# Patient Record
Sex: Female | Born: 1935 | Race: White | Hispanic: No | Marital: Married | Smoking: Never smoker
Health system: Southern US, Community
[De-identification: ages and names within clinical notes are randomized; demographics above are authoritative.]

## PROBLEM LIST (undated history)

## (undated) DIAGNOSIS — M1990 Unspecified osteoarthritis, unspecified site: Secondary | ICD-10-CM

## (undated) DIAGNOSIS — E119 Type 2 diabetes mellitus without complications: Secondary | ICD-10-CM

## (undated) DIAGNOSIS — E039 Hypothyroidism, unspecified: Secondary | ICD-10-CM

## (undated) DIAGNOSIS — I10 Essential (primary) hypertension: Secondary | ICD-10-CM

## (undated) DIAGNOSIS — J45909 Unspecified asthma, uncomplicated: Secondary | ICD-10-CM

## (undated) HISTORY — PX: OTHER SURGICAL HISTORY: SHX169

## (undated) HISTORY — DX: Unspecified osteoarthritis, unspecified site: M19.90

---

## 2002-02-18 ENCOUNTER — Encounter: Payer: Self-pay | Admitting: Neurosurgery

## 2002-02-24 ENCOUNTER — Encounter: Payer: Self-pay | Admitting: Neurosurgery

## 2002-02-24 ENCOUNTER — Inpatient Hospital Stay (HOSPITAL_COMMUNITY): Admission: RE | Admit: 2002-02-24 | Discharge: 2002-02-26 | Payer: Self-pay | Admitting: Neurosurgery

## 2002-04-08 ENCOUNTER — Encounter: Admission: RE | Admit: 2002-04-08 | Discharge: 2002-04-08 | Payer: Self-pay | Admitting: Neurosurgery

## 2002-04-08 ENCOUNTER — Encounter: Payer: Self-pay | Admitting: Neurosurgery

## 2002-05-17 ENCOUNTER — Encounter: Payer: Self-pay | Admitting: Unknown Physician Specialty

## 2002-05-17 ENCOUNTER — Encounter: Admission: RE | Admit: 2002-05-17 | Discharge: 2002-05-17 | Payer: Self-pay | Admitting: Unknown Physician Specialty

## 2002-05-18 ENCOUNTER — Encounter: Admission: RE | Admit: 2002-05-18 | Discharge: 2002-05-18 | Payer: Self-pay | Admitting: Neurosurgery

## 2002-05-18 ENCOUNTER — Encounter: Payer: Self-pay | Admitting: Neurosurgery

## 2004-11-15 ENCOUNTER — Encounter: Payer: Self-pay | Admitting: Podiatry

## 2004-12-06 ENCOUNTER — Encounter: Payer: Self-pay | Admitting: Podiatry

## 2005-05-21 ENCOUNTER — Ambulatory Visit: Payer: Self-pay | Admitting: Unknown Physician Specialty

## 2005-07-27 ENCOUNTER — Ambulatory Visit: Payer: Self-pay | Admitting: General Practice

## 2005-07-27 ENCOUNTER — Other Ambulatory Visit: Payer: Self-pay

## 2005-08-02 ENCOUNTER — Ambulatory Visit: Payer: Self-pay | Admitting: General Practice

## 2005-11-29 ENCOUNTER — Ambulatory Visit: Payer: Self-pay | Admitting: Unknown Physician Specialty

## 2006-05-30 ENCOUNTER — Ambulatory Visit: Payer: Self-pay | Admitting: Unknown Physician Specialty

## 2006-06-10 ENCOUNTER — Ambulatory Visit: Payer: Self-pay | Admitting: Unknown Physician Specialty

## 2006-07-02 ENCOUNTER — Encounter: Admission: RE | Admit: 2006-07-02 | Discharge: 2006-07-02 | Payer: Self-pay | Admitting: Neurosurgery

## 2006-08-19 ENCOUNTER — Ambulatory Visit: Payer: Self-pay | Admitting: Internal Medicine

## 2006-12-18 ENCOUNTER — Ambulatory Visit: Payer: Self-pay | Admitting: Pain Medicine

## 2006-12-19 ENCOUNTER — Ambulatory Visit: Payer: Self-pay | Admitting: Pain Medicine

## 2006-12-30 ENCOUNTER — Ambulatory Visit: Payer: Self-pay | Admitting: Physician Assistant

## 2007-01-07 ENCOUNTER — Ambulatory Visit: Payer: Self-pay | Admitting: Physician Assistant

## 2007-03-10 ENCOUNTER — Encounter: Admission: RE | Admit: 2007-03-10 | Discharge: 2007-03-10 | Payer: Self-pay | Admitting: Internal Medicine

## 2007-03-19 ENCOUNTER — Ambulatory Visit: Payer: Self-pay | Admitting: Unknown Physician Specialty

## 2007-03-19 ENCOUNTER — Other Ambulatory Visit: Payer: Self-pay

## 2007-03-25 ENCOUNTER — Inpatient Hospital Stay: Payer: Self-pay | Admitting: Unknown Physician Specialty

## 2007-03-27 ENCOUNTER — Other Ambulatory Visit: Payer: Self-pay

## 2007-06-04 ENCOUNTER — Ambulatory Visit: Payer: Self-pay | Admitting: Unknown Physician Specialty

## 2008-06-08 ENCOUNTER — Ambulatory Visit: Payer: Self-pay | Admitting: Unknown Physician Specialty

## 2008-06-22 ENCOUNTER — Emergency Department: Payer: Self-pay | Admitting: Emergency Medicine

## 2008-07-05 ENCOUNTER — Ambulatory Visit: Payer: Self-pay | Admitting: Physician Assistant

## 2008-07-16 ENCOUNTER — Other Ambulatory Visit: Payer: Self-pay

## 2008-07-16 ENCOUNTER — Ambulatory Visit: Payer: Self-pay | Admitting: Unknown Physician Specialty

## 2008-07-22 ENCOUNTER — Inpatient Hospital Stay: Payer: Self-pay | Admitting: Unknown Physician Specialty

## 2008-11-23 ENCOUNTER — Ambulatory Visit: Payer: Self-pay | Admitting: Unknown Physician Specialty

## 2008-12-02 ENCOUNTER — Ambulatory Visit: Payer: Self-pay | Admitting: Unknown Physician Specialty

## 2008-12-06 ENCOUNTER — Inpatient Hospital Stay: Payer: Self-pay | Admitting: Unknown Physician Specialty

## 2009-06-15 ENCOUNTER — Ambulatory Visit: Payer: Self-pay | Admitting: Internal Medicine

## 2009-07-20 ENCOUNTER — Ambulatory Visit: Payer: Self-pay | Admitting: Ophthalmology

## 2010-06-16 ENCOUNTER — Ambulatory Visit: Payer: Self-pay | Admitting: Internal Medicine

## 2011-06-29 ENCOUNTER — Ambulatory Visit: Payer: Self-pay | Admitting: Internal Medicine

## 2011-08-29 ENCOUNTER — Emergency Department: Payer: Self-pay | Admitting: *Deleted

## 2012-03-08 ENCOUNTER — Emergency Department: Payer: Self-pay | Admitting: Emergency Medicine

## 2012-07-03 ENCOUNTER — Ambulatory Visit: Payer: Self-pay | Admitting: Internal Medicine

## 2012-08-02 ENCOUNTER — Emergency Department: Payer: Self-pay | Admitting: Emergency Medicine

## 2012-08-03 ENCOUNTER — Inpatient Hospital Stay: Payer: Self-pay | Admitting: Internal Medicine

## 2012-08-03 LAB — TROPONIN I: Troponin-I: <0.02 ng/mL

## 2012-08-03 LAB — CBC
HCT: 40 % (ref 35.0–47.0)
HCT: 38.8 % (ref 35.0–47.0)
HGB: 13.2 g/dL (ref 12.0–16.0)
HGB: 12.9 g/dL (ref 12.0–16.0)
MCHC: 33 g/dL (ref 32.0–36.0)
MCHC: 33.2 g/dL (ref 32.0–36.0)
RBC: 4.22 10*6/uL (ref 3.80–5.20)
RBC: 4.08 10*6/uL (ref 3.80–5.20)
WBC: 11.1 10*3/uL — ABNORMAL HIGH (ref 3.6–11.0)
WBC: 9.6 10*3/uL (ref 3.6–11.0)

## 2012-08-03 LAB — LIPASE, BLOOD: Lipase: 92 U/L (ref 73–393)

## 2012-08-03 LAB — PROTIME-INR
INR: 1
Prothrombin Time: 13.7 secs (ref 11.5–14.7)

## 2012-08-03 LAB — COMPREHENSIVE METABOLIC PANEL
Anion Gap: 9 (ref 7–16)
BUN: 31 mg/dL — ABNORMAL HIGH (ref 7–18)
Bilirubin,Total: 0.4 mg/dL (ref 0.2–1.0)
Chloride: 101 mmol/L (ref 98–107)
Creatinine: 1.64 mg/dL — ABNORMAL HIGH (ref 0.60–1.30)
Osmolality: 284 (ref 275–301)
Potassium: 4.7 mmol/L (ref 3.5–5.1)
Total Protein: 7.6 g/dL (ref 6.4–8.2)

## 2012-08-03 LAB — CK TOTAL AND CKMB (NOT AT ARMC): CK-MB: 1.2 ng/mL (ref 0.5–3.6)

## 2012-08-04 LAB — CBC WITH DIFFERENTIAL/PLATELET
Basophil %: 0.6 %
HCT: 34.6 % — ABNORMAL LOW (ref 35.0–47.0)
HGB: 11.7 g/dL — ABNORMAL LOW (ref 12.0–16.0)
Lymphocyte %: 25.3 %
MCHC: 34 g/dL (ref 32.0–36.0)
MCV: 95 fL (ref 80–100)
Monocyte %: 7.7 %
Neutrophil #: 5 10*3/uL (ref 1.4–6.5)
WBC: 7.9 10*3/uL (ref 3.6–11.0)

## 2012-08-04 LAB — URINALYSIS, COMPLETE
Bilirubin,UR: NEGATIVE
Glucose,UR: NEGATIVE mg/dL (ref 0–75)
Hyaline Cast: 7
Nitrite: NEGATIVE
RBC,UR: 2 /HPF (ref 0–5)
Squamous Epithelial: 5
WBC UR: 4 /HPF (ref 0–5)

## 2012-08-04 LAB — BASIC METABOLIC PANEL
BUN: 25 mg/dL — ABNORMAL HIGH (ref 7–18)
Creatinine: 1.23 mg/dL (ref 0.60–1.30)
EGFR (African American): 46 — ABNORMAL LOW
EGFR (Non-African Amer.): 40 — ABNORMAL LOW
Glucose: 128 mg/dL — ABNORMAL HIGH (ref 65–99)
Sodium: 144 mmol/L (ref 136–145)

## 2012-08-04 LAB — HEMOGLOBIN A1C: Hemoglobin A1C: 6.8 % — ABNORMAL HIGH (ref 4.2–6.3)

## 2012-08-05 LAB — BASIC METABOLIC PANEL
BUN: 17 mg/dL (ref 7–18)
Chloride: 111 mmol/L — ABNORMAL HIGH (ref 98–107)
Creatinine: 0.98 mg/dL (ref 0.60–1.30)
Potassium: 3.5 mmol/L (ref 3.5–5.1)

## 2012-08-05 LAB — CBC WITH DIFFERENTIAL/PLATELET
Basophil %: 0.7 %
Eosinophil %: 3.7 %
HGB: 11.2 g/dL — ABNORMAL LOW (ref 12.0–16.0)
MCH: 31.2 pg (ref 26.0–34.0)
MCV: 95 fL (ref 80–100)
Monocyte #: 0.4 x10 3/mm (ref 0.2–0.9)
Neutrophil %: 72.5 %
RBC: 3.59 10*6/uL — ABNORMAL LOW (ref 3.80–5.20)

## 2012-08-06 LAB — CBC WITH DIFFERENTIAL/PLATELET
Basophil #: 0.1 10*3/uL (ref 0.0–0.1)
Eosinophil %: 4.7 %
Lymphocyte #: 1.7 10*3/uL (ref 1.0–3.6)
MCH: 32.1 pg (ref 26.0–34.0)
MCV: 95 fL (ref 80–100)
Monocyte #: 0.5 x10 3/mm (ref 0.2–0.9)
Neutrophil %: 61.5 %
Platelet: 162 10*3/uL (ref 150–440)
RDW: 13.1 % (ref 11.5–14.5)

## 2012-08-06 LAB — BASIC METABOLIC PANEL
Anion Gap: 10 (ref 7–16)
Calcium, Total: 8 mg/dL — ABNORMAL LOW (ref 8.5–10.1)
Co2: 21 mmol/L (ref 21–32)
Creatinine: 0.88 mg/dL (ref 0.60–1.30)
EGFR (African American): >60
Osmolality: 283 (ref 275–301)

## 2012-08-06 LAB — CLOSTRIDIUM DIFFICILE BY PCR

## 2012-08-07 LAB — BASIC METABOLIC PANEL
BUN: 7 mg/dL (ref 7–18)
Co2: 22 mmol/L (ref 21–32)
Creatinine: 0.98 mg/dL (ref 0.60–1.30)
EGFR (African American): >60
EGFR (Non-African Amer.): 52 — ABNORMAL LOW
Potassium: 3.5 mmol/L (ref 3.5–5.1)
Sodium: 147 mmol/L — ABNORMAL HIGH (ref 136–145)

## 2012-08-07 LAB — WBCS, STOOL

## 2012-08-07 LAB — CBC WITH DIFFERENTIAL/PLATELET
Basophil #: 0.1 10*3/uL (ref 0.0–0.1)
Eosinophil %: 4.6 %
HCT: 35 % (ref 35.0–47.0)
HGB: 11.8 g/dL — ABNORMAL LOW (ref 12.0–16.0)
Lymphocyte %: 31.4 %
Monocyte %: 8.5 %
Neutrophil #: 3.9 10*3/uL (ref 1.4–6.5)
RBC: 3.75 10*6/uL — ABNORMAL LOW (ref 3.80–5.20)
RDW: 13.2 % (ref 11.5–14.5)
WBC: 7.1 10*3/uL (ref 3.6–11.0)

## 2013-02-27 ENCOUNTER — Inpatient Hospital Stay: Payer: Self-pay | Admitting: Internal Medicine

## 2013-02-27 LAB — OCCULT BLOOD X 1 CARD TO LAB, STOOL: Occult Blood, Feces: POSITIVE

## 2013-02-27 LAB — CBC
HCT: 35.2 % (ref 35.0–47.0)
MCH: 30.8 pg (ref 26.0–34.0)
MCHC: 32.8 g/dL (ref 32.0–36.0)
MCHC: 32 g/dL (ref 32.0–36.0)
MCV: 96 fL (ref 80–100)
RBC: 3.37 10*6/uL — ABNORMAL LOW (ref 3.80–5.20)
RDW: 13.5 % (ref 11.5–14.5)
WBC: 11.5 10*3/uL — ABNORMAL HIGH (ref 3.6–11.0)
WBC: 9 10*3/uL (ref 3.6–11.0)

## 2013-02-27 LAB — COMPREHENSIVE METABOLIC PANEL
Albumin: 3.3 g/dL — ABNORMAL LOW (ref 3.4–5.0)
Alkaline Phosphatase: 55 U/L (ref 50–136)
BUN: 44 mg/dL — ABNORMAL HIGH (ref 7–18)
Calcium, Total: 9.1 mg/dL (ref 8.5–10.1)
Creatinine: 2.33 mg/dL — ABNORMAL HIGH (ref 0.60–1.30)
EGFR (Non-African Amer.): 18 — ABNORMAL LOW
Potassium: 4.5 mmol/L (ref 3.5–5.1)
SGOT(AST): 19 U/L (ref 15–37)
SGPT (ALT): 19 U/L (ref 12–78)

## 2013-02-27 LAB — URINALYSIS, COMPLETE
Blood: NEGATIVE
Glucose,UR: NEGATIVE mg/dL (ref 0–75)
Leukocyte Esterase: NEGATIVE
Nitrite: NEGATIVE
Ph: 5 (ref 4.5–8.0)
Protein: NEGATIVE
RBC,UR: 1 /HPF (ref 0–5)
WBC UR: 1 /HPF (ref 0–5)

## 2013-02-27 LAB — TROPONIN I: Troponin-I: <0.02 ng/mL

## 2013-02-27 LAB — CLOSTRIDIUM DIFFICILE BY PCR

## 2013-02-28 LAB — CBC WITH DIFFERENTIAL/PLATELET
Basophil #: 0 10*3/uL (ref 0.0–0.1)
HCT: 32.2 % — ABNORMAL LOW (ref 35.0–47.0)
HGB: 10.4 g/dL — ABNORMAL LOW (ref 12.0–16.0)
Lymphocyte #: 1.8 10*3/uL (ref 1.0–3.6)
Lymphocyte %: 21.5 %
MCH: 31 pg (ref 26.0–34.0)
MCHC: 32.2 g/dL (ref 32.0–36.0)
Monocyte %: 6 %
Neutrophil %: 67.6 %
RBC: 3.34 10*6/uL — ABNORMAL LOW (ref 3.80–5.20)
RDW: 13 % (ref 11.5–14.5)

## 2013-02-28 LAB — BASIC METABOLIC PANEL
Anion Gap: 9 (ref 7–16)
BUN: 23 mg/dL — ABNORMAL HIGH (ref 7–18)
Chloride: 112 mmol/L — ABNORMAL HIGH (ref 98–107)
Co2: 21 mmol/L (ref 21–32)
Potassium: 4.2 mmol/L (ref 3.5–5.1)
Sodium: 142 mmol/L (ref 136–145)

## 2013-02-28 LAB — TSH: Thyroid Stimulating Horm: 0.697 u[IU]/mL

## 2013-02-28 LAB — HEMOGLOBIN: HGB: 10.9 g/dL — ABNORMAL LOW (ref 12.0–16.0)

## 2013-02-28 LAB — TROPONIN I: Troponin-I: <0.02 ng/mL

## 2013-03-01 LAB — CBC WITH DIFFERENTIAL/PLATELET
Eosinophil #: 0.5 10*3/uL (ref 0.0–0.7)
HCT: 30.9 % — ABNORMAL LOW (ref 35.0–47.0)
HGB: 10.2 g/dL — ABNORMAL LOW (ref 12.0–16.0)
Lymphocyte #: 1.8 10*3/uL (ref 1.0–3.6)
Lymphocyte %: 28.9 %
MCHC: 32.9 g/dL (ref 32.0–36.0)
MCV: 95 fL (ref 80–100)
Monocyte #: 0.5 x10 3/mm (ref 0.2–0.9)
RBC: 3.24 10*6/uL — ABNORMAL LOW (ref 3.80–5.20)
WBC: 6.2 10*3/uL (ref 3.6–11.0)

## 2013-03-01 LAB — BASIC METABOLIC PANEL
Anion Gap: 6 — ABNORMAL LOW (ref 7–16)
Co2: 23 mmol/L (ref 21–32)
EGFR (Non-African Amer.): >60
Glucose: 116 mg/dL — ABNORMAL HIGH (ref 65–99)

## 2013-03-02 LAB — CBC WITH DIFFERENTIAL/PLATELET
Basophil #: 0 10*3/uL (ref 0.0–0.1)
Basophil %: 0.7 %
Eosinophil %: 8.2 %
HGB: 10.9 g/dL — ABNORMAL LOW (ref 12.0–16.0)
Lymphocyte #: 1.3 10*3/uL (ref 1.0–3.6)
MCV: 95 fL (ref 80–100)
Monocyte #: 0.6 x10 3/mm (ref 0.2–0.9)
Neutrophil #: 3.5 10*3/uL (ref 1.4–6.5)
Neutrophil %: 59.3 %
Platelet: 156 10*3/uL (ref 150–440)
WBC: 5.9 10*3/uL (ref 3.6–11.0)

## 2013-03-02 LAB — BASIC METABOLIC PANEL
BUN: 11 mg/dL (ref 7–18)
Calcium, Total: 8.5 mg/dL (ref 8.5–10.1)
Co2: 25 mmol/L (ref 21–32)
EGFR (Non-African Amer.): >60
Osmolality: 287 (ref 275–301)
Potassium: 4.1 mmol/L (ref 3.5–5.1)
Sodium: 144 mmol/L (ref 136–145)

## 2013-03-05 LAB — CULTURE, BLOOD (SINGLE)

## 2013-07-08 ENCOUNTER — Ambulatory Visit: Payer: Self-pay | Admitting: Internal Medicine

## 2013-07-17 ENCOUNTER — Ambulatory Visit: Payer: Self-pay | Admitting: Internal Medicine

## 2013-11-19 IMAGING — CR FACIAL BONES - 1-2 VIEW
1 series · 3 of 3 positions shown · non-contrast
Comparison: none

REASON FOR EXAM: pain & swelling
COMMENTS:   May transport without cardiac monitor

PROCEDURE:     DXR - DXR FACIAL BONES LIMITED  - August 02, 2012 [DATE]
RESULT:     Findings: 3 views of the left facial bones demonstrates no
fracture. The orbital floors are intact. There the paranasal sinuses are
clear.

[Series 1: waters · 0.17mm/px · 3 of 3 slices shown]
[im 1/3]
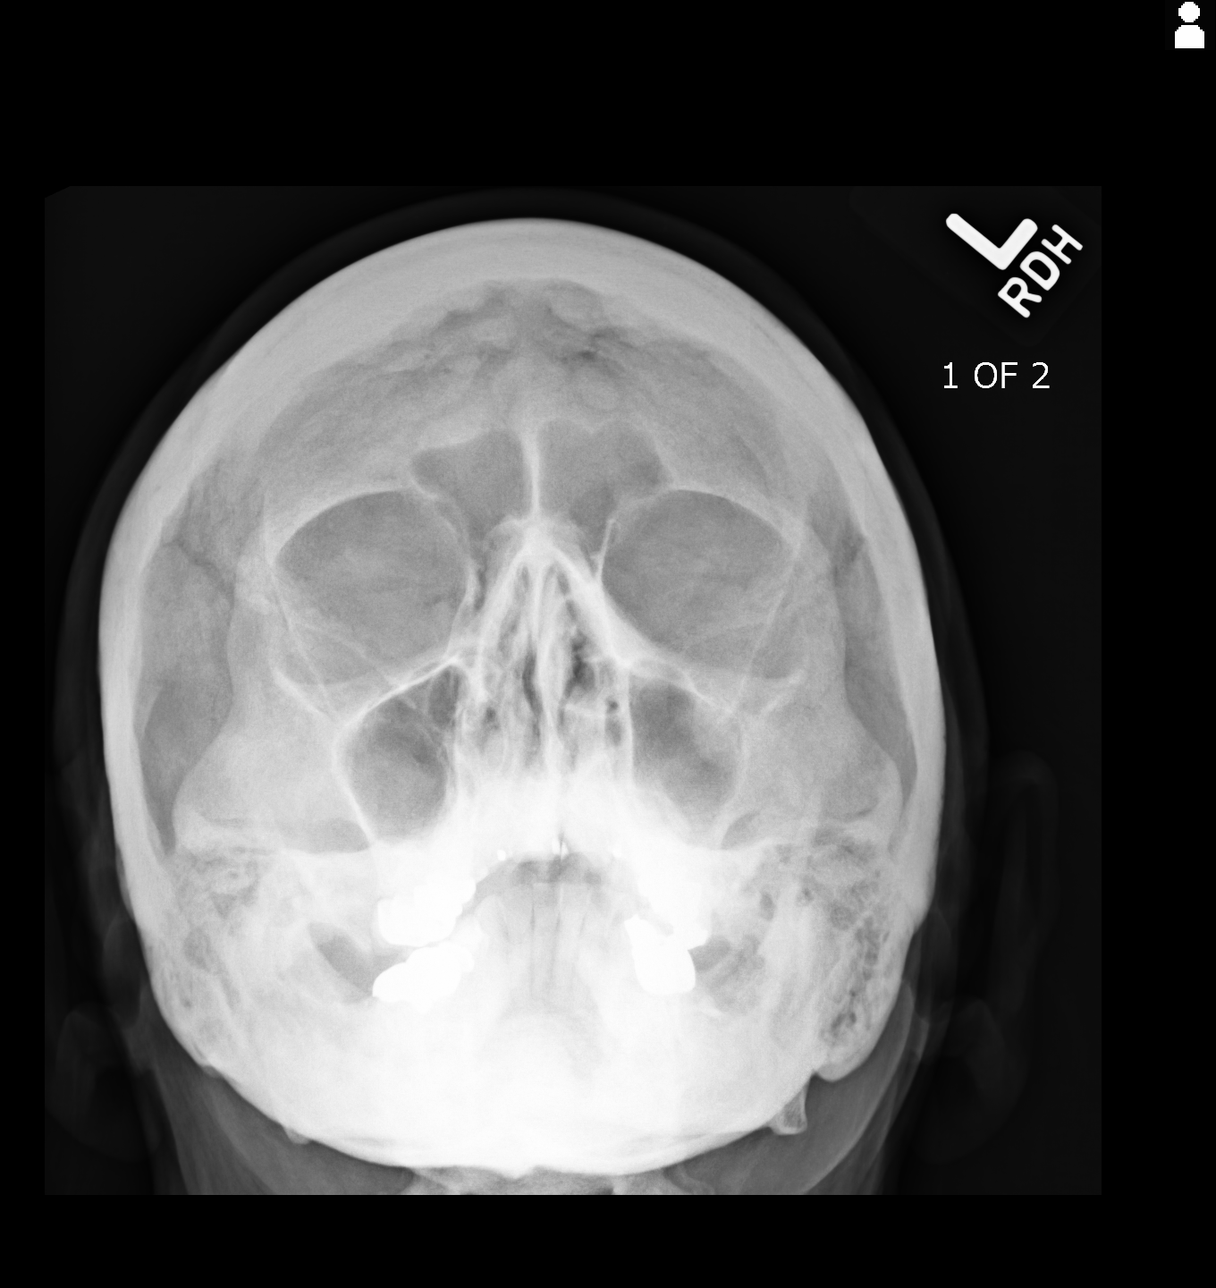
[im 2/3]
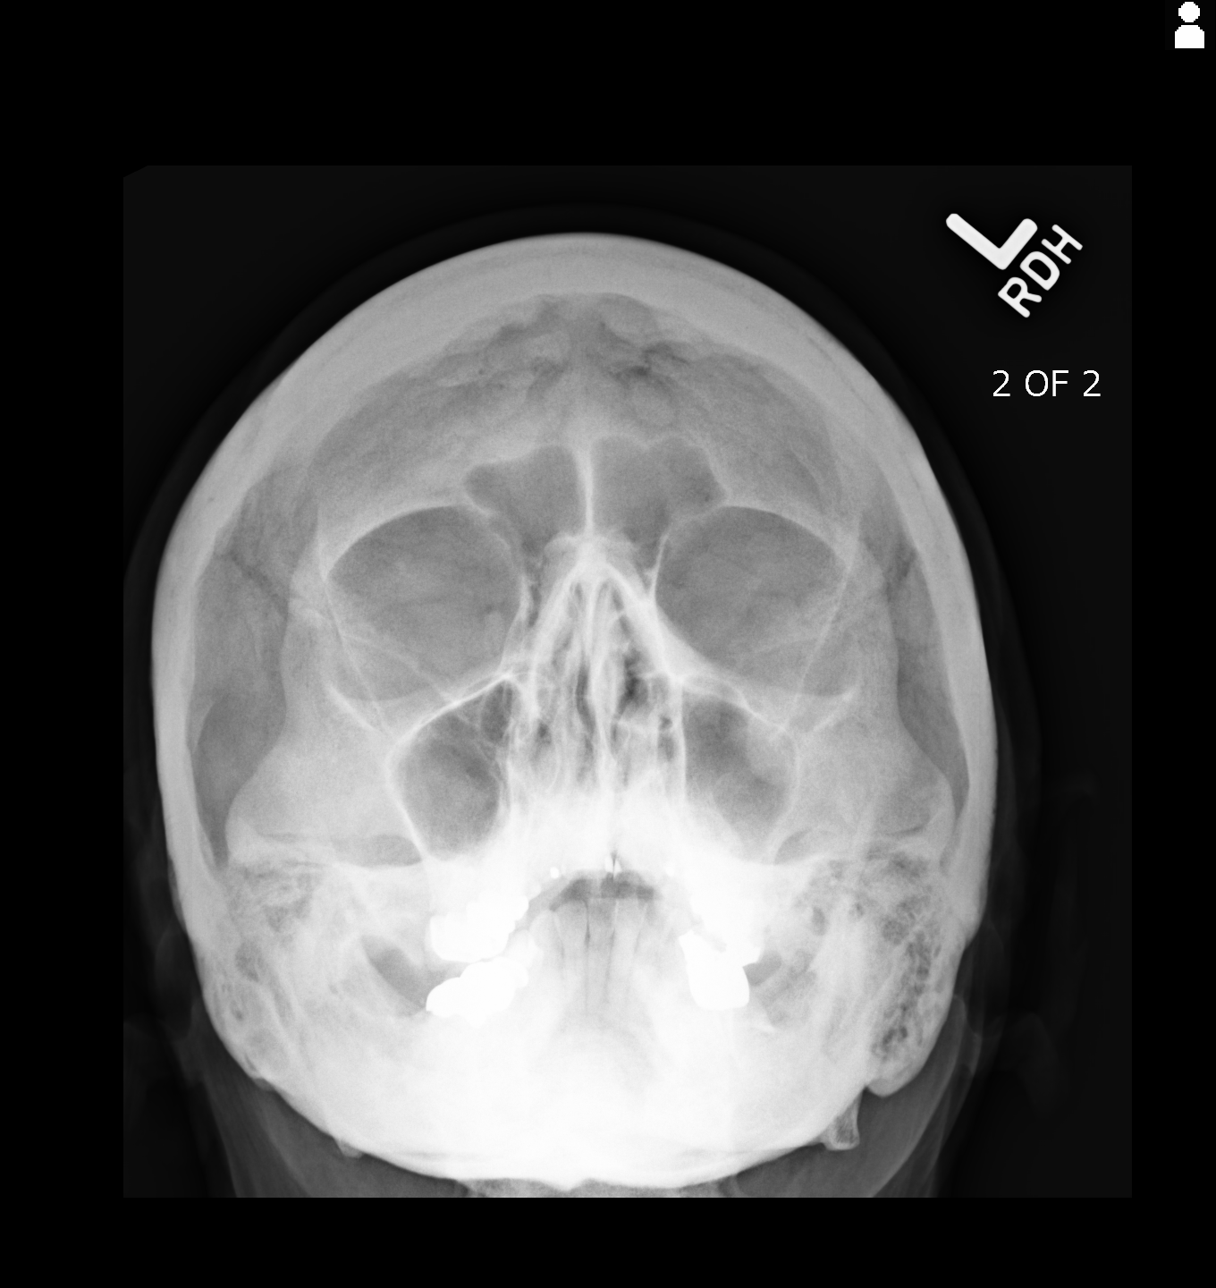
[im 3/3]
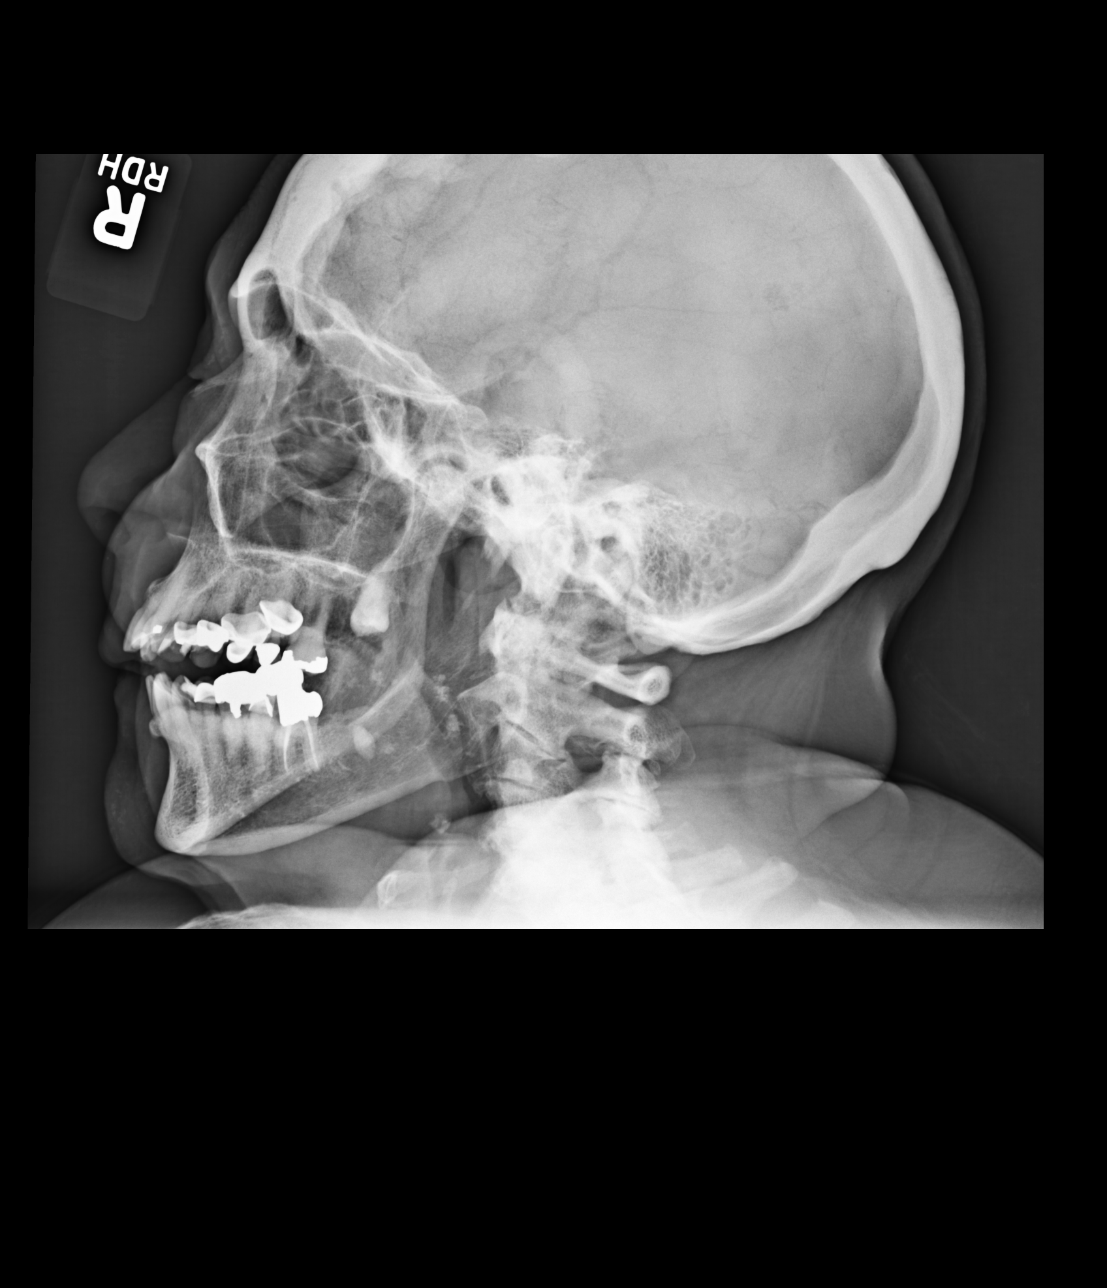

[3 of 3 positions shown; findings below may reference images not displayed]

IMPRESSION: No acute osseous injury of the facial bones. If there is further clinical
concern recommend CT of the maxillofacial bones.

## 2014-03-21 DIAGNOSIS — E785 Hyperlipidemia, unspecified: Secondary | ICD-10-CM | POA: Insufficient documentation

## 2014-03-21 DIAGNOSIS — E1122 Type 2 diabetes mellitus with diabetic chronic kidney disease: Secondary | ICD-10-CM | POA: Insufficient documentation

## 2014-03-21 DIAGNOSIS — N183 Chronic kidney disease, stage 3 unspecified: Secondary | ICD-10-CM | POA: Insufficient documentation

## 2014-03-21 DIAGNOSIS — E039 Hypothyroidism, unspecified: Secondary | ICD-10-CM | POA: Insufficient documentation

## 2014-08-09 ENCOUNTER — Inpatient Hospital Stay: Payer: Self-pay | Admitting: Internal Medicine

## 2014-08-09 LAB — URINALYSIS, COMPLETE
BILIRUBIN, UR: NEGATIVE
Bacteria: NONE SEEN
Blood: NEGATIVE
Glucose,UR: NEGATIVE mg/dL (ref 0–75)
Hyaline Cast: 22
Ketone: NEGATIVE
LEUKOCYTE ESTERASE: NEGATIVE
Nitrite: NEGATIVE
Ph: 5 (ref 4.5–8.0)
Protein: NEGATIVE
RBC,UR: 1 /HPF (ref 0–5)
Specific Gravity: 1.015 (ref 1.003–1.030)
Squamous Epithelial: NONE SEEN
WBC UR: NONE SEEN /HPF (ref 0–5)

## 2014-08-09 LAB — BASIC METABOLIC PANEL
Anion Gap: 10 (ref 7–16)
BUN: 39 mg/dL — ABNORMAL HIGH (ref 7–18)
CHLORIDE: 95 mmol/L — AB (ref 98–107)
Calcium, Total: 9 mg/dL (ref 8.5–10.1)
Co2: 26 mmol/L (ref 21–32)
Creatinine: 2.35 mg/dL — ABNORMAL HIGH (ref 0.60–1.30)
EGFR (Non-African Amer.): 21 — ABNORMAL LOW
GFR CALC AF AMER: 25 — AB
Glucose: 228 mg/dL — ABNORMAL HIGH (ref 65–99)
OSMOLALITY: 279 (ref 275–301)
POTASSIUM: 5 mmol/L (ref 3.5–5.1)
SODIUM: 131 mmol/L — AB (ref 136–145)

## 2014-08-09 LAB — TROPONIN I
Troponin-I: <0.02 ng/mL
Troponin-I: <0.02 ng/mL

## 2014-08-09 LAB — CK TOTAL AND CKMB (NOT AT ARMC)
CK, Total: 44 U/L
CK, Total: 43 U/L
CK-MB: 1 ng/mL (ref 0.5–3.6)
CK-MB: 1.2 ng/mL (ref 0.5–3.6)

## 2014-08-09 LAB — CBC
HCT: 39.4 % (ref 35.0–47.0)
HGB: 13.1 g/dL (ref 12.0–16.0)
MCH: 32 pg (ref 26.0–34.0)
MCHC: 33.2 g/dL (ref 32.0–36.0)
MCV: 97 fL (ref 80–100)
Platelet: 189 10*3/uL (ref 150–440)
RBC: 4.08 10*6/uL (ref 3.80–5.20)
RDW: 13 % (ref 11.5–14.5)
WBC: 10.4 10*3/uL (ref 3.6–11.0)

## 2014-08-10 LAB — CBC WITH DIFFERENTIAL/PLATELET
BASOS ABS: 0 10*3/uL (ref 0.0–0.1)
BASOS PCT: 0.2 %
Eosinophil #: 0 10*3/uL (ref 0.0–0.7)
Eosinophil %: 0.2 %
HCT: 31.9 % — AB (ref 35.0–47.0)
HGB: 10.3 g/dL — ABNORMAL LOW (ref 12.0–16.0)
LYMPHS ABS: 1.2 10*3/uL (ref 1.0–3.6)
LYMPHS PCT: 15.2 %
MCH: 31.4 pg (ref 26.0–34.0)
MCHC: 32.2 g/dL (ref 32.0–36.0)
MCV: 98 fL (ref 80–100)
Monocyte #: 0.6 x10 3/mm (ref 0.2–0.9)
Monocyte %: 6.9 %
NEUTROS PCT: 77.5 %
Neutrophil #: 6.4 10*3/uL (ref 1.4–6.5)
Platelet: 130 10*3/uL — ABNORMAL LOW (ref 150–440)
RBC: 3.26 10*6/uL — ABNORMAL LOW (ref 3.80–5.20)
RDW: 13.4 % (ref 11.5–14.5)
WBC: 8.2 10*3/uL (ref 3.6–11.0)

## 2014-08-10 LAB — BASIC METABOLIC PANEL
ANION GAP: 5 — AB (ref 7–16)
BUN: 19 mg/dL — ABNORMAL HIGH (ref 7–18)
CALCIUM: 6.8 mg/dL — AB (ref 8.5–10.1)
CO2: 24 mmol/L (ref 21–32)
Chloride: 112 mmol/L — ABNORMAL HIGH (ref 98–107)
Creatinine: 1.28 mg/dL (ref 0.60–1.30)
EGFR (African American): 51 — ABNORMAL LOW
EGFR (Non-African Amer.): 42 — ABNORMAL LOW
Glucose: 185 mg/dL — ABNORMAL HIGH (ref 65–99)
Osmolality: 288 (ref 275–301)
Potassium: 3.8 mmol/L (ref 3.5–5.1)
Sodium: 141 mmol/L (ref 136–145)

## 2014-08-10 LAB — CLOSTRIDIUM DIFFICILE(ARMC)

## 2014-08-10 LAB — ALBUMIN: Albumin: 2.1 g/dL — ABNORMAL LOW (ref 3.4–5.0)

## 2014-08-10 LAB — TSH: Thyroid Stimulating Horm: 0.22 u[IU]/mL — ABNORMAL LOW

## 2014-08-11 LAB — BASIC METABOLIC PANEL
Anion Gap: 4 — ABNORMAL LOW (ref 7–16)
BUN: 10 mg/dL (ref 7–18)
CO2: 24 mmol/L (ref 21–32)
CREATININE: 0.95 mg/dL (ref 0.60–1.30)
Calcium, Total: 6.7 mg/dL — CL (ref 8.5–10.1)
Chloride: 115 mmol/L — ABNORMAL HIGH (ref 98–107)
EGFR (Non-African Amer.): 59 — ABNORMAL LOW
Glucose: 147 mg/dL — ABNORMAL HIGH (ref 65–99)
Osmolality: 287 (ref 275–301)
POTASSIUM: 3.3 mmol/L — AB (ref 3.5–5.1)
Sodium: 143 mmol/L (ref 136–145)

## 2014-08-11 LAB — CBC WITH DIFFERENTIAL/PLATELET
Basophil #: 0 10*3/uL (ref 0.0–0.1)
Basophil %: 0.3 %
EOS ABS: 0.2 10*3/uL (ref 0.0–0.7)
Eosinophil %: 3.5 %
HCT: 31.1 % — AB (ref 35.0–47.0)
HGB: 10.1 g/dL — AB (ref 12.0–16.0)
LYMPHS ABS: 1 10*3/uL (ref 1.0–3.6)
Lymphocyte %: 18 %
MCH: 31.7 pg (ref 26.0–34.0)
MCHC: 32.4 g/dL (ref 32.0–36.0)
MCV: 98 fL (ref 80–100)
MONOS PCT: 6.4 %
Monocyte #: 0.4 x10 3/mm (ref 0.2–0.9)
NEUTROS PCT: 71.8 %
Neutrophil #: 4.2 10*3/uL (ref 1.4–6.5)
PLATELETS: 114 10*3/uL — AB (ref 150–440)
RBC: 3.17 10*6/uL — ABNORMAL LOW (ref 3.80–5.20)
RDW: 13.6 % (ref 11.5–14.5)
WBC: 5.8 10*3/uL (ref 3.6–11.0)

## 2014-08-12 LAB — CBC WITH DIFFERENTIAL/PLATELET
BASOS ABS: 0 10*3/uL (ref 0.0–0.1)
BASOS PCT: 0.3 %
Eosinophil #: 0.2 10*3/uL (ref 0.0–0.7)
Eosinophil %: 3.6 %
HCT: 33.3 % — AB (ref 35.0–47.0)
HGB: 11.1 g/dL — ABNORMAL LOW (ref 12.0–16.0)
LYMPHS PCT: 12.7 %
Lymphocyte #: 0.8 10*3/uL — ABNORMAL LOW (ref 1.0–3.6)
MCH: 32 pg (ref 26.0–34.0)
MCHC: 33.3 g/dL (ref 32.0–36.0)
MCV: 96 fL (ref 80–100)
MONOS PCT: 7.8 %
Monocyte #: 0.5 x10 3/mm (ref 0.2–0.9)
Neutrophil #: 4.7 10*3/uL (ref 1.4–6.5)
Neutrophil %: 75.6 %
Platelet: 152 10*3/uL (ref 150–440)
RBC: 3.46 10*6/uL — ABNORMAL LOW (ref 3.80–5.20)
RDW: 13.3 % (ref 11.5–14.5)
WBC: 6.2 10*3/uL (ref 3.6–11.0)

## 2014-08-12 LAB — BASIC METABOLIC PANEL
Anion Gap: 9 (ref 7–16)
BUN: 5 mg/dL — ABNORMAL LOW (ref 7–18)
CALCIUM: 7 mg/dL — AB (ref 8.5–10.1)
CHLORIDE: 112 mmol/L — AB (ref 98–107)
CO2: 24 mmol/L (ref 21–32)
Creatinine: 0.81 mg/dL (ref 0.60–1.30)
EGFR (African American): >60
EGFR (Non-African Amer.): >60
Glucose: 149 mg/dL — ABNORMAL HIGH (ref 65–99)
OSMOLALITY: 289 (ref 275–301)
Potassium: 3.5 mmol/L (ref 3.5–5.1)
SODIUM: 145 mmol/L (ref 136–145)

## 2014-08-13 LAB — CBC WITH DIFFERENTIAL/PLATELET
BASOS ABS: 0 10*3/uL (ref 0.0–0.1)
Basophil %: 0.3 %
EOS ABS: 0.3 10*3/uL (ref 0.0–0.7)
EOS PCT: 3.4 %
HCT: 34.5 % — AB (ref 35.0–47.0)
HGB: 11.4 g/dL — AB (ref 12.0–16.0)
LYMPHS ABS: 1 10*3/uL (ref 1.0–3.6)
LYMPHS PCT: 13 %
MCH: 31.5 pg (ref 26.0–34.0)
MCHC: 32.9 g/dL (ref 32.0–36.0)
MCV: 96 fL (ref 80–100)
MONO ABS: 0.6 x10 3/mm (ref 0.2–0.9)
Monocyte %: 8.7 %
NEUTROS ABS: 5.5 10*3/uL (ref 1.4–6.5)
NEUTROS PCT: 74.6 %
Platelet: 202 10*3/uL (ref 150–440)
RBC: 3.61 10*6/uL — ABNORMAL LOW (ref 3.80–5.20)
RDW: 13.1 % (ref 11.5–14.5)
WBC: 7.3 10*3/uL (ref 3.6–11.0)

## 2014-08-13 LAB — BASIC METABOLIC PANEL
Anion Gap: 9 (ref 7–16)
BUN: 4 mg/dL — AB (ref 7–18)
CHLORIDE: 107 mmol/L (ref 98–107)
Calcium, Total: 7.2 mg/dL — ABNORMAL LOW (ref 8.5–10.1)
Co2: 27 mmol/L (ref 21–32)
Creatinine: 0.75 mg/dL (ref 0.60–1.30)
GLUCOSE: 154 mg/dL — AB (ref 65–99)
Osmolality: 285 (ref 275–301)
Potassium: 2.9 mmol/L — ABNORMAL LOW (ref 3.5–5.1)
Sodium: 143 mmol/L (ref 136–145)

## 2014-08-13 LAB — POTASSIUM: POTASSIUM: 3.6 mmol/L (ref 3.5–5.1)

## 2014-08-14 LAB — BASIC METABOLIC PANEL
Anion Gap: 8 (ref 7–16)
BUN: 4 mg/dL — ABNORMAL LOW (ref 7–18)
CHLORIDE: 106 mmol/L (ref 98–107)
CREATININE: 0.71 mg/dL (ref 0.60–1.30)
Calcium, Total: 7.1 mg/dL — ABNORMAL LOW (ref 8.5–10.1)
Co2: 29 mmol/L (ref 21–32)
EGFR (African American): >60
Glucose: 139 mg/dL — ABNORMAL HIGH (ref 65–99)
OSMOLALITY: 284 (ref 275–301)
Potassium: 3.3 mmol/L — ABNORMAL LOW (ref 3.5–5.1)
Sodium: 143 mmol/L (ref 136–145)

## 2014-08-14 LAB — CULTURE, BLOOD (SINGLE)

## 2014-08-15 LAB — CBC WITH DIFFERENTIAL/PLATELET
BASOS ABS: 0 10*3/uL (ref 0.0–0.1)
BASOS PCT: 0.4 %
EOS PCT: 5.5 %
Eosinophil #: 0.4 10*3/uL (ref 0.0–0.7)
HCT: 33.6 % — AB (ref 35.0–47.0)
HGB: 10.7 g/dL — ABNORMAL LOW (ref 12.0–16.0)
LYMPHS PCT: 17.5 %
Lymphocyte #: 1.3 10*3/uL (ref 1.0–3.6)
MCH: 30.5 pg (ref 26.0–34.0)
MCHC: 31.9 g/dL — AB (ref 32.0–36.0)
MCV: 96 fL (ref 80–100)
Monocyte #: 1 x10 3/mm — ABNORMAL HIGH (ref 0.2–0.9)
Monocyte %: 13.3 %
Neutrophil #: 4.8 10*3/uL (ref 1.4–6.5)
Neutrophil %: 63.3 %
Platelet: 228 10*3/uL (ref 150–440)
RBC: 3.5 10*6/uL — ABNORMAL LOW (ref 3.80–5.20)
RDW: 13.3 % (ref 11.5–14.5)
WBC: 7.6 10*3/uL (ref 3.6–11.0)

## 2014-08-15 LAB — BASIC METABOLIC PANEL
Anion Gap: 7 (ref 7–16)
BUN: 5 mg/dL — ABNORMAL LOW (ref 7–18)
CHLORIDE: 104 mmol/L (ref 98–107)
Calcium, Total: 7.3 mg/dL — ABNORMAL LOW (ref 8.5–10.1)
Co2: 29 mmol/L (ref 21–32)
Creatinine: 0.73 mg/dL (ref 0.60–1.30)
EGFR (African American): >60
Glucose: 147 mg/dL — ABNORMAL HIGH (ref 65–99)
OSMOLALITY: 279 (ref 275–301)
Potassium: 3.5 mmol/L (ref 3.5–5.1)
SODIUM: 140 mmol/L (ref 136–145)

## 2014-08-17 LAB — CBC WITH DIFFERENTIAL/PLATELET
Basophil #: 0 10*3/uL (ref 0.0–0.1)
Basophil %: 0.4 %
Eosinophil #: 0.3 10*3/uL (ref 0.0–0.7)
Eosinophil %: 3.5 %
HCT: 35.7 % (ref 35.0–47.0)
HGB: 11.4 g/dL — ABNORMAL LOW (ref 12.0–16.0)
LYMPHS ABS: 1.4 10*3/uL (ref 1.0–3.6)
Lymphocyte %: 14.3 %
MCH: 30.8 pg (ref 26.0–34.0)
MCHC: 32 g/dL (ref 32.0–36.0)
MCV: 96 fL (ref 80–100)
MONOS PCT: 8.5 %
Monocyte #: 0.8 x10 3/mm (ref 0.2–0.9)
NEUTROS ABS: 7 10*3/uL — AB (ref 1.4–6.5)
Neutrophil %: 73.3 %
PLATELETS: 298 10*3/uL (ref 150–440)
RBC: 3.71 10*6/uL — AB (ref 3.80–5.20)
RDW: 13.7 % (ref 11.5–14.5)
WBC: 9.5 10*3/uL (ref 3.6–11.0)

## 2014-08-17 LAB — BASIC METABOLIC PANEL
Anion Gap: 9 (ref 7–16)
BUN: 4 mg/dL — AB (ref 7–18)
CREATININE: 0.76 mg/dL (ref 0.60–1.30)
Calcium, Total: 7.6 mg/dL — ABNORMAL LOW (ref 8.5–10.1)
Chloride: 96 mmol/L — ABNORMAL LOW (ref 98–107)
Co2: 31 mmol/L (ref 21–32)
EGFR (African American): >60
Glucose: 156 mg/dL — ABNORMAL HIGH (ref 65–99)
Osmolality: 272 (ref 275–301)
Potassium: 3 mmol/L — ABNORMAL LOW (ref 3.5–5.1)
SODIUM: 136 mmol/L (ref 136–145)

## 2014-08-18 LAB — BASIC METABOLIC PANEL
Anion Gap: 11 (ref 7–16)
BUN: 7 mg/dL (ref 7–18)
CALCIUM: 7.4 mg/dL — AB (ref 8.5–10.1)
CHLORIDE: 96 mmol/L — AB (ref 98–107)
Co2: 29 mmol/L (ref 21–32)
Creatinine: 0.7 mg/dL (ref 0.60–1.30)
EGFR (African American): >60
GLUCOSE: 208 mg/dL — AB (ref 65–99)
OSMOLALITY: 276 (ref 275–301)
Potassium: 3.4 mmol/L — ABNORMAL LOW (ref 3.5–5.1)
SODIUM: 136 mmol/L (ref 136–145)

## 2014-09-10 ENCOUNTER — Observation Stay: Payer: Self-pay | Admitting: Internal Medicine

## 2014-09-10 LAB — COMPREHENSIVE METABOLIC PANEL
ALBUMIN: 2.3 g/dL — AB (ref 3.4–5.0)
ALK PHOS: 73 U/L
ANION GAP: 8 (ref 7–16)
AST: 18 U/L (ref 15–37)
BUN: 20 mg/dL — ABNORMAL HIGH (ref 7–18)
Bilirubin,Total: 0.3 mg/dL (ref 0.2–1.0)
CREATININE: 1.05 mg/dL (ref 0.60–1.30)
Calcium, Total: 7.9 mg/dL — ABNORMAL LOW (ref 8.5–10.1)
Chloride: 106 mmol/L (ref 98–107)
Co2: 29 mmol/L (ref 21–32)
EGFR (African American): >60
EGFR (Non-African Amer.): 53 — ABNORMAL LOW
GLUCOSE: 169 mg/dL — AB (ref 65–99)
Osmolality: 292 (ref 275–301)
POTASSIUM: 3.5 mmol/L (ref 3.5–5.1)
SGPT (ALT): 14 U/L
Sodium: 143 mmol/L (ref 136–145)
Total Protein: 5.6 g/dL — ABNORMAL LOW (ref 6.4–8.2)

## 2014-09-10 LAB — CBC WITH DIFFERENTIAL/PLATELET
BASOS ABS: 0.1 10*3/uL (ref 0.0–0.1)
BASOS PCT: 1.4 %
Eosinophil #: 1.1 10*3/uL — ABNORMAL HIGH (ref 0.0–0.7)
Eosinophil %: 11.5 %
HCT: 33.5 % — ABNORMAL LOW (ref 35.0–47.0)
HGB: 10.7 g/dL — ABNORMAL LOW (ref 12.0–16.0)
LYMPHS ABS: 2.6 10*3/uL (ref 1.0–3.6)
LYMPHS PCT: 26.1 %
MCH: 31.2 pg (ref 26.0–34.0)
MCHC: 32 g/dL (ref 32.0–36.0)
MCV: 97 fL (ref 80–100)
Monocyte #: 0.7 x10 3/mm (ref 0.2–0.9)
Monocyte %: 7.4 %
Neutrophil #: 5.3 10*3/uL (ref 1.4–6.5)
Neutrophil %: 53.6 %
Platelet: 232 10*3/uL (ref 150–440)
RBC: 3.44 10*6/uL — ABNORMAL LOW (ref 3.80–5.20)
RDW: 15.1 % — ABNORMAL HIGH (ref 11.5–14.5)
WBC: 9.9 10*3/uL (ref 3.6–11.0)

## 2014-09-10 LAB — URINALYSIS, COMPLETE
Bilirubin,UR: NEGATIVE
Glucose,UR: NEGATIVE mg/dL (ref 0–75)
KETONE: NEGATIVE
Nitrite: NEGATIVE
PH: 5 (ref 4.5–8.0)
Protein: 30
RBC,UR: 63 /HPF (ref 0–5)
Specific Gravity: 1.013 (ref 1.003–1.030)

## 2014-09-11 LAB — CBC WITH DIFFERENTIAL/PLATELET
BASOS ABS: 0.1 10*3/uL (ref 0.0–0.1)
BASOS PCT: 0.6 %
EOS PCT: 12.9 %
Eosinophil #: 1.1 10*3/uL — ABNORMAL HIGH (ref 0.0–0.7)
HCT: 31.6 % — AB (ref 35.0–47.0)
HGB: 10.3 g/dL — AB (ref 12.0–16.0)
LYMPHS ABS: 2.2 10*3/uL (ref 1.0–3.6)
Lymphocyte %: 24.4 %
MCH: 31.7 pg (ref 26.0–34.0)
MCHC: 32.6 g/dL (ref 32.0–36.0)
MCV: 97 fL (ref 80–100)
MONO ABS: 0.7 x10 3/mm (ref 0.2–0.9)
MONOS PCT: 8 %
Neutrophil #: 4.8 10*3/uL (ref 1.4–6.5)
Neutrophil %: 54.1 %
Platelet: 218 10*3/uL (ref 150–440)
RBC: 3.26 10*6/uL — ABNORMAL LOW (ref 3.80–5.20)
RDW: 14.8 % — ABNORMAL HIGH (ref 11.5–14.5)
WBC: 8.9 10*3/uL (ref 3.6–11.0)

## 2014-09-11 LAB — BASIC METABOLIC PANEL
ANION GAP: 6 — AB (ref 7–16)
BUN: 16 mg/dL (ref 7–18)
CHLORIDE: 107 mmol/L (ref 98–107)
Calcium, Total: 7.6 mg/dL — ABNORMAL LOW (ref 8.5–10.1)
Co2: 31 mmol/L (ref 21–32)
Creatinine: 1.01 mg/dL (ref 0.60–1.30)
EGFR (African American): >60
GFR CALC NON AF AMER: 55 — AB
Glucose: 167 mg/dL — ABNORMAL HIGH (ref 65–99)
Osmolality: 292 (ref 275–301)
Potassium: 3.5 mmol/L (ref 3.5–5.1)
SODIUM: 144 mmol/L (ref 136–145)

## 2014-09-14 LAB — BASIC METABOLIC PANEL
ANION GAP: 7 (ref 7–16)
BUN: 10 mg/dL (ref 7–18)
CALCIUM: 7.7 mg/dL — AB (ref 8.5–10.1)
CO2: 33 mmol/L — AB (ref 21–32)
Chloride: 101 mmol/L (ref 98–107)
Creatinine: 0.88 mg/dL (ref 0.60–1.30)
EGFR (Non-African Amer.): >60
GLUCOSE: 173 mg/dL — AB (ref 65–99)
Osmolality: 284 (ref 275–301)
Potassium: 3.8 mmol/L (ref 3.5–5.1)
SODIUM: 141 mmol/L (ref 136–145)

## 2014-09-14 LAB — CBC WITH DIFFERENTIAL/PLATELET
BASOS PCT: 0.9 %
Basophil #: 0.1 10*3/uL (ref 0.0–0.1)
EOS ABS: 1.3 10*3/uL — AB (ref 0.0–0.7)
Eosinophil %: 13.5 %
HCT: 32.1 % — AB (ref 35.0–47.0)
HGB: 10.3 g/dL — ABNORMAL LOW (ref 12.0–16.0)
LYMPHS ABS: 2.7 10*3/uL (ref 1.0–3.6)
Lymphocyte %: 28.7 %
MCH: 31 pg (ref 26.0–34.0)
MCHC: 32.2 g/dL (ref 32.0–36.0)
MCV: 96 fL (ref 80–100)
MONO ABS: 0.6 x10 3/mm (ref 0.2–0.9)
Monocyte %: 6.9 %
NEUTROS PCT: 50 %
Neutrophil #: 4.6 10*3/uL (ref 1.4–6.5)
Platelet: 256 10*3/uL (ref 150–440)
RBC: 3.33 10*6/uL — AB (ref 3.80–5.20)
RDW: 14.7 % — ABNORMAL HIGH (ref 11.5–14.5)
WBC: 9.3 10*3/uL (ref 3.6–11.0)

## 2014-09-15 LAB — BASIC METABOLIC PANEL
Anion Gap: 8 (ref 7–16)
BUN: 9 mg/dL (ref 7–18)
Calcium, Total: 7.8 mg/dL — ABNORMAL LOW (ref 8.5–10.1)
Chloride: 100 mmol/L (ref 98–107)
Co2: 34 mmol/L — ABNORMAL HIGH (ref 21–32)
Creatinine: 0.93 mg/dL (ref 0.60–1.30)
EGFR (Non-African Amer.): >60
GLUCOSE: 157 mg/dL — AB (ref 65–99)
Osmolality: 285 (ref 275–301)
Potassium: 3.2 mmol/L — ABNORMAL LOW (ref 3.5–5.1)
SODIUM: 142 mmol/L (ref 136–145)

## 2014-09-15 LAB — CULTURE, BLOOD (SINGLE)

## 2014-09-16 ENCOUNTER — Encounter: Payer: Self-pay | Admitting: Internal Medicine

## 2014-09-21 LAB — BASIC METABOLIC PANEL
ANION GAP: 9 (ref 7–16)
BUN: 18 mg/dL (ref 7–18)
CALCIUM: 8.6 mg/dL (ref 8.5–10.1)
Chloride: 101 mmol/L (ref 98–107)
Co2: 30 mmol/L (ref 21–32)
Creatinine: 1.27 mg/dL (ref 0.60–1.30)
EGFR (African American): 51 — ABNORMAL LOW
GFR CALC NON AF AMER: 42 — AB
GLUCOSE: 191 mg/dL — AB (ref 65–99)
OSMOLALITY: 286 (ref 275–301)
Potassium: 3.5 mmol/L (ref 3.5–5.1)
SODIUM: 140 mmol/L (ref 136–145)

## 2014-10-05 ENCOUNTER — Encounter: Payer: Self-pay | Admitting: Internal Medicine

## 2014-11-05 ENCOUNTER — Encounter: Payer: Self-pay | Admitting: Internal Medicine

## 2015-02-22 NOTE — Consult Note (Signed)
CC:  Abn CT scan.  Pt had CT showing circumferential thickening of the hepatic flexure, some focal mild wall thichening of splenic flex and descending colon.  VSS stable Hgb 11.2.  Plan for colonoscopy Thursday or Friday afternoon.  I would like to do it Thursday if you think she would be okay for the prep. Will start clear liquid diet.  Electronic Signatures: Manya Silvas (MD)  (Signed on 01-Oct-13 18:40)  Authored  Last Updated: 01-Oct-13 18:40 by Manya Silvas (MD)

## 2015-02-22 NOTE — Consult Note (Signed)
CC: abn CT scan.  Colonoscopy to cecum shows splotches of mild resolving colitis, likely ischemic colits,  and a small polyp with large hemorrhoids.  Will advance diet, local treatment for the hemorrhoids.    Electronic Signatures: Manya Silvas (MD)  (Signed on 03-Oct-13 14:01)  Authored  Last Updated: 03-Oct-13 14:01 by Manya Silvas (MD)

## 2015-02-22 NOTE — H&P (Signed)
PATIENT NAME:  Crystal Delacruz, Crystal Delacruz MR#:  [id number] DATE OF BIRTH:  1924-08-13  DATE OF ADMISSION:  08/03/2012  PRIMARY CARE PHYSICIAN: Dr. Ouida Sills   REFERRING PHYSICIAN: Dr. Ulice Brilliant   CHIEF COMPLAINT: Bloody stool today.   HISTORY OF PRESENT ILLNESS: The patient is an 81 year old Caucasian female with a history of lumbar decompression and fusion, acute renal failure, hypertension, and diabetes who presented to the ED with a bloody stool today. The patient fell by accident yesterday and had injury to her face. She went to the bathroom to have a bowel movement and was found to have a bright bloody stool a lot about 1 p.m. today so the patient came to the ED for further evaluation. The patient's blood pressure was low at 80's, was treated with normal saline bolus and increased to about 100. The patient denies any headache or dizziness but has chronic back pain. The patient said she has been taking high blood pressure medications every day and some pain medications, tramadol and Tylenol. The patient denies any fever or chills. No chest pain, palpitations, orthopnea, or nocturnal dyspnea. The patient denies any dysuria, hematuria. No easy bruising or bleeding.   PAST MEDICAL HISTORY:  1. Hypertension.  2. Diabetes. 3. Renal failure. 4. Lumbar decompression and fusion.   SOCIAL HISTORY: No smoking, drinking, or illicit drugs.   PAST SURGICAL HISTORY: Lumbar decompression and fusion with instrumentation.   FAMILY HISTORY: No hypertension, diabetes, stroke, or heart attack.   ALLERGIES: Allergic to Celebrex, Evista, Lescol, morphine, Norvasc, Opticaine, Singulair, Skelaxin, sulfa drugs,  and Toprol XL.   HOME MEDICATIONS:  1. Ultram 50 mg p.o. twice daily. 2. Tylenol 325 mg p.o. tablets every 4 to 6 hours p.r.n.  3. Atenolol 50 mg p.o. every four hours p.r.n.  4. Torsemide 5 mg p.o. daily.  5. Synthroid 0.05 mg p.o. daily.  6. Nortriptyline 25 mg p.o. b.i.d.  7. Multivitamin 1 tab p.o. daily.   8. Maalox Max p.o. 30 mL every six hours p.r.n.  9. Lisinopril 40 mg p.o. daily.  10. Gabapentin 300 mg 2 tablets in the a.m., 1 tablet at lunchtime, and one tablet at dinnertime.  11. Aspirin 81 mg p.o. daily.  12. Albuterol inhalation 2 puffs every four hours p.r.n.   REVIEW OF SYSTEMS: CONSTITUTIONAL: The patient denies any fever or chills. No headache or dizziness. No weight loss but has generalized weakness. EYES: No double vision or blurred vision. No epistaxis, postnasal drip, slurred speech, or dysphagia. CARDIOVASCULAR: No chest pain, palpitation, orthopnea, or nocturnal dyspnea. No leg edema. PULMONARY: No cough, sputum, shortness of breath, or hemoptysis. GI: Positive for abdominal pain but no nausea, vomiting, or diarrhea. No melena but has bloody stool. GU: No dysuria, hematuria, or incontinence. SKIN: No rash or jaundice but has bruises due to fall. NEUROLOGY: No syncope, loss of consciousness, or seizure. ENDOCRINE: No polyuria, polydipsia, or heat or cold tolerance. HEMATOLOGY: No easy bruising or bleeding but had rectal bleeding today.   PHYSICAL EXAMINATION:   VITAL SIGNS: Temperature 98.5, blood pressure 104/58, pulse 71, respirations 18, oxygen saturation 90% on room air.   GENERAL: The patient is alert, awake, oriented in no acute distress.   HEENT: Pupils round, equal, and reactive to light and accommodation. Moist oral mucosa. Clear oropharynx. There are some bruises on the face but no skin tear or bleeding.   NECK: Supple. No JVD or carotid bruit. No lymphadenopathy. No thyromegaly.   CARDIOVASCULAR: S1, S2 regular rate, rhythm. No murmurs or gallops.  PULMONARY: Bilateral air entry. No wheezing or rales. No use of accessory muscles to breathe.   ABDOMEN: Soft. No distention or tenderness. No organomegaly. Bowel sounds present. The patient has only mild tenderness in the middle part of the abdomen.   SKIN: No rash or jaundice.   EXTREMITIES: No edema, clubbing, or  cyanosis. No calf tenderness.   NEUROLOGY: Alert and oriented x3. No focal deficit. Power 5/5. Sensation intact.   LABORATORY, DIAGNOSTIC, AND RADIOLOGICAL DATA: INR 1.0. WBC 11.1, hemoglobin 13.2, platelets 251, lipase 92, glucose 131, BUN 31, creatinine 1.64. Electrolytes normal. Troponin less than 0.02.   Facial bones limited no acute osseous injury of the facial bones.   EKG shows sinus rhythm with first degree AV block at 71 beats per minute.   IMPRESSION:  1. GI bleeding.  2. Hypotension.  3. Acute renal failure.  4. Diabetes.  5. Asthma.  6. History of hypertension.   PLAN OF TREATMENT:  1. The patient will be kept n.p.o. We will give normal saline IV. Follow-up CBC. GI consult. Also, will give Protonix IVPB q.12 hours.  2. For hypotension, we will hold lisinopril, atenolol, and Lasix. Will give IV normal saline and follow-up BMP.  3. For diabetes, we will start sliding scale.  4. SCD for DVT prophylaxis.   Discussed the patient's situation and plan of treatment with the patient and the patient's husband.    TIME SPENT: About 55 minutes.  ____________________________ Demetrios Loll, MD qc:drc D: 08/03/2012 20:31:46 ET T: 08/04/2012 06:27:15 ET JOB#: [id number]  cc: Demetrios Loll, MD, <Dictator> Ocie Cornfield. Ouida Sills, MD Demetrios Loll MD ELECTRONICALLY SIGNED 08/04/2012 15:11

## 2015-02-22 NOTE — Consult Note (Signed)
CC: rectal bleed, abn CT with right colon mass.  Drinking prep well, starting to work.  Plan colonoscopy tomorrow, C,. dif neg, WBC 6.7, hgb 10.8  VSS afebrile.chest clear. No new complaints, nurse reports a little prolapse.    Electronic Signatures: Manya Silvas (MD)  (Signed on 02-Oct-13 18:42)  Authored  Last Updated: 02-Oct-13 18:42 by Manya Silvas (MD)

## 2015-02-22 NOTE — Consult Note (Signed)
PATIENT NAME:  Crystal Delacruz, Crystal Delacruz MR#:  [id number] DATE OF BIRTH:  [birth year]  DATE OF CONSULTATION:  08/04/2012  REFERRING PHYSICIAN:  Demetrios Loll, MD CONSULTING PHYSICIAN:  Gaylyn Cheers, MD / Janalyn Harder. Jerelene Redden, ANP  REASON FOR CONSULTATION: GI bleed.   HISTORY OF PRESENT ILLNESS: This 81 year old patient of Dr. Ouida Sills has a past history of hypertension, diabetes, renal failure, irritable bowel syndrome, GERD, and diverticular disease and was admitted to the hospital yesterday for acute fall and bright red blood per rectum. The patient states that she thinks she forgot to sit at the side of the bed for a few seconds before she stood up. The patient fell, struck her face and had some bruising, no fractures. Blood pressure was low when she came into the Emergency Room, systolic 64P, treated with a saline bolus and has improved.  She said she passed bloody stools yesterday afternoon at about 1:30, fairly large amount. Then throughout the rest of the day she had a few other minor passages of bloody discharge. She has had some mild hurting in her lower abdomen, cramps. It has been present since she has been here mostly, but she did start to notice it even a few weeks back. Her husband had brought home some peanuts and she was eating peanuts and discovered it had maltodextrin in it and she does not tolerate that additive. She thought the maltodextrin was bothering her stomach so she stopped it a few weeks ago and has not had anymore peanuts since. She denies any fevers or chills, nausea or vomiting and says she feels tired today. Lower abdominal discomfort is more noticeable now. Initial hemoglobin was 13.2 and it has decreased to 11.7 today. No leukocytosis. Her last colonoscopy was in 2004 showing diverticulosis. She does have a history of adenomatous colon polyposis. We have been asked to see her regarding evaluation of bloody stools.   PAST MEDICAL HISTORY:  1. Hypertension.  2. Diabetes mellitus.   3. Hypothyroidism.  4. Glaucoma.  5. Irritable bowel syndrome.  6. Gastroesophageal reflux disease. 7. Allergic rhinitis.  8. Chronic back pain/sciatica.  9. Diverticulosis.  10. Adenomatous colon polyposis.  11. Mild to moderate asthma/chronic obstructive pulmonary disease.  12. History of hepatitis in 1968.   PAST SURGICAL HISTORY:  1. Spinal stenosis fusion surgery in 2003.  2. Breast biopsy x2.  3. Appendectomy.  4. Hysterectomy.  5. Cholecystectomy.  6. Cystocele.  7. Laser surgery.  8. Squamous cell carcinoma resection.  9. Shoulder surgery twice.  ADMISSION MEDICATIONS: (Per chart report) 1. Ultram 50 mg twice daily.  2. Tylenol 325 mg every 4 to 6 hours p.r.n. pain.  3. Tramadol 50 mg every four hours p.r.n.  4. Torsemide 5 mg daily.  5. Synthroid 0.05 mg daily.  6. Nortriptyline 25 mg twice daily.  7. Multivitamin daily.  8. Maalox 30 mL every six hours as needed.  9. Lisinopril 40 mg daily.  10. Gabapentin 300 mg two tablets in the a.m., one tablet at lunchtime, and one tablet at dinnertime.  11. Aspirin 81 mg daily.  12. Albuterol inhalation two puffs every four hours p.r.n.   ALLERGIES: Celebrex, Evista, Lescol, morphine, Norvasc, Opticaine, Singulair, Celexa, sulfa drugs, and Toprol-XL.   REVIEW OF SYSTEMS: Ten systems reviewed. Positives noted per history of present illness. She denies any palpitations, dizziness, or lightheadedness. She says she has been steady on her feet, no falls currently, but she has been to the Emergency Room with head CTs noted for falls in  the past.     PHYSICAL EXAMINATION:   VITAL SIGNS: Temperature 97.5, pulse 66, respirations 18, and blood pressure 96/69.   GENERAL: Elderly Caucasian female, sleeping. Arouses easily.   HEENT: Head is normocephalic. Right facial bruising noted. Conjunctivae pink. Sclerae anicteric. Oral mucosa is dry and intact.   NECK: Supple without lymphadenopathy.   HEART: Heart tones S1 and S2  without murmur or gallop.   LUNGS: Clear to auscultation. Respirations are eupneic, no crackles or wheezing noted. The patient is able to sit up. Posterior exam is normal.   ABDOMEN: Soft, positive bowel sounds, positive tenderness in the mid to lower abdomen bilateral.   RECTAL: Slight bloody mucus, no internal masses palpable.   EXTREMITIES: Lower extremities without edema, cyanosis, or clubbing.   SKIN: Warm and dry without rash.   PSYCH: Alert and oriented once she woke up. Good historian. Good memory. Affect and mood within normal.   LABORATORY AND RADIOLOGIC DATA: Admission blood work with glucose 131, BUN 31, and creatinine 1.64. Normal liver panel. WBC 11 and hemoglobin 13.2. PT 13.7 and INR 1.0.   Repeat laboratory studies today with BUN 25 and creatinine 1.23. That gives an estimated GFR of about 40%. Hemoglobin 11.7. WBC remains normal at 7.9.   Facial x-ray shows no fracture.   IMPRESSION: The patient is with lower gastrointestinal bleed, fall sustaining facial trauma. In addition to the bloody stools, she has had some lower abdominal tenderness/discomfort and she is tender on exam. This is to rule out the possibility of diverticulitis/diverticulosis, colitis infection versus inflammatory process. Last colonoscopy was in 2004, possibility of malignancy is raised.   PLAN: This case was discussed with Dr. Vira Agar in collaboration of care. He recommends empiric antibiotics with Cipro and Flagyl. We will obtain oral contrast CT of the abdomen and pelvis. Further gastroenterology recommendations pending.  ____________________________ Janalyn Harder. Jerelene Redden, ANP kam:slb D: 08/04/2012 15:55:15 ET T: 08/04/2012 16:28:54 ET JOB#: [id number]  cc: Joelene Millin A. Jerelene Redden, ANP, <Dictator> Janalyn Harder. Sherlyn Hay, MSN, ANP-BC Adult Nurse Practitioner ELECTRONICALLY SIGNED 08/14/2012 17:45

## 2015-02-22 NOTE — Discharge Summary (Signed)
PATIENT NAME:  Crystal Delacruz, CRONCE MR#:  [id number] DATE OF BIRTH:  1924-04-26  DATE OF ADMISSION:  08/03/2012 DATE OF DISCHARGE:  08/08/2012  DISCHARGE DIAGNOSES:  1. Lower gastrointestinal bleed with anemia. 2. Ischemic colitis.  3. Hemorrhoids.  4. Hypotension.  5. Accelerated hypertension, off medications.   DISCHARGE MEDICATIONS: Only her usual home medications which are:  1. Tylenol p.r.n.  2. Multivitamin daily.  3. Synthroid 0.05 mg daily.  4. Albuterol inhaled p.r.n. 5. Gabapentin 300 mg, 2 in the morning, 1 at lunch and 1 at dinner.  6. Lisinopril 40 mg daily.  7. Atenolol 50 mg daily.  8. Nortriptyline 25 mg b.i.d.  9. She will not take the aspirin. 10. Tramadol 50 mg q.4 hours.  11. Torsemide 5 mg daily.   HISTORY AND PHYSICAL: Please see detailed history and physical done on admission.   HOSPITAL COURSE: Patient admitted after she had rectal bleeding. It slowed. Her blood pressure stabilized off medications with IV fluids. She had been around systolic of 90. Her hemoglobin stabilized and ranged around 11 to 12 after being admitted over 13. She did well otherwise and GI was consulted. Colonoscopy was done yesterday that showed ischemic colitis after there is some thickening of the bowel wall in that area. She also had some internal hemorrhoids. She needs to use cream on that. She thinks her hemorrhoids were exacerbated by colonoscopy prep.     TIME SPENT: Took approximately 36 minutes to do discharge tasks.   ____________________________ Ocie Cornfield. Ouida Sills, MD mwa:cms D: 08/08/2012 08:00:43 ET T: 08/08/2012 09:18:24 ET JOB#: [id number]  cc: Ocie Cornfield. Ouida Sills, MD, <Dictator> Kirk Ruths MD ELECTRONICALLY SIGNED 08/11/2012 8:23

## 2015-02-22 NOTE — Consult Note (Signed)
Brief Consult Note: Comments: Pt declines Cipro-thinks she had a reaction to it in the past-but doesn't recall the details. We will hold on the abx until the CT results are reviewed by Dr. Vira Agar.  Electronic Signatures: Gershon Mussel (NP)  (Signed 30-Sep-13 17:29)  Authored: Brief Consult Note   Last Updated: 30-Sep-13 17:29 by Gershon Mussel (NP)

## 2015-02-22 NOTE — Consult Note (Signed)
Brief Consult Note: Diagnosis: Lower GI bleed, fall with facial trauma, lower abdominal pain/tenderness to consider diverticulitis/colitis-infectectious/inflammatory.   Patient was seen by consultant.   Consult note dictated.   Comments: Moderately tender bilateral lower abdomen. DRE by me with scant red bloody mucous-no palp mass. PLAN: Add Flagyl and Cipro. Obtain oral contrast abd/pelvis CT today. Case d/w Dr. Vira Agar in collaboration of care. Further orders pending.  Electronic Signatures: Gershon Mussel (NP)  (Signed 30-Sep-13 14:26)  Authored: Brief Consult Note   Last Updated: 30-Sep-13 14:26 by Gershon Mussel (NP)

## 2015-02-22 NOTE — Consult Note (Signed)
ZY:YQMGNO bleeding.  Pt had only a small bowel movement of blood today.  Her last colonoscopy was several years ago with me.  She is drinking prep for a CT at this time.  No signif pain or tenderness at this time.  Possible diverticular bleed, doubt ischemia given her absense of pain at this time.  Discussed possible need for colonoscopy during admission since she had a fall at home.  Await CT and will follow with you.  Electronic Signatures: Manya Silvas (MD)  (Signed on 30-Sep-13 17:54)  Authored  Last Updated: 30-Sep-13 17:54 by Manya Silvas (MD)

## 2015-02-25 NOTE — H&P (Signed)
PATIENT NAME:  Crystal Delacruz, Crystal Delacruz MR#:  [id number] DATE OF BIRTH:  Dec 17, 1923  DATE OF ADMISSION:  02/27/2013  PRIMARY CARE PHYSICIAN: Ocie Cornfield. Ouida Sills, MD  CHIEF COMPLAINT: Weakness.   HISTORY OF PRESENT ILLNESS: This is an 81 year old female who could not get up today. She has been doing lots of sleeping, very tired and fatigued. She had diarrhea at least 12 times last night and dry heaving 3 times, had abdominal pain 10 out of 10 yesterday. She was constipated before this happened and then she had all the diarrhea and then took an antidiarrheal pill. Usually she walks with a walker and is able to get to the bathroom, but can hardly get out of bed in the ER. She has been in the ER for over 8 hours. Prior to admission, she had a CT scan of the abdomen and pelvis that showed bowel thickening consistent with colitis and right lower lobe airspace disease, which could represent a developing pneumonia. She also had periods of hypotension and she was found to be in acute renal failure. She had an initial EKG that showed atrial fibrillation. Hospitalist services were contacted for further evaluation.   PAST MEDICAL HISTORY: Includes glaucoma, hypertension, neuropathy, hypothyroidism, lower extremity edema.   PAST SURGICAL HISTORY: Four back surgeries, cataract surgery, eye surgery, hysterectomy, cholecystectomy, right shoulder surgery, right knee surgery.   ALLERGIES: CELEBREX, CIPRO, AVISTA, LESCOL, MORPHINE, NORVASC, OPTICAINE, SINGULAIR, SKELAXIN AND SULFA.   MEDICATIONS: Include albuterol CFC 2 puffs 4 times a day as needed for wheezing, aspirin 81 mg daily, atenolol 50 mg daily, gabapentin 300 mg 2 capsules 3 times a day, lisinopril 40 mg daily, nortriptyline 25 mg twice a day, Synthroid 50 mcg daily, torsemide 5 mg daily, tramadol p.r.n. pain.   SOCIAL HISTORY: No smoking. No alcohol. No drug use. Is a prior pharmacist.   FAMILY HISTORY: Father died of heart failure. Mother died of cancer of the  uterus.   REVIEW OF SYSTEMS:    CONSTITUTIONAL: Positive for cold feeling. No fever or chills. Positive for weakness. No weight loss. No weight gain.  EYES: She does wear glasses and has glaucoma.  EARS, NOSE, MOUTH AND THROAT: Decreased hearing. Positive for sore throat. Positive for dry mouth.  CARDIOVASCULAR: No chest pain. No palpitations.  RESPIRATORY: Positive for shortness of breath. Positive for cough. No sputum. No hemoptysis.  GASTROINTESTINAL: Positive for nausea with dry heaving, abdominal pain yesterday. Positive for diarrhea. She does see blood with hemorrhoids.  GENITOURINARY: No burning on urination. No hematuria.  MUSCULOSKELETAL: Positive for joint pain.  INTEGUMENTARY: No rashes or eruptions.  NEUROLOGIC: No fainting or blackouts.  PSYCHIATRIC: Positive for anxiety.  ENDOCRINE: No thyroid problems.  HEMATOLOGIC AND LYMPHATIC: No anemia.   PHYSICAL EXAMINATION:  VITAL SIGNS: Temperature 97.8, pulse 70, respirations 20, blood pressure 111/49, pulse oximetry 97%. On presentation to the ER, blood pressure was 78/39.  GENERAL: No respiratory distress.  EYES: Conjunctivae and lids normal. Pupils equal, round and reactive to light. Extraocular muscles intact. No nystagmus.  EARS, NOSE, MOUTH AND THROAT: Tympanic membranes: No erythema. Nasal mucosa: No erythema. Throat: No erythema, no exudate seen. Lips and gums: No lesions.  NECK: No JVD. No bruits. No lymphadenopathy. No thyromegaly. No thyroid nodules palpated.  LUNGS: Clear to auscultation. No use of accessory muscles to breathe. No rhonchi, rales or wheeze heard.  CARDIOVASCULAR: S1, S2, regular. No gallops, rubs or murmurs heard. Carotid upstroke 2+ bilaterally. No bruits.  EXTREMITIES: Dorsalis pedis pulses 2+ bilaterally. No edema  of the lower extremities.  ABDOMEN: Soft, nontender. No organomegaly/splenomegaly. Normoactive bowel sounds. No masses felt.  LYMPHATIC: No lymph nodes in the neck.  MUSCULOSKELETAL: No  clubbing. No cyanosis. Positive edema of the lower extremities.  SKIN: No ulcers or lesions seen.  NEUROLOGIC: Cranial nerves II through XII grossly intact. Deep tendon reflexes 1+ bilateral lower extremities.  PSYCHIATRIC: The patient is oriented to person, place and time.   LABORATORY AND RADIOLOGICAL DATA: EKG showed A. fib at 67 beats per minute. Troponin negative. White blood cell count 11.5, hemoglobin and hematocrit 11.6 and 35.2, platelet count 174. Glucose 112, BUN 44, creatinine 2.33, sodium 136, potassium 4.5, chloride 101, CO2 of 30, calcium 9.1. Liver function tests normal range. Albumin low at 3.2. GFR 18. Urinalysis negative. Chest x-ray: No acute disease in the chest. Repeat  hemoglobin 10.4. CT scan of the abdomen and pelvis showed bowel wall thickening consistent with colitis; right lower lobe airspace disease, may reflect atelectasis versus developing pneumonia.   ASSESSMENT AND PLAN:  1.  Acute renal failure with hypotension: Will give intravenous fluid hydration. Likely this is dehydration from the nausea, not eating and diarrhea. Will hold blood pressure medications including lisinopril, atenolol and torsemide. Continue to monitor creatinine on a daily basis. Blood pressure is good enough where we can monitor her on the floor at this point. The patient has already received 5 liters of intravenous fluids by the Emergency Room physician.  2.  Colitis and possible pneumonia:  Intravenous  Levaquin and Flagyl ordered by Emergency Room physician. Will continue those in the hospital. Send off stool studies. Since the patient did taken an antidiarrheal pill, we may not get stool studies at this time.  3.  Brief episode of atrial fibrillation, likely with dehydration: This resolved with intravenous fluids. Will check a TSH and echocardiogram. The patient's heart rate is good at this point.  4.  Hypothyroidism: Continue Synthroid. Will check a TSH.  5.  Neuropathy: On gabapentin for this.   6.  Weakness: Will get a physical therapy evaluation.   TIME SPENT ON ADMISSION: 55 minutes.   CODE STATUS: The patient is a full code.   ____________________________ Tana Conch. Leslye Peer, MD rjw:jm D: 02/27/2013 19:32:08 ET T: 02/27/2013 20:06:21 ET JOB#: [id number]  cc: Tana Conch. Leslye Peer, MD, <Dictator> Ocie Cornfield. Ouida Sills, MD Marisue Brooklyn MD ELECTRONICALLY SIGNED 03/07/2013 12:59

## 2015-02-25 NOTE — Discharge Summary (Signed)
PATIENT NAME:  Crystal Delacruz, Crystal Delacruz MR#:  [id number] DATE OF BIRTH:  10-21-1924  DATE OF ADMISSION:  02/27/2013 DATE OF DISCHARGE:  03/02/2013  DISCHARGE DIAGNOSES: 1.  Colitis.  2.  Acute renal failure secondary to dehydration, now resolved.  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  HISTORY AND PHYSICAL: Please see detailed history and physical done on admission.  HOSPITAL COURSE: Admitted with diarrhea, abdominal pain. Found to have evidence for colitis by CT scan. She was fluid resuscitated.  In fact had minimal edema after a day or 2 of fluid and that was stopped. Fever did improve. Her C. diff toxin was negative in blood. Did have positive  Hemoccults, 0 white cells. She had a stool culture that was negative, blood cultures were negative. Her hypotension present on admission was also improved with fluid resuscitation. She was ambulating well, eating, diarrhea was controlled with p.r.n. Imodium. She will be discharged home with Flagyl and Levaquin for 3 more days and to be followed up in the office in 5 to 10 days. ____________________________ Ocie Cornfield. Ouida Sills, MD mwa:sb D: 03/02/2013 08:02:15 ET T: 03/02/2013 08:07:23 ET JOB#: [id number]  cc: Ocie Cornfield. Ouida Sills, MD, <Dictator> Kirk Ruths MD ELECTRONICALLY SIGNED 03/03/2013 20:17

## 2015-02-26 NOTE — Consult Note (Signed)
Pt with ischemic  colitis, doing better, less pain and tenderness, off Levophed.  No bleeding.  Chest clear ant fields, VSS. Pt for arteriographic study today with Dr. Lucky Cowboy.  Agree with move to floor.  Start clear liquid diet per Dr. Lucky Cowboy when he feels ready.  Electronic Signatures: Manya Silvas (MD)  (Signed on 07-Oct-15 15:32)  Authored  Last Updated: 07-Oct-15 15:32 by Manya Silvas (MD)

## 2015-02-26 NOTE — Consult Note (Signed)
See full dictated consult note to follow for full details.short, has severe left sided colitis of unclear etiology.  Is in the location of ischemic colitis.has abdominal pain as primary complaint, although very sore all over after her fall.requiring pressors.  Has gotten several liters of fluid.mildly distended with tenderness present.scan reviewed.  This is uninfused so unable to give much information about her vascular flow but there is significant aortic and branch vessel calcification. function is poor, so no IV contrast was given  colitis, possibly ischemicischemic colitis is not due to large vessel vascular disease but a portion are.  Would consider an angiogram for further evaluation if renal function improves over next couple of days.colitis is severe, and may ultimately need resection of part of the colon.  Would consider a General Surgery consult as well. follow  Electronic Signatures: Algernon Huxley (MD)  (Signed on 05-Oct-15 16:48)  Authored  Last Updated: 05-Oct-15 16:48 by Algernon Huxley (MD)

## 2015-02-26 NOTE — Consult Note (Signed)
Pt sent back to CCU for at fib rate about 150.  Now about 124, some abd pain but less, less tenderness to palpation, no bleeding but has abd pain with taking nourishment.  family asked about when she can eat solid food and I said that when her abd did not hurt taking more resttricted food that we could advance diet.  Vascular note noted.  Electronic Signatures: Manya Silvas (MD)  (Signed on 08-Oct-15 18:40)  Authored  Last Updated: 08-Oct-15 18:40 by Manya Silvas (MD)

## 2015-02-26 NOTE — Consult Note (Signed)
Pt with ischemic colitis with abnormal CT in watershed area of descending/transverse colon.  Severe hypotension resistant to fluids, better after levophed which she is off of now.  Has tender abdomen in areas seen on CT.  Will get vascular to do arteriography to see if stent needed.  may be large vessel disease or small vessel disease with altered regulation due to acute diarrhea causing dehydration followed by shunting blood flow away from colon causing the acute ischemia.  I will follow with you.  No colonoscopy for now.  Encouraging she has not had bleeding with this.  Electronic Signatures: Manya Silvas (MD)  (Signed on 06-Oct-15 17:03)  Authored  Last Updated: 06-Oct-15 17:03 by Manya Silvas (MD)

## 2015-02-26 NOTE — H&P (Signed)
PATIENT NAME:  Crystal Delacruz, Crystal Delacruz MR#:  [id number] DATE OF BIRTH:  01/07/1924  DATE OF ADMISSION:  08/09/2014  PRIMARY CARE PHYSICIAN:  Ocie Cornfield. Ouida Sills, MD   REFERRING PHYSICIAN:     CHIEF COMPLAINT:  Confusion, severe diarrhea.   HISTORY OF PRESENT ILLNESS:  Crystal Delacruz is an 81 year old pleasant female with history of diabetes mellitus, diet controlled, and hypertension, who has poor functional status at baseline and has been constipated since Thursday. Yesterday, the patient took Dulcolax followed by having multiple episodes of diarrhea. The patient's family continued to give her fluids; however, by evening, the patient started to be disoriented. Concerning this, the patient is brought to the Emergency Department.   WORKUP IN THE EMERGENCY DEPARTMENT:  The patient is found to be hypotensive with systolic blood pressure in the 70s. The patient received 3 liters of fluid bolus, and despite that, the patient continued to be hypotensive. Plan is to get a central line by general surgery and start the patient on Levophed. The patient fell down about 1 week back; however, workup is negative for any fractures. The patient is also found to be in acute renal failure. No obvious source of any infection is noted. The patient's mucous membranes are extremely dry. The patient was noted to have a fever of 101 in the Emergency Department. The patient denies having any pain; however, on palpation, the patient is quite tender along the colon. Denies having any blood in the stool. Hemoglobin has been stable.   PAST MEDICAL HISTORY: 1.  Glaucoma.  2.  Hypertension.  3.  Neuropathy.  4.  Hypothyroidism.  5.  Lower extremity edema.  6.  Hepatitis.  7.  Asthma.  PAST SURGICAL HISTORY:   1.  Right knee arthroplasty.  2.  Rotator cuff repair to right arm.  3.  Glaucoma .   4.  Back surgery x 4.  5.  Breast cyst removed.  6.  Hysterectomy.  7.  Bladder surgery.  8.  Cholecystectomy.   ALLERGIES:  SKELAXIN,  CIPRO, VIOXX, SUGAR SUBSTITUTE, EVISTA, MORPHINE, OPTICAINE, TAPE, SULFA, NORVASC, CELEBREX, ZOCOR, SINGULAIR, ZETIA, LESCOL, TOPROL-XL.   HOME MEDICATIONS: 1.  Temodar 50 mg every 4 to 6 hours as needed.  2.  Torsemide 5 mg once a day.  3.  Synthroid 50 mcg once daily.  4.  Nortriptyline 25 mg 2 times a day.  5.  Lisinopril 40 mg once a day. 6.  Gabapentin 300 mg 2 tablets 3 times a day.  7.  Atenolol 50 mg once a day.  8.  Aspirin 81 mg once a day. 9.  Albuterol 2 puffs 4 times a day.  10.  Advair Diskus 2 times a day.   SOCIAL HISTORY:  No history of smoking, drinking alcohol, or using illicit drugs. Was a previous pharmacist. Married, lives with her husband. Husband is the primary caregiver.   FAMILY HISTORY:  Father died of heart failure. Mother died of uterine cancer.   REVIEW OF SYSTEMS:  Could not be obtained from the patient, as the patient is somewhat confused today.   PHYSICAL EXAMINATION:  GENERAL:  This is a well-built, well-nourished, age-appropriate female lying down in the bed, not in distress.  VITAL SIGNS:  Temperature 98.1, pulse 75, blood pressure 87/46, respiratory rate 18, oxygen saturation is 98% on room air.  HEENT:  Head is normocephalic, atraumatic. No scleral icterus. Conjunctivae are normal. Pupils are equal, round, and react to light. Mucous membranes are dry. I could not examine the  oropharynx.  NECK:  Supple. No lymphadenopathy. No JVD. No carotid bruit. No thyromegaly.  CHEST:  Has no focal tenderness. Decreased breath sounds in the lower lobes.  HEART:  S1, S2 regular. No murmurs are heard.  ABDOMEN:  Bowel sounds present. Soft. Has tenderness diffusely.  EXTREMITIES:  No pedal edema. Pulses are +2.  NEUROLOGIC:  The patient is alert but confused and not oriented to time. No apparent cranial nerve abnormalities. Cannot examine the motor and sensory.   LABORATORY AND RADIOLOGIC DATA:  CBC: WBC of 10.4, hemoglobin 13, platelet count of 189.   CMP:   Glucose 228, BUN 39, creatinine 2.35, the rest of all the values are within normal limits.   Urinalysis is negative for nitrites and leukocyte esterase.   CT of the head, maxillofacial and CT scan of cervical spine:  No obvious fractures are noted.   ASSESSMENT AND PLAN:  Crystal Delacruz is an 81 year old female who comes with septic shock and acute renal failure.   1.  Septic shock of unknown source, most likely colitis. We will obtain CT of the abdomen and pelvis without contrast. Keep the patient on vancomycin and Zosyn until we have the blood culture data available. Also, other possibility of patient being dehydrated and taking multiple blood pressure medications. Admit the patient to the intensive care unit. Will start the patient on Levophed.  2.  Acute renal failure secondary to and prerenal. Continue with IV fluids and follow up. Hold all blood pressure medications.  3.  History of hypertension. Hold all blood pressure medications.  4.  Hypothyroidism. Check TSH level.  5.  Keep the patient on deep vein thrombosis prophylaxis with heparin.   TIME SPENT:  60 minutes of critical care time.    ____________________________ Monica Becton, MD pv:nb D: 08/09/2014 04:34:16 ET T: 08/09/2014 05:57:11 ET JOB#: [id number]  cc: Monica Becton, MD, <Dictator> Ocie Cornfield. Ouida Sills, MD  Grier Mitts Kaislyn Gulas MD ELECTRONICALLY SIGNED 08/18/2014 22:23

## 2015-02-26 NOTE — Consult Note (Signed)
PATIENT NAME:  Crystal Delacruz, Crystal Delacruz MR#:  [id number] DATE OF BIRTH:  03/25/1924  DATE OF CONSULTATION:  08/09/2014   CONSULTING PHYSICIAN:  Algernon Huxley, MD   REASON FOR CONSULTATION: Ischemic colitis.   HISTORY OF PRESENT ILLNESS: This is an 81 year old female who is admitted with severe left-sided colitis of unclear etiology. She has had abdominal pain as her primary complaint over several days to maybe even more than a week.  She is very sore all over now after she has fallen. She has had low blood pressure and is actually on pressors in the unit requiring fluid boluses currently.  She was also in acute renal failure on admission so her original CT scan was without contrast.  Given the location of the colitis which appeared quite severe on her CT scan being primarily on the left side and up to the splenic flexure, ischemic colitis was considered in the differential.  She has no previous history of abdominal pain, nausea, vomiting, or diarrhea prior to this episode over the past week or 2. 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  She is somnolent, but does answer questions appropriately and seems very alert and oriented.  Her abdominal pain persists; it is about an 8 out of 10 currently, has been as bad as a 10 out of 10, but is primarily on the left side but is somewhat generalized.  PAST MEDICAL HISTORY:   1.   Glaucoma.  2.  Hypertension.  3.  Neuropathy.  4.  Hypothyroidism.  5.  Hepatitis.  6.  Asthma.   PAST SURGICAL HISTORY:   1.  Scope of the right knee.  2.  Rotator cuff, right arm.  3.  Glaucoma surgery.  4.  Back surgery x 4.  5.  Breast cyst surgery.  6.  Cholecystectomy.  7.  Bladder surgery.  8.  Hysterectomy.   ALLERGIES: EXTENSIVE AND INCLUDE SKELAXIN, CIPRO, VIOXX, SUGAR SUBSTITUTE, AVISTA, MORPHINE, OPTICAINE, TAPE, SULFA, NORVASC, CELEBREX, ZOCOR, SINGULAIR, ZETIA, LESCOL AND  TOPROL.  HOME MEDICATIONS: Include: 1.  Temodar 50 mg as needed.  2.  Torsemide 5 mg daily.  3.  Synthroid 50 mcg daily.  4.  Nortriptyline 25 mg b.i.d.  5.  Lisinopril 40 mg daily.  6.  Gabapentin 600 mg t.i.d.  7.  Atenolol 50 mg daily.  8.  Aspirin 81 mg daily.  9.  Albuterol 2 puffs 4 times daily.  10. Advair Diskus twice daily.   SOCIAL HISTORY:  No alcohol, tobacco or drug use;. 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   FAMILY HISTORY:  Father died of heart failure. Mother died of uterine cancer.   REVIEW OF SYSTEMS:   GENERAL:  Positive for fever and lethargy. No unintentional weight loss or gain.  EYES: No blurry or double vision.  EARS: No tinnitus or ear pain. FACE:  Positive for bruising and pain from her fall. CARDIAC: No chest pain or palpitations.  RESPIRATORY: No shortness of breath or cough.  GASTROINTESTINAL: As per history of present illness.  GENITOURINARY: Positive for acute renal failure.  ENDOCRINE: No heat or cold intolerance.  PSYCHIATRIC: No anxiety or depression.  NEUROLOGIC:  There were no TIA, stroke or seizure, some confusion with this episode due to her hypotension. HEMATOLOGY:  No anemia or easy bruising.  SKIN: Positive for bruising from a significant fall.  PHYSICAL EXAMINATION:  GENERAL:  She is awake, alert, in discomfort and in the Critical Care Unit.  VITAL SIGNS: Temperature of 99.2, pulse of 73, blood pressure is 105/59. She is on pressors, saturations are 97% on 3 liters nasal cannula. Respirations are 19 per minute.  HEENT: Bruising is present over the face and eye areas from her fall. She is normocephalic.  EYES: Sclerae nonicteric. Conjunctivae are clear. Ears, normal external appearance, hearing is intact.  HEART: Regular rate and rhythm without murmur, rubs, or gallops.  LUNGS: Clear bilaterally bur somewhat diminished.  ABDOMEN: Soft.  She is tender to palpation in the epigastrium and the left  upper abdomen and left lower quadrant.  She has some guarding. No obvious rebound.  EXTREMITIES: Have some lower extremity edema. Good capillary refill.  NEUROLOGIC:  Normal strength and tone in all 4 extremities. SKIN:  Bruising is present as above  PSYCHIATRIC: Normal affect and mood.   LABORATORY EVALUATIONS: Reveal a sodium of 131, potassium 5.0, CO2 of 26, BUN 39, creatinine is 2.35, glucose 228, troponin negative.  White blood cell count is 10.4, hemoglobin is 13.1, platelet count 189,000.  CT scan of the abdomen and pelvis which I have independently reviewed shows severe left-sided colitis. This is an un-infused scan so ir is difficult to assess her perfusion entirely, but there is only some mild atherosclerotic disease in the celiac artery.  The SMA appears patent.  The IMA appears small, but this is difficult to discern on an un-infused scan   ASSESSMENT AND PLAN:  This is an 81 year old white female with severe colitis that is possibly ischemic.  Given the location, ischemic colitis certainly in the differential.  Most ischemic colitis is from small vessel disease but there is a significant minority that can be from large vessel disease.  I think she should have a GI and general surgery consult.  If her colitis does not improve with conservative therapy, surgical resection will be necessary, and if they or GI would like to have an angiogram for further evaluation of her blood flow if her renal functions improves over the next day or 2, this is certainly a reasonable option and I will be happy to perform,   I will follow along.   This a level 4 consultation.     ____________________________ Algernon Huxley, MD jsd:DT D: 08/19/2014 10:13:55 ET T: 08/19/2014 12:31:07 ET JOB#: [id number]  cc: Algernon Huxley, MD, <Dictator> Algernon Huxley MD ELECTRONICALLY SIGNED 09/02/2014 15:24

## 2015-02-26 NOTE — Op Note (Signed)
PATIENT NAME:  Crystal Delacruz, Crystal Delacruz MR#:  [id number] DATE OF BIRTH:  25-Jun-1924  DATE OF PROCEDURE:  08/09/2014  PREOPERATIVE DIAGNOSIS: Shock.   POSTOPERATIVE DIAGNOSIS: Shock.  PROCEDURE PERFORMED:   1.  Attempted left subclavian vein central line placement.  2.  Right femoral vein central line placement.   SURGEON: Richard E. Phoebe Perch, MD   ANESTHESIA: Local anesthetic.   INDICATIONS: This is a patient with presentation to the Emergency Room in shock with acute renal failure. Dr. Dahlia Client asked me to place a central line.   Preoperatively, we discussed the rationale and risks in detail with her husband, given the history that she had had a central line placed before for the same reasons, but could not remember where or which side it was placed on.   FINDINGS:  1.  Attempted left subclavian vein central line placement. The vein could be accessed easily with good return blood flow, but the Seldinger wire could not be threaded on multiple attempts.  2.  The right femoral vein accessed without difficulty.  3.  A postoperative chest film was ordered and pending.   DESCRIPTION OF PROCEDURE: The patient was identified in the Emergency Room. Cap, gown and gloves were utilized. She was prepped and draped in a sterile fashion. Lidocaine was infiltrated in the skin and subcutaneous tissues around the left subclavian area/anterior chest wall, and a large-bore needle was placed into the left subclavian vein. Good blood flow returned, but the Seldinger wire could not be threaded. The needle was repositioned multiple times; again, good blood flow was returned, but no threading of the vein via the Seldinger wire could be performed. (This patient was in steep Trendelenburg position and could still not be threaded.)  After multiple attempts, this site was abandoned.   The patient was reprepped and redraped, again with cap, gown, and gloves being utilized. Local anesthetic was utilized and then, on the first  attempt, a large-bore needle was placed in the right femoral vein. The Seldinger wire was placed followed by the introducer dilator, and the previously flushed triple-lumen catheter was placed and sutured to the skin of the anterior thigh. A sterile dressing was placed. All ports were aspirated and flushed for function.   The patient tolerated the procedure well. The only complication was the inability to place the left subclavian vein catheter, and a postoperative chest film is currently pending.    ____________________________ Jerrol Banana. Burt Knack, MD rec:MT D: 08/09/2014 09:98:33 ET T: 08/09/2014 05:54:28 ET JOB#: [id number]  cc: Jerrol Banana. Burt Knack, MD, <Dictator> Florene Glen MD ELECTRONICALLY SIGNED 08/11/2014 19:25

## 2015-02-26 NOTE — Discharge Summary (Signed)
PATIENT NAME:  Crystal Delacruz, Crystal Delacruz MR#:  [id number] DATE OF BIRTH:  September 25, 1924  DATE OF ADMISSION:  08/09/2014 DATE OF DISCHARGE:  08/18/2014  DISCHARGE DIAGNOSES:  1.  Ischemic colitis.  2.  Hypovolemic shock.  3.  Atrial fibrillation.  4.  Encephalopathy.  5.  Possible baseline dementia.  6.  Acute renal failure, now back to baseline.  7.  Hypothyroid.  8.  Asthma with acute exacerbation and hypoxic respiratory failure, now off of oxygen.   DISCHARGE MEDICATIONS: Per [facility] med reconciliation and discharge form.  HISTORY AND PHYSICAL: Please see the detailed history and physical done on admission.   HOSPITAL COURSE: The patient was admitted 08/09/2014, hypotensive, in a shock state.  She was resuscitated with fluids and pressors and improved. She had marked diarrhea that was copious and colitis was seen on her CT scan. GI and vascular surgery as well as general surgery were consulted. Angiogram was eventually done which showed no large vessel lesions. This was felt to be small vessel disease. Her creatinine was 2.35 on admission with a GFR of 21, which improved to greater than 60 GFR by the time of discharge. She also had some trauma with a fall related to her acute illness.  CT scans of her head and face did not show further trauma or bony lesions. CT of cervical spine did not show lesions either. Cultures were negative. She had a negative x-ray of her hip as she had some hip pain post fall.   The diarrhea did eventually clear.  She was taking p.o., albeit small amounts. Her thyroid hormone was replaced IV and then was replaced p.o. Notably, her albumin level was decreased to 2.1 given her degree of malnutrition, protein/calorie, with inability to eat, as well, given her acute illness. It was felt she needed a skilled facility to help her improve. She did have some hypokalemia, given her diarrhea. She was given multiple doses of IV potassium. Her potassium is now 3.4, which is near normal. She will get  2 more days of p.o. potassium and then we will recheck this. Discussion with the husband today shows some degree of confusion over a year or so.  Will have a B12 and TSH level checked in 2 days at the facility with her metabolic panel.   TIME SPENT: It took approximately 35 minutes to do all discharge tasks today.   ____________________________ Ocie Cornfield. Ouida Sills, MD mwa:lt D: 08/18/2014 07:54:25 ET T: 08/18/2014 08:28:42 ET JOB#: [id number]  cc: Ocie Cornfield. Ouida Sills, MD, <Dictator> Kirk Ruths MD ELECTRONICALLY SIGNED 08/19/2014 9:11

## 2015-02-26 NOTE — Discharge Summary (Signed)
PATIENT NAME:  Crystal Delacruz, Crystal Delacruz MR#:  [id number] DATE OF BIRTH:  22-Jan-1924  DATE OF ADMISSION:  09/10/2014 DATE OF DISCHARGE:  09/15/2014  DISCHARGE DIAGNOSES: 1. Diffuse rash secondary to stasis dermatitis.  2. Massive edema from being off her diuretics.  3. Recent ischemic colitis having been in a skilled nursing facility recently.  4. Atrial fibrillation with the above incident, now seemingly normal sinus rhythm on aspirin alone secondary to bleeding with ischemic colitis.  5. Encephalopathy, possible early dementia.   DISCHARGE MEDICATIONS: Per [facility] medication reconciliation system. Please see for details.   Basically will be off of her diltiazem, as she doesn't seem to need that for rate control, and there was concern over a possible allergy to that. She will be on more diuretic in the form of torsemide 20 mg a day and potassium chloride 20 mEq daily. Her potassium was slightly low with that and will need to be rechecked next Tuesday as noted in the orders.   HISTORY AND PHYSICAL: Please see detailed history and physical done on admission.   HOSPITAL COURSE: The patient was admitted after having massive edema and erythema in her legs, more so lower leg area, legs, abdomen, back, as well. That improved with diuresis, although she has some erythema remaining in her lower legs. Laboratories were relatively stable except for some hyperglycemia with her type 2 diabetes and anemia, which is stable since her last admission.   She wished to go to a different skilled facility, so that is being arranged. She is being transferred to [facility] today per previous plans. She will be seen there in the next few days, as well.    TIME SPENT: It took approximately 35 minutes to do all discharge tasks today   ____________________________ Ocie Cornfield. Ouida Sills, MD mwa:JT D: 09/15/2014 07:57:16 ET T: 09/15/2014 08:35:47 ET JOB#: [id number]  cc: Ocie Cornfield. Ouida Sills, MD, <Dictator>  Kirk Ruths  MD ELECTRONICALLY SIGNED 09/17/2014 8:11

## 2015-02-26 NOTE — Consult Note (Signed)
Chief Complaint:  Subjective/Chief Complaint patient in room with daughter-in-law.  no apparent distress, at times she will say she has abdominal discomfort, then not. some mild confusion. denies nausea or vomiting, patient ate some cream potatos earlier.  loose brown stool. no hematochezia.   VITAL SIGNS/ANCILLARY NOTES: **Vital Signs.:   10-Oct-15 08:05  Vital Signs Type Q 4hr  Temperature Temperature (F) 97.8  Celsius 36.5  Temperature Source oral  Pulse Pulse 85  Respirations Respirations 20  Systolic BP Systolic BP 130  Diastolic BP (mmHg) Diastolic BP (mmHg) 76  Mean BP 104  Pulse Ox % Pulse Ox % 96  Pulse Ox Activity Level  At rest  Oxygen Delivery 2L  *Intake and Output.:   10-Oct-15 00:09  Stool  0    02:11  Stool  0    08:05  Stool  medium loose dark brown    12:02  Stool  medium loose dark brown   Brief Assessment:  Cardiac Irregular   Respiratory clear BS   Gastrointestinal details normal Soft  Nondistended  No masses palpable  Bowel sounds normal  No rebound tenderness  mild tenderness to light palpation over periumbilical hernia and periumbilical.   Lab Results: Routine Chem:  10-Oct-15 03:07   Glucose, Serum  139  BUN  4  Creatinine (comp) 0.71  Sodium, Serum 143  Potassium, Serum  3.3  Chloride, Serum 106  CO2, Serum 29  Calcium (Total), Serum  7.1  Anion Gap 8  Osmolality (calc) 284  eGFR (African American) >60  eGFR (Non-African American) >60 (eGFR values <22m/min/1.73 m2 may be an indication of chronic kidney disease (CKD). Calculated eGFR, using the MRDR Study equation, is useful in  patients with stable renal function. The eGFR calculation will not be reliable in acutely ill patients when serum creatinine is changing rapidly. It is not useful in patients on dialysis. The eGFR calculation may not be applicable to patients at the low and high extremes of body sizes, pregnant women, and vetetarians.)  Result Comment LABS - This specimen  was collected through an   - indwelling catheter or arterial line.  - A minimum of 551m of blood was wasted prior    - to collecting the sample.  Interpret  - results with caution.  Result(s) reported on 14 Aug 2014 at 03:52AM.   Assessment/Plan:  Assessment/Plan:  Assessment 1) colitis on ct, probable ischemic colitis per evaluation.  abdominal pain slowly improving. less pain today per family. since arteriogram was negative for large vessel disease, assumption is small vessel disease.  Rheologic agent such as pentoxyphylline may be of benefit, I will discuss with vascular surgery when they are available.   Plan as above.  slow advancement of diet.   Electronic Signatures: SkLoistine SimasMD)  (Signed 10-Oct-15 13:42)  Authored: Chief Complaint, VITAL SIGNS/ANCILLARY NOTES, Brief Assessment, Lab Results, Assessment/Plan   Last Updated: 10-Oct-15 13:42 by SkLoistine SimasMD)

## 2015-02-26 NOTE — Consult Note (Signed)
PATIENT NAME:  Crystal Delacruz, Crystal Delacruz MR#:  [id number] DATE OF BIRTH:  07-12-1924  DATE OF CONSULTATION:  08/10/2014  CONSULTING PHYSICIAN:  Manya Silvas, MD  HISTORY OF PRESENT ILLNESS: The patient is an 81 year old white female who was having constipation and took Dulcolax and then had multiple episodes of diarrhea. She developed disorientation, abdominal pain and was brought to the ER and found to be hypotensive with systolic blood pressure in the 70s. She received 3 units of IV fluids, continued to be hypotensive, so she was admitted to the hospital. A central line was inserted and she was started on Levophed drip. I was asked to see her.   The patient had a CAT scan which showed extensive findings in the descending colon, transverse colon, typical of areas of involvement with ischemic colitis. The CAT scan of the abdomen and pelvis was done without contrast. The liver was somewhat prominent at 20 cm. There was extensive thickening of the wall of the distal transverse colon and the entire descending colon and the proximal sigmoid colon. There was no intramural air. Surrounding mesentery did not look or appear thickened. There was some fluid surrounding the distal descending colon and an area of thickening. There is atherosclerotic change in the aorta without aneurysm. No significant calcifications of the origin of the celiac and superior mesenteric arteries. The inferior mesenteric artery appeared occluded   Since admission and being placed on Levophed, her pressure has responded to the fluids and the Levophed and she has been tapered off of it. She has been off of it for about 3 hours now.   PAST MEDICAL HISTORY:  1. Glaucoma. 2. Hypertension. 3. Neuropathy. 4. Hypothyroidism.  5. Asthma.  6. Hepatitis.   PAST SURGICAL HISTORY: Right knee arthroplasty, rotator cuff repair to the right arm, glaucoma, back surgery x 4, breast cyst removed, hysterectomy, bladder surgery, and cholecystectomy.    ALLERGIES: NUMEROUS INCLUDE SKELAXIN, CIPRO, VIOXX, SULFA, EVISTA, MORPHINE, OPTICAINE,  TAPE, NORVASC, CELEBREX, ZOCOR, SINGULAIR, ZETIA, LESCOL, AND TOPROL-XL.   HOME MEDICATIONS: Temodar 50 mg every 4 to 6 hours p.r.n., torsemide 5 mg once a day, Synthroid 50 mcg a day, nortriptyline 25 mg 2 times a day, lisinopril 40 mg once a day, gabapentin 300 mg 2 tablets 3 times a day, atenolol 50 mg once a day, aspirin 81 mg a day, albuterol 2 puffs 4 times a day and Advair 2 times a day.   SOCIAL HISTORY: Married, lives with her husband. She previously worked as a Software engineer.   FAMILY HISTORY: Positive for heart disease and uterine cancer.   REVIEW OF SYSTEMS: To be dictated later.   PHYSICAL EXAMINATION:  GENERAL: Elderly white female. She has bruising around her left forehead above her eye.  VITAL SIGNS: Blood pressure 105/52, pulse 83, respirations 16, oxygen saturation 95% on room air.  HEENT: Sclerae anicteric. Conjunctivae negative.   ANTERIOR CHEST: Clear.  HEART: Shows normal S1 and S2. No murmurs I can hear.  ABDOMEN: Bowel sounds are diminished. There is tenderness primarily on the left half of the abdomen to percussion and gentle palpation.  EXTREMITIES: No edema.  NEUROLOGIC: She is alert and oriented to place and city and to family members, whereas earlier she was disoriented.   LABORATORY DATA: Glucose 185, BUN 19, creatinine 1.28, sodium 141, potassium 3.8, chloride 112, CO2 of 24, calcium 6.8. CPK and troponins negative. TSH 0.22. White blood count 8.2, hemoglobin 10.3, hematocrit 31.9, platelet count 130,000. C. difficile is negative. Urine shows no significant  findings. Lactic acid on admission was 2.4. Blood culture showed no growth. White blood count 10.4, hemoglobin 13.1, hematocrit 39.4, these are admission values. On admission her creatinine was 2.35, BUN 39.   Portable chest x-ray shows stable mild cardiomegaly without failure. Left hip complete shows degenerative changes,  but no fracture.   ASSESSMENT: The patient appears to have classic ischemic colitis preceded by diarrhea, developing severe abdominal pain, followed by hypotension resistant to IV fluids and with CAT scan findings consistent with ischemic colitis. She has not had any bleeding, which is often seen with ischemic colitis. She has responded to Levophed and has been able to wean off of it which is encouraging. She could have large vessel disease or small vessel disease with altered regulation of flow through the watershed areas of the left colon.    RECOMMENDATIONS:  1. Continue antibiotics.  2. Vascular consult for possible arteriography. 3. General surgery consult for possible surgery if condition deteriorates. This condition has a significant mortality rate, probably at least 30%.   I will follow with you.    ____________________________ Manya Silvas, MD rte:JT D: 08/10/2014 17:11:13 ET T: 08/10/2014 17:42:47 ET JOB#: [id number]  cc: Manya Silvas, MD, <Dictator> Ocie Cornfield. Ouida Sills, MD Glena Norfolk. Rexene Edison, MD Algernon Huxley, MD  Manya Silvas MD ELECTRONICALLY SIGNED 08/13/2014 12:28

## 2015-02-26 NOTE — H&P (Signed)
PATIENT NAME:  Crystal Delacruz, Crystal Delacruz MR#:  [id number] DATE OF BIRTH:  05-May-1924  DATE OF ADMISSION:  09/10/2014  CHIEF COMPLAINT: Rash on the legs.   HISTORY OF PRESENT ILLNESS: This is an 81 year old female who had a recent stay in the hospital and was discharged out to [facility]. Over the past 4-5 days, she has had a rash on her legs and feet, swelling in her legs, burning and stinging feeling. The last few days, she has been off of prednisone; they thought the prednisone was the issue with her rash. She was recently started back on Demadex, but has taken in this in the past for swelling. They were giving her cream on her legs, which made things worse. The ER physician thought this is was a cellulitis and started a dose of Ancef. The rash is extending up the legs and up the back, into the abdomen, and more diffuse. I do not think it is a cellulitis.   NEW MEDICATION: CARDIZEM CD from the previous hospitalization.   ALLERGIES: SHE DOES HAVE AN ALLERGY TO NORVASC. THE PATIENT ALSO  HAS AN ALLERGY TO TOPROL-XL AND IS ON METOPROLOL. The patient is also on aspirin and has ALLERGIES TO  VIOXX AND CELEBREX, but that looks more like side effects.  PAST MEDICAL HISTORY: Glaucoma, hypertension, neuropathy, hypothyroidism, lower extremity edema, hepatitis, asthma, diabetes, had rapid atrial fibrillation the last time, and ischemic colitis.   PAST SURGICAL HISTORY: Right knee arthroplasty, right rotator cuff repair, glaucoma, back surgery x 4, breast cysts removed, hysterectomy, bladder surgery, cholecystectomy, recent angiogram by Dr. Lucky Cowboy for the ischemic colitis.   ALLERGIES: SKELAXIN, CIPRO, VIOXX, SUGAR SUBSTITUTE, EVISTA, MORPHINE, OPTICAINE TAPE, SULFA, NORVASC, CELEBREX, ZOCOR, SINGULAIR, ZETIA, LESCOL AND TOPROL-XL.   CURRENT MEDICATIONS INCLUDE: Aspirin 81 mg daily, CARDIZEM CD 300 mg daily, Claritin 10 mg daily, Demadex 5 mg daily, Diprolene dipropionate augmented 0.05% topical to back twice a day,  loperamide 2 mg every 6 hours as needed for diarrhea, METOPROLOL 50 mg twice a day, nortriptyline 25 mg twice a day, NovoLog sliding scale, Synthroid 50 mcg daily, Ventolin HFA 1 puff every 4 hours as needed.   SOCIAL HISTORY: Currently at [facility]. No smoking. No alcohol. No drug use. 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   FAMILY HISTORY: Father died of heart failure. Mother died of uterine cancer.   REVIEW OF SYSTEMS:  CONSTITUTIONAL: Positive for fatigue. No fever, chills, or sweats. No weight loss. No weight gain.  EYES: Does wear glasses.  EARS, NOSE, MOUTH AND THROAT: Positive for postnasal drip.  CARDIOVASCULAR SYSTEM: No chest pain. No palpitations.  RESPIRATORY: No shortness of breath, no cough, no sputum, no hemoptysis.  GASTROINTESTINAL: No nausea, no vomiting, no abdominal pain. Positive for alternating constipation and diarrhea. No bright red blood per rectum. No melena.  GENITOURINARY: No burning on urination. No hematuria.  MUSCULOSKELETAL: Leg pain with the swelling and rash.  INTEGUMENT: Diffuse rash throughout the body.  NEUROLOGIC: No fainting or blackouts.  PSYCHIATRIC: On medication for depression.  ENDOCRINE: Positive for hypothyroidism.  HEMATOLOGIC AND LYMPHATIC: No anemia. No easy bruising or bleeding.   PHYSICAL EXAMINATION:  VITAL SIGNS: Temperature 97.7, pulse 69, respirations 18, blood pressure 119/61, pulse oximetry 98% on room air.  GENERAL: No respiratory distress.  EYES: Conjunctivae and lids normal. Pupils equal, round, and reactive to light. Extraocular muscles intact. No nystagmus.  EARS, NOSE, MOUTH AND THROAT: Tympanic Membranes: No erythema. Nasal Mucosa: No erythema. Throat: No erythema. No exudate seen.  Lips and Gums: No lesions.  NECK: No JVD. No bruits. No lymphadenopathy. No thyromegaly. No thyroid nodules palpated.  LUNGS: Clear to auscultation. No use of accessory muscles to breathe. No rhonchi, rales, or wheeze heard.   CARDIOVASCULAR: S1, S2 normal. No gallops, rubs, or murmurs heard. Carotid upstroke 2+ bilaterally. No bruits. Dorsalis pedis pulses 2+ bilaterally. No edema of the lower extremities.   ABDOMEN: Soft, nontender. No organomegaly/splenomegaly. Normoactive bowel sounds. No masses felt.  LYMPHATIC: No lymph nodes in the neck.  MUSCULOSKELETAL: No clubbing. 3+ edema. No cyanosis.  SKIN: No ulcers seen. The patient does have diffuse erythema of the lower extremities that sort of gets a little less as it goes up into the thighs, but I am seeing it also on the back. More like a reticular pattern. Some blanching. She does have some blistering on the toes.  NEUROLOGIC: Cranial nerves II through XII grossly intact. Deep tendon reflexes 2+, bilateral lower extremities.  PSYCHIATRIC: The patient is oriented to person, place, and time.   LABORATORY AND RADIOLOGICAL DATA: White blood cell count 9.9, H and H 10.7 and 33.5, platelet count of 232,000. Glucose 169, BUN 20, creatinine 1.05, sodium 143, potassium 3.5, chloride 106, CO2 of 29, calcium 7.9, albumin low at 2.3.   ASSESSMENT AND PLAN:  1.  I think this is a diffuse allergic reaction skin rash, likely related to a medication. The family and patient think it is related to the steroids that she has, and do not want any steroids with regard to the treatment. I will stop that cream that they were given. I will not give any IV steroids at this point. I think it is probably related to the [facility] CD, which is a new medication. This could also be related to the METOPROLOL, because she does have an ALLERGY TO TOPROL. I will let Dr. Ouida Sills try to figure that out, if the rash does not get better with stopping of the CARDIZEM. The patient also states that she is now taking a generic form of Synthroid, which could also be an issue, but we will continue the Synthroid at this point. The patient is on aspirin. HER SIDE EFFECTS TO THE VIOXX AND CELEBREX LOOK LIKE ADVERSE  REACTIONS RATHER THAN ALLERGIC REACTIONS, so I will continue the aspirin at this point. Continue to monitor. Elevate the legs.  2.  History of atrial fibrillation; normal sinus rhythm right now. With stopping the CARDIZEM, need to watch if the patient goes back into atrial fibrillation. Continue the METOPROLOL at this point.  3.  Essential hypertension. Continue METOPROLOL. 4.  Diabetes with neuropathy. Continue sliding scale at this point.  5.  Lower extremity edema. The patient wants to continue Demadex, which is not a new medication for her. Will continue.  6.  Asthma. Respiratory status is stable at this point. Continue inhalers.   TIME SPENT ON ADMISSION: 55 minutes.   CODE STATUS: The patient is a Full Code.    ____________________________ Tana Conch. Leslye Peer, MD rjw:MT D: 09/10/2014 15:44:34 ET T: 09/10/2014 15:56:35 ET JOB#: [id number]  cc: Tana Conch. Leslye Peer, MD, <Dictator> Marisue Brooklyn MD ELECTRONICALLY SIGNED 09/16/2014 1:39

## 2015-02-26 NOTE — Consult Note (Signed)
Chief Complaint:  Subjective/Chief Complaint seen for colitis, likely ischemic.  Paitent doing some better, tolerating diet without abdominal pain.  small brown bm this am, no rectal bleeding.  No abdominal pain currently.  no n/v.   VITAL SIGNS/ANCILLARY NOTES: **Vital Signs.:   11-Oct-15 12:10  Vital Signs Type Q 4hr  Temperature Temperature (F) 98.4  Celsius 36.8  Pulse Pulse 87  Respirations Respirations 17  Systolic BP Systolic BP 154  Diastolic BP (mmHg) Diastolic BP (mmHg) 76  Mean BP 102  Pulse Ox % Pulse Ox % 95  Pulse Ox Activity Level  At rest  Oxygen Delivery 4L  *Intake and Output.:   11-Oct-15 04:04  Stool  small brown loose stool    04:28  Stool  0    05:45  Stool  0    06:07  Stool  0   Brief Assessment:  Cardiac Irregular   Respiratory clear BS   Gastrointestinal details normal Soft  Nontender  Nondistended  Bowel sounds normal   Lab Results: Routine Chem:  11-Oct-15 03:39   Glucose, Serum  147  BUN  5  Creatinine (comp) 0.73  Sodium, Serum 140  Potassium, Serum 3.5  Chloride, Serum 104  CO2, Serum 29  Calcium (Total), Serum  7.3  Anion Gap 7  Osmolality (calc) 279  eGFR (African American) >60  eGFR (Non-African American) >60 (eGFR values <60mL/min/1.73 m2 may be an indication of chronic kidney disease (CKD). Calculated eGFR, using the MRDR Study equation, is useful in  patients with stable renal function. The eGFR calculation will not be reliable in acutely ill patients when serum creatinine is changing rapidly. It is not useful in patients on dialysis. The eGFR calculation may not be applicable to patients at the low and high extremes of body sizes, pregnant women, and vetetarians.)  Routine Hem:  04-Oct-15 23:48   Hemoglobin (CBC) 13.1  Platelet Count (CBC) 189 (Result(s) reported on 09 Aug 2014 at 12:05AM.)  06-Oct-15 04:33   Hemoglobin (CBC)  10.3  Platelet Count (CBC)  130  07-Oct-15 04:23   Hemoglobin (CBC)  10.1  Platelet  Count (CBC)  114  08-Oct-15 04:50   Hemoglobin (CBC)  11.1  Platelet Count (CBC) 152  09-Oct-15 04:31   Hemoglobin (CBC)  11.4  Platelet Count (CBC) 202  11-Oct-15 03:39   WBC (CBC) 7.6  RBC (CBC)  3.50  Hemoglobin (CBC)  10.7  Hematocrit (CBC)  33.6  Platelet Count (CBC) 228  MCV 96  MCH 30.5  MCHC  31.9  RDW 13.3  Neutrophil % 63.3  Lymphocyte % 17.5  Monocyte % 13.3  Eosinophil % 5.5  Basophil % 0.4  Neutrophil # 4.8  Lymphocyte # 1.3  Monocyte #  1.0  Eosinophil # 0.4  Basophil # 0.0 (Result(s) reported on 15 Aug 2014 at 04:03AM.)   Assessment/Plan:  Assessment/Plan:  Assessment 1) colitis, likely ischemic.  symptoms improving.  tolerating po without sx.   Plan 1) continue current. With probable small vessel disease,  possible use for trental, will d/w vascular surgery in am.  following.   Electronic Signatures: Skulskie, Martin (MD)  (Signed 11-Oct-15 16:27)  Authored: Chief Complaint, VITAL SIGNS/ANCILLARY NOTES, Brief Assessment, Lab Results, Assessment/Plan   Last Updated: 11-Oct-15 16:27 by Skulskie, Martin (MD) 

## 2015-02-26 NOTE — Consult Note (Signed)
Pt had colonoscopy in 2013 showing probable ischemic colitis in descending and sigmoid area.  Had hospitalization 2014 for "colitis".   Electronic Signatures: Manya Silvas (MD)  (Signed on 06-Oct-15 17:53)  Authored  Last Updated: 06-Oct-15 17:53 by Manya Silvas (MD)

## 2015-02-26 NOTE — Op Note (Signed)
PATIENT NAME:  Crystal Delacruz, Crystal Delacruz MR#:  [id number] DATE OF BIRTH:  07/10/1924  DATE OF PROCEDURE:  08/11/2014  PREOPERATIVE DIAGNOSIS:  Ischemic colitis.   POSTOPERATIVE DIAGNOSIS: Ischemic colitis.  PROCEDURE: 1.   Ultrasound guidance for vascular access to left femoral artery.  2.  Catheter placement into superior mesenteric artery and inferior mesenteric artery from left femoral approach.  3. Aortogram and selective superior mesenteric artery and inferior mesenteric artery arteriograms. 4.  StarClose closure device, left femoral artery.   SURGEON: Algernon Huxley, MD   ANESTHESIA: Local with moderate conscious sedation.   BLOOD LOSS: 25 mL.   INDICATION FOR PROCEDURE: This is an 81 year old female who was admitted 2 days prior with severe colitis. Due to location ischemic colitis was considered to be the leading diagnosis. She had been seen both by gastroenterology and general surgery who felt angiography would be helpful in delineating her disease and determining treatment options. I discussed with the patient the procedure, risks, and benefits were discussed. Informed consent was obtained.   DESCRIPTION OF PROCEDURE: The patient is brought to the vascular suite. Groins were shaved and prepped, and a sterile surgical field was created. The left femoral artery is visualized on ultrasound and found to be widely patent. It was then accessed under direct ultrasound guidance without difficulty with Seldinger needle and a permanent image was recorded. A J-wire and 5 French sheath were placed. Pigtail catheter was placed into the aorta at the T12-L1 level and an AP and then LAO projection aortogram was performed. This demonstrated from initial imaging what appeared to be typical origins of the celiac and superior mesenteric artery. The C2 catheter was then used to selectively cannulate the superior mesenteric artery and selective SMA angiography was then performed. This demonstrated no significant disease or  stenosis within the superior mesenteric artery with brisk flow through the superior mesenteric artery and its main primary branches.   I then turned my attention to the IMA. We came out of the lateral projection and went into a steep RAO projection and a pigtail catheter was replaced in the aorta just below the renal arteries. Aortogram was performed which showed the origin of the IMA. For further evaluation, a VS1 catheter was used to selectively cannulate the IMA and imaging was performed of the IMA through the selective VS1 catheterization. This demonstrated an IMA with minimal stenosis proximally that was  not significant, brisk flow through the IMA and its primary branches with a large branch leading to the left colon and splenic flexure. At this point there were no primary vessel stenoses or lesions identified causing lack of flow, and therefore no vascular intervention was necessary. I elected to terminate the procedure. The diagnostic catheter was removed. Oblique arteriogram was performed of the left femoral artery and the StarClose closure device was deployed in the usual fashion with excellent hemostatic result. The patient tolerated the procedure well and was taken to the recovery room in stable condition.    ____________________________ Algernon Huxley, MD jsd:LT D: 08/11/2014 18:29:55 ET T: 08/11/2014 20:59:10 ET JOB#: [id number]  cc: Algernon Huxley, MD, <Dictator> Manya Silvas, MD Glena Norfolk Rexene Edison, MD [place] Ouida Sills, MD Algernon Huxley MD ELECTRONICALLY SIGNED 08/21/2014 12:37

## 2015-02-26 NOTE — Consult Note (Signed)
PATIENT NAME:  Crystal Delacruz, Crystal Delacruz MR#:  [id number] DATE OF BIRTH:  [birth year]  DATE OF CONSULTATION:  08/10/2014  REFERRING PHYSICIAN:   CONSULTING PHYSICIAN:  Harrell Gave A. Jelitza Manninen, MD  REASON FOR CONSULTATION: Confusion, diarrhea, CT findings consistent with recurrent colitis possibly ischemic.   HISTORY OF PRESENT ILLNESS:  Crystal Delacruz is a pleasant 81 year old female with a history of diabetes mellitus, hypertension, who has been admitted multiple times for colitis. She has a history of constipation, in this particular case she took a Dulcolax and then had multiple bouts of diarrhea. She became disoriented and was brought in to the Emergency Room. Upon admission to the Emergency Room she was found to be hypotensive, received fluid and was begun on Levophed.  She also fell down about a week ago and workup has been negative. Also noted to be in acute renal failure, noted to have a fever of 101 in the Emergency Department. Currently still has pain in her left lower quadrant, is no longer on pressors.  No nausea, vomiting, current fevers, chills, night sweats, shortness of breath, cough, chest pain. According to chart is continuing to have loose stools, has 2 recorded today since 5:00.   PAST MEDICAL HISTORY:  1. History of GI bleed and recurrent colitis.  2. Hypertension.  3. Glaucoma.  4. Neuropathy.  5. Hypothyroidism.  6. History of lower extremity edema.  7. History of hepatitis.  8. History of asthma.   9. History of arthroplasty of the knee.  10. History of rotator cuff repair of the right arm.  11. History of back surgery.  12. History of breast cyst removal.  13. History of hysterectomy.  14. History of bladder surgery.  15. History of cholecystectomy.   ALLERGIES: SKELAXIN, CIPRO, VIOXX, EVISTA, MORPHINE, OPTICAINE, TAPE, SULFA, NORVASC, CELEBREX, ZOCOR, SINGULAIR, ZETIA, LESCOL, AND TOPROL-XL.   HOME MEDICATIONS: Torsemide, Synthroid, nortriptyline, lisinopril, gabapentin, atenolol,  aspirin, albuterol, Advair Diskus.   SOCIAL HISTORY: Denies tobacco, alcohol, or drugs. Lives with her husband who is the primary caregiver.   FAMILY HISTORY: Father died of heart failure, mother died of uterine cancer.   REVIEW OF SYSTEMS: 12 point review of systems obtained. Pertinent positives and negatives as above.   PHYSICAL EXAMINATION:   VITAL SIGNS: Currently temperature 98.4, pulse 83, blood pressure 105/52, respirations 17, 95% on room air.  GENERAL: No acute distress, alert and oriented x 3.  HEENT:  Head normocephalic, atraumatic.  Does have left-sided facial wound.  Eyes, does have some ecchymosis around her eyes superior and inferior to that.  Normal external nose. Normal external ears.  CHEST:  Lungs clear to auscultation. Moving air well.  HEART: Regular rate and rhythm. No murmurs, rubs, or gallops.   ABDOMEN: Soft, mildly tender to palpation in the left lower quadrant into the left upper quadrant, but less so. Nondistended.  EXTREMITIES: Moves all extremities well. Strength 5 out of 5.  NEUROLOGIC: Cranial nerves II-XII grossly intact.  MUSCULOSKELETAL: Sensation and strength intact all 4 extremities.   LABORATORY DATA: Significant for current white cell count of 8.2, was 10.4 on admission, neutrophils are 77%, platelets are 130,000, hemoglobin is 10.3, down from 13.1, but did receive significant resuscitation. Creatinine currently is 1.28, down from 2.35 on admission, BUN is 19, down from 39, bicarbonate is 24. Lactate at admission was 2.4.  Clostridium difficile is negative. CT scan shows a thickened distal transverse, descending, and sigmoid colon with no obvious pneumatosis.   ASSESSMENT AND PLAN: Crystal Delacruz is a pleasant 81 year old  female with a history of recurrent diarrhea and colitis. Due to location and recurrence I favor ischemic. Vascular has been involved due to recurrence, may benefit from angiography. No obvious surgical indications for resection at this time.  Will continue to follow.    ____________________________ Glena Norfolk Crystal Moree, MD cal:bu D: 08/10/2014 16:34:11 ET T: 08/10/2014 17:40:58 ET JOB#: [id number]  cc: Harrell Gave A. Crystal Lorenzetti, MD, <Dictator> Crystal Parkins MD ELECTRONICALLY SIGNED 08/23/2014 7:10

## 2015-02-26 NOTE — Consult Note (Signed)
Pt feeling better, breathing better, heart is in sinus rhythm now. Chest clear anterior fields. Pt smiling, wishes for more to eat.  On full liq now.  Will try to advance some more on diet tomorrow.  Dr. Gustavo Lah to cover this weekend and next week. abd less tender on my exam.    Electronic Signatures: Manya Silvas (MD)  (Signed on 09-Oct-15 17:43)  Authored  Last Updated: 09-Oct-15 17:43 by Manya Silvas (MD)

## 2015-02-26 NOTE — Consult Note (Signed)
Chief Complaint:  Subjective/Chief Complaint seen for recent episode of colitis, felt to be ischemic.  minimal abdominal discomfort, frequent shortness of breath. no nausea, tolerating po, but eats little.   VITAL SIGNS/ANCILLARY NOTES: **Vital Signs.:   12-Oct-15 11:15  Vital Signs Type Q 4hr  Temperature Temperature (F) 97.7  Celsius 36.5  Temperature Source oral  Pulse Pulse 80  Respirations Respirations 18  Systolic BP Systolic BP 469  Diastolic BP (mmHg) Diastolic BP (mmHg) 88  Mean BP 110  Pulse Ox % Pulse Ox % 97  Pulse Ox Activity Level  At rest  Oxygen Delivery 4L   Brief Assessment:  Cardiac Irregular   Respiratory clear BS   Gastrointestinal details normal Soft  Nontender  Nondistended  No masses palpable  Bowel sounds normal   Assessment/Plan:  Assessment/Plan:  Assessment 1) colitis, probable ischemic. no large vessel disease on angiogram.  probable small vessel disease. no gross hematochezia on this event.  2)multiple medical issues with htn, asthma, neuropathy, glaucoma, hypothyroidism, previous surgeries-back, bladder, hysterectomy.   Plan 1) recommend trental 400 mg po bid for treatment of possible small vessel disease, discussed with Dr Lucky Cowboy.   Electronic Signatures: Loistine Simas (MD)  (Signed 12-Oct-15 15:20)  Authored: Chief Complaint, VITAL SIGNS/ANCILLARY NOTES, Brief Assessment, Assessment/Plan   Last Updated: 12-Oct-15 15:20 by Loistine Simas (MD)

## 2015-11-26 IMAGING — CT CT HEAD WITHOUT CONTRAST
4 of 10 series · 14 of 47 positions shown, 16 images · non-contrast
Comparison: CT of the cervical spine performed 08/29/2011, CT of
the head performed 03/08/2012, and facial bone radiographs performed
08/02/2012

CLINICAL DATA: Patient fell face first on [REDACTED] at home, and hit
forehead on ground. Left forehead scalp hematoma and occipital
bruising noted. No loss of consciousness. Concern for facial or
cervical spine injury.

EXAM:
CT HEAD WITHOUT CONTRAST
CT MAXILLOFACIAL WITHOUT CONTRAST
CT CERVICAL SPINE WITHOUT CONTRAST
TECHNIQUE: Multidetector CT imaging of the head, cervical spine, and
maxillofacial structures were performed using the standard protocol
without intravenous contrast. Multiplanar CT image reconstructions
of the cervical spine and maxillofacial structures were also
generated.

[Series 10: coronal soft · coronal · 0.35mm/px · 3 of 83 slices shown]
[im 17/83  brain]
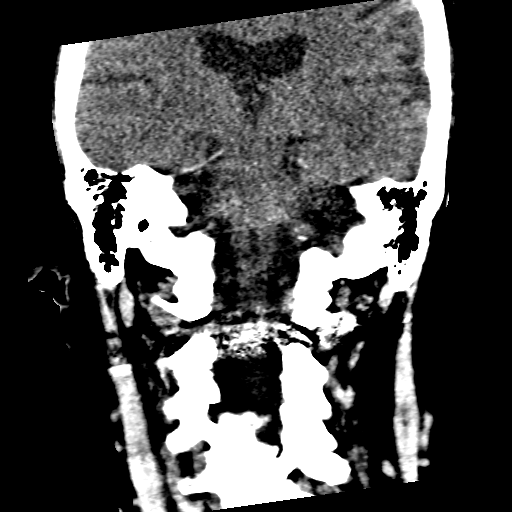
[im 33/83  brain]
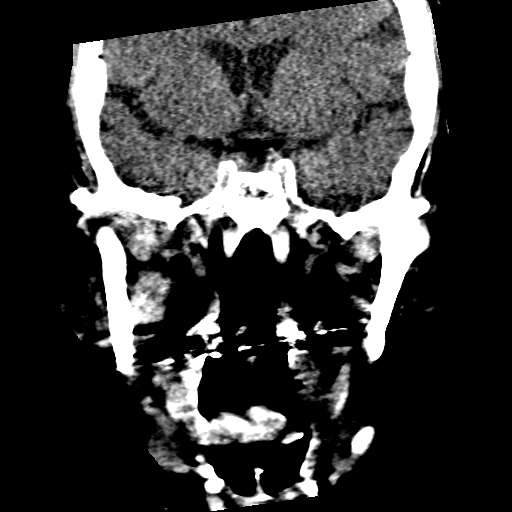
[im 50/83  brain]
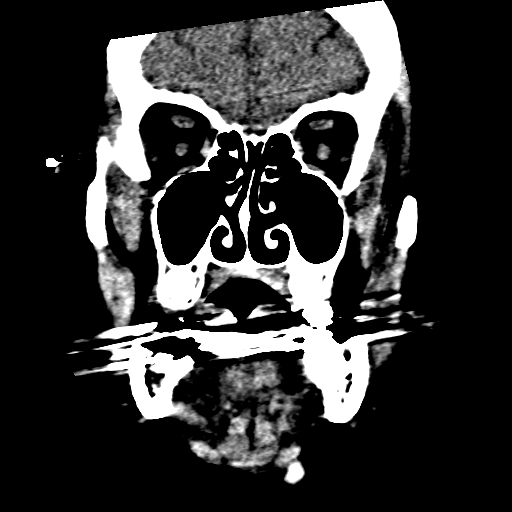

[Series 11: sagittal soft · sagittal · 0.35mm/px · 2 of 76 slices shown]
[im 26/76  brain]
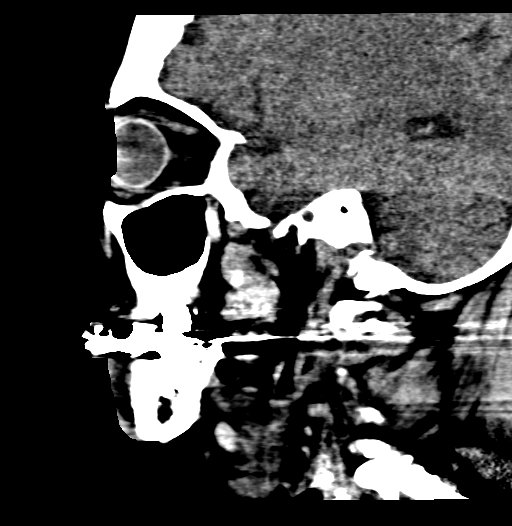
[im 51/76  brain]
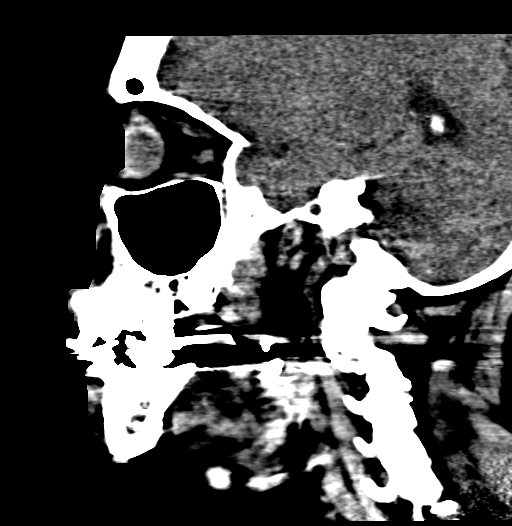

[Series 15: soft tissue · axial · 0.34mm/px · z∈[-291,-227]mm · 3 of 96 slices shown]
[im 16/96  brain]
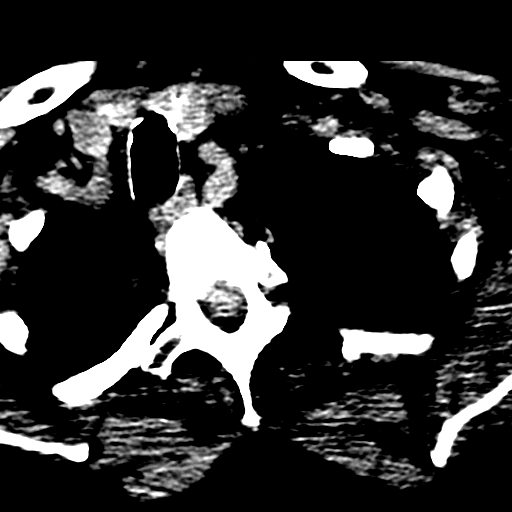
[im 32/96  brain]
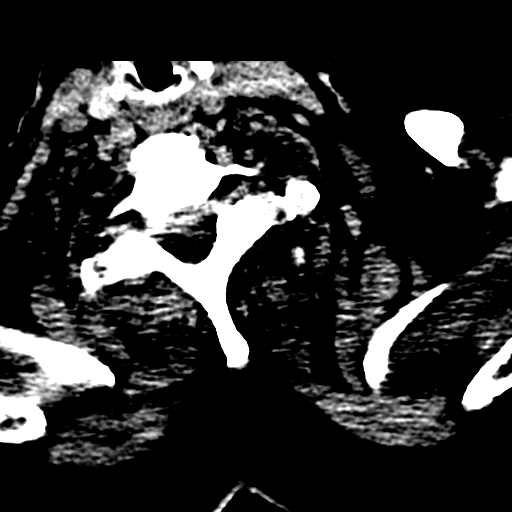
[im 48/96  brain]
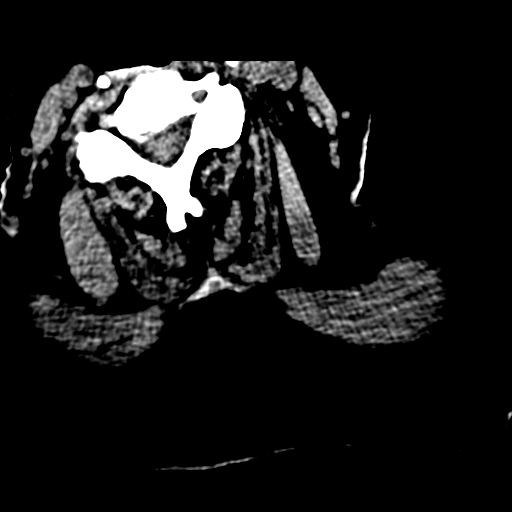

[Series 20: axial · axial · 0.22mm/px · z∈[-303,-171]mm · 6 of 98 slices shown, 8 images]
[im 14/98  brain]
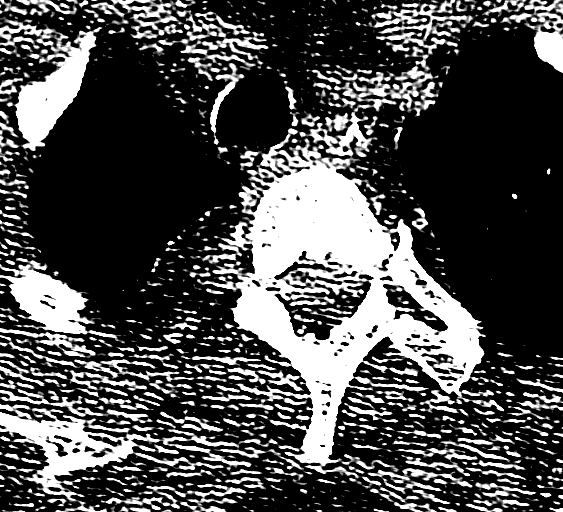
[im 14/98  bone]
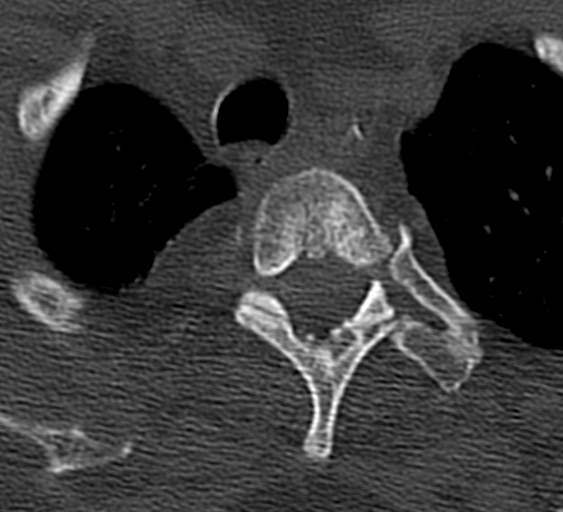
[im 28/98  brain]
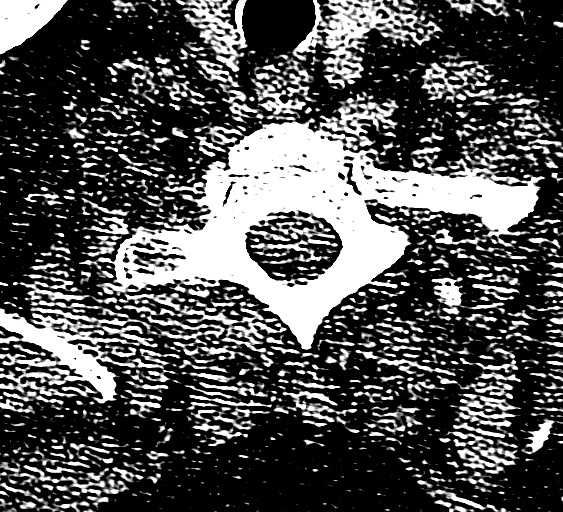
[im 42/98  brain]
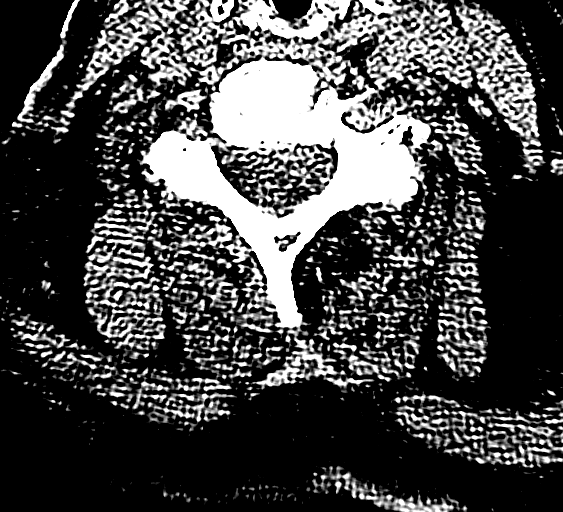
[im 56/98  brain]
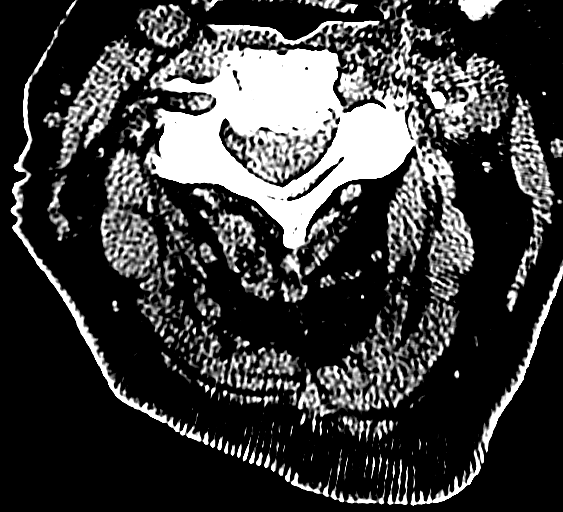
[im 70/98  brain]
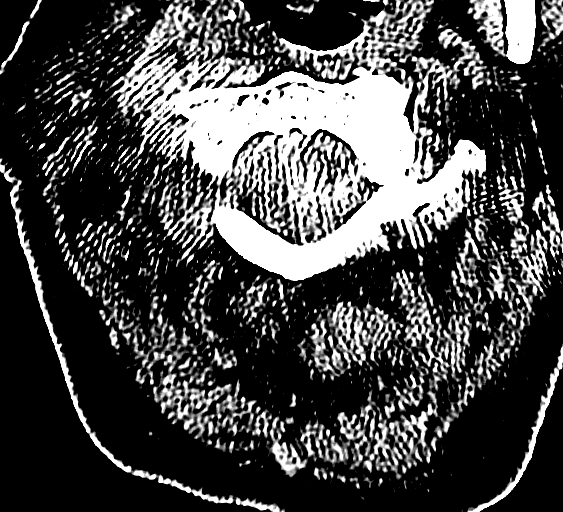
[im 70/98  bone]
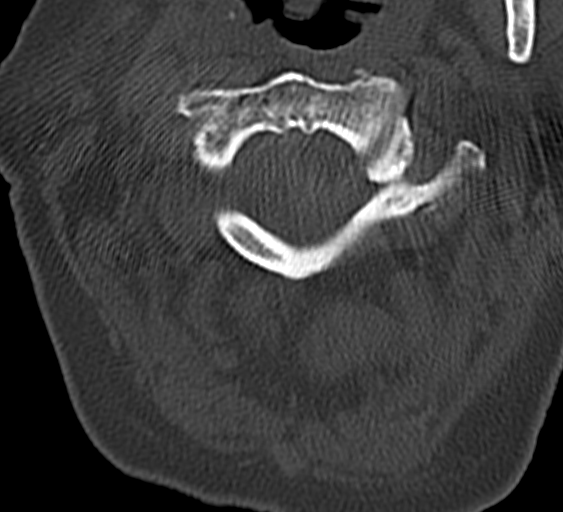
[im 84/98  brain]
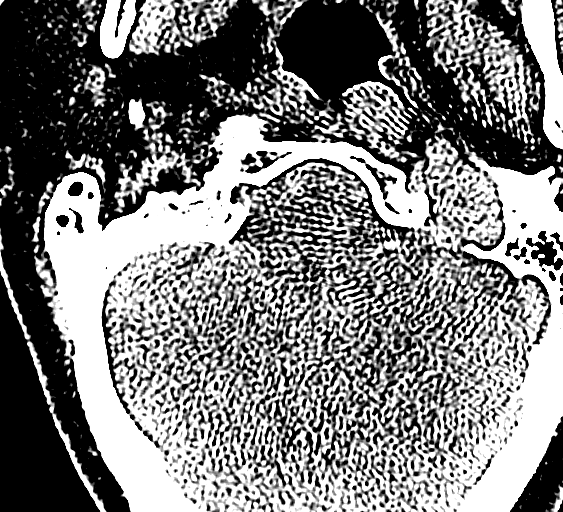

[14 of 47 positions shown; findings below may reference images not displayed]

FINDINGS: CT HEAD FINDINGS

There is no evidence of acute infarction, mass lesion, or intra- or
extra-axial hemorrhage on CT.

Mild periventricular white matter change likely reflects small
vessel ischemic microangiopathy.

The posterior fossa, including the cerebellum, brainstem and fourth
ventricle, is within normal limits. The third and lateral
ventricles, and basal ganglia are unremarkable in appearance. The
cerebral hemispheres are symmetric in appearance, with normal
gray-white differentiation. No mass effect or midline shift is seen.

There is no evidence of fracture; visualized osseous structures are
unremarkable in appearance. The orbits are within normal limits. A
small mucus retention cyst or polyp is noted within the right
maxillary sinus. The remaining paranasal sinuses and mastoid air
cells are well-aerated. No significant soft tissue abnormalities are
seen.

CT MAXILLOFACIAL FINDINGS

There is no evidence of fracture or dislocation. The maxilla and
mandible appear intact. The nasal bone is unremarkable in
appearance. The visualized dentition demonstrates no acute
abnormality.

The orbits are intact bilaterally. The visualized paranasal sinuses
and mastoid air cells are well-aerated.

No significant soft tissue abnormalities are seen. The
parapharyngeal fat planes are preserved. The nasopharynx, oropharynx
and hypopharynx are unremarkable in appearance. The visualized
portions of the valleculae and piriform sinuses are grossly
unremarkable.

The parotid and submandibular glands are within normal limits. No
cervical lymphadenopathy is seen.

CT CERVICAL SPINE FINDINGS

There is no evidence of acute fracture or subluxation. There is
grade 1 anterolisthesis of C5 on C6 and of C6 on C7, and grade 1
retrolisthesis of C3 on C4, with multilevel disc space narrowing,
and anterior and posterior disc osteophyte complexes. Mild
underlying facet disease is noted. Prevertebral soft tissues are
within normal limits.

The thyroid gland is unremarkable in appearance. Minimal atelectasis
is noted at the lung apices. Calcification is noted at the left
carotid bifurcation.
IMPRESSION: 1. No evidence of traumatic intracranial injury or fracture.
2. No evidence of fracture or dislocation with regard to the
maxillofacial structures.
3. No evidence of acute fracture or subluxation along the cervical
spine.
4. Degenerative change again noted along the cervical spine.
5. Small mucus retention cyst or polyp within the right maxillary
sinus.
6. Minimal atelectasis at the lung apices.
7. Calcification noted at the left carotid bifurcation. Carotid
ultrasound could be considered for further evaluation, when and as
deemed clinically appropriate.

## 2015-12-08 DIAGNOSIS — L03031 Cellulitis of right toe: Secondary | ICD-10-CM | POA: Diagnosis not present

## 2015-12-12 DIAGNOSIS — E1142 Type 2 diabetes mellitus with diabetic polyneuropathy: Secondary | ICD-10-CM | POA: Diagnosis not present

## 2015-12-12 DIAGNOSIS — L600 Ingrowing nail: Secondary | ICD-10-CM | POA: Diagnosis not present

## 2016-01-07 ENCOUNTER — Emergency Department: Payer: PPO

## 2016-01-07 ENCOUNTER — Emergency Department
Admission: EM | Admit: 2016-01-07 | Discharge: 2016-01-07 | Disposition: A | Discharge status: Home / Self Care | Payer: PPO | Attending: Emergency Medicine | Admitting: Emergency Medicine

## 2016-01-07 ENCOUNTER — Encounter: Payer: Self-pay | Admitting: Emergency Medicine

## 2016-01-07 DIAGNOSIS — J111 Influenza due to unidentified influenza virus with other respiratory manifestations: Secondary | ICD-10-CM | POA: Diagnosis not present

## 2016-01-07 DIAGNOSIS — B349 Viral infection, unspecified: Secondary | ICD-10-CM | POA: Diagnosis not present

## 2016-01-07 DIAGNOSIS — E119 Type 2 diabetes mellitus without complications: Secondary | ICD-10-CM | POA: Insufficient documentation

## 2016-01-07 DIAGNOSIS — R4182 Altered mental status, unspecified: Secondary | ICD-10-CM | POA: Diagnosis not present

## 2016-01-07 DIAGNOSIS — R509 Fever, unspecified: Secondary | ICD-10-CM | POA: Diagnosis not present

## 2016-01-07 HISTORY — DX: Type 2 diabetes mellitus without complications: E11.9

## 2016-01-07 LAB — URINALYSIS COMPLETE WITH MICROSCOPIC (ARMC ONLY)
BILIRUBIN URINE: NEGATIVE
Bacteria, UA: NONE SEEN
Glucose, UA: NEGATIVE mg/dL
HGB URINE DIPSTICK: NEGATIVE
Ketones, ur: NEGATIVE mg/dL
Leukocytes, UA: NEGATIVE
Nitrite: NEGATIVE
PH: 6 (ref 5.0–8.0)
Protein, ur: NEGATIVE mg/dL
SPECIFIC GRAVITY, URINE: 1.015 (ref 1.005–1.030)

## 2016-01-07 LAB — COMPREHENSIVE METABOLIC PANEL
ALT: 17 U/L (ref 14–54)
AST: 30 U/L (ref 15–41)
Albumin: 3.8 g/dL (ref 3.5–5.0)
Alkaline Phosphatase: 73 U/L (ref 38–126)
Anion gap: 11 (ref 5–15)
BUN: 31 mg/dL — AB (ref 6–20)
CHLORIDE: 105 mmol/L (ref 101–111)
CO2: 24 mmol/L (ref 22–32)
CREATININE: 1.44 mg/dL — AB (ref 0.44–1.00)
Calcium: 8.9 mg/dL (ref 8.9–10.3)
GFR calc non Af Amer: 31 mL/min — ABNORMAL LOW (ref >60)
GFR, EST AFRICAN AMERICAN: 36 mL/min — AB (ref >60)
Glucose, Bld: 161 mg/dL — ABNORMAL HIGH (ref 65–99)
Potassium: 3.8 mmol/L (ref 3.5–5.1)
SODIUM: 140 mmol/L (ref 135–145)
Total Bilirubin: 0.6 mg/dL (ref 0.3–1.2)
Total Protein: 6.8 g/dL (ref 6.5–8.1)

## 2016-01-07 LAB — CBC
HCT: 38.5 % (ref 35.0–47.0)
Hemoglobin: 12.7 g/dL (ref 12.0–16.0)
MCH: 31.3 pg (ref 26.0–34.0)
MCHC: 33.1 g/dL (ref 32.0–36.0)
MCV: 94.7 fL (ref 80.0–100.0)
PLATELETS: 138 10*3/uL — AB (ref 150–440)
RBC: 4.07 MIL/uL (ref 3.80–5.20)
RDW: 14 % (ref 11.5–14.5)
WBC: 9.7 10*3/uL (ref 3.6–11.0)

## 2016-01-07 LAB — RAPID INFLUENZA A&B ANTIGENS
Influenza A (ARMC): NEGATIVE
Influenza B (ARMC): NEGATIVE

## 2016-01-07 LAB — TROPONIN I: Troponin I: <0.03 ng/mL (ref <0.031)

## 2016-01-07 MED ORDER — ACETAMINOPHEN 325 MG PO TABS
650.0000 mg | ORAL_TABLET | Freq: Once | ORAL | Status: AC
  Administered 2016-01-07: 650 mg via ORAL
  Filled 2016-01-07: qty 2

## 2016-01-07 MED ORDER — SODIUM CHLORIDE 0.9 % IV SOLN
1000.0000 mL | Freq: Once | INTRAVENOUS | Status: AC
  Administered 2016-01-07: 1000 mL via INTRAVENOUS

## 2016-01-07 NOTE — Discharge Instructions (Signed)
Viral Infections °A viral infection can be caused by different types of viruses. Most viral infections are not serious and resolve on their own. However, some infections may cause severe symptoms and may lead to further complications. °SYMPTOMS °Viruses can frequently cause: °· Minor sore throat. °· Aches and pains. °· Headaches. °· Runny nose. °· Different types of rashes. °· Watery eyes. °· Tiredness. °· Cough. °· Loss of appetite. °· Gastrointestinal infections, resulting in nausea, vomiting, and diarrhea. °These symptoms do not respond to antibiotics because the infection is not caused by bacteria. However, you might catch a bacterial infection following the viral infection. This is sometimes called a "superinfection." Symptoms of such a bacterial infection may include: °· Worsening sore throat with pus and difficulty swallowing. °· Swollen neck glands. °· Chills and a high or persistent fever. °· Severe headache. °· Tenderness over the sinuses. °· Persistent overall ill feeling (malaise), muscle aches, and tiredness (fatigue). °· Persistent cough. °· Yellow, green, or brown mucus production with coughing. °HOME CARE INSTRUCTIONS  °· Only take over-the-counter or prescription medicines for pain, discomfort, diarrhea, or fever as directed by your caregiver. °· Drink enough water and fluids to keep your urine clear or pale yellow. Sports drinks can provide valuable electrolytes, sugars, and hydration. °· Get plenty of rest and maintain proper nutrition. Soups and broths with crackers or rice are fine. °SEEK IMMEDIATE MEDICAL CARE IF:  °· You have severe headaches, shortness of breath, chest pain, neck pain, or an unusual rash. °· You have uncontrolled vomiting, diarrhea, or you are unable to keep down fluids. °· You or your child has an oral temperature above 102° F (38.9° C), not controlled by medicine. °· Your baby is older than 3 months with a rectal temperature of 102° F (38.9° C) or higher. °· Your baby is 3  months old or younger with a rectal temperature of 100.4° F (38° C) or higher. °MAKE SURE YOU:  °· Understand these instructions. °· Will watch your condition. °· Will get help right away if you are not doing well or get worse. °  °This information is not intended to replace advice given to you by your health care provider. Make sure you discuss any questions you have with your health care provider. °  °Document Released: 08/01/2005 Document Revised: 01/14/2012 Document Reviewed: 03/30/2015 °Elsevier Interactive Patient Education ©2016 [employer] ° °

## 2016-01-07 NOTE — ED Provider Notes (Signed)
[facility] Emergency Department Provider Note  ____________________________________________    I have reviewed the triage vital signs and the nursing notes.   HISTORY  Chief Complaint Influenza    HPI Crystal Delacruz is a 82 y.o. female who presents with complaints of fever. Patient was apparently seen at [facility] and diagnosed with influenza and prescribed Tamiflu. Family brought the patient in to be evaluated because her temperature increased. Patient denies shortness of breath. No cough. No rash. She complains of body aches and feeling weak. No chest pain or dizziness. No nausea or vomiting. No dysuria     Past Medical History  Diagnosis Date  . Diabetes mellitus without complication ([place])     There are no active problems to display for this patient.   History reviewed. No pertinent past surgical history.  No current outpatient prescriptions on file.  Allergies Sulfur  History reviewed. No pertinent family history.  Social History Social History  Substance Use Topics  . Smoking status: Never Smoker   . Smokeless tobacco: None  . Alcohol Use: No    Review of Systems  Constitutional: Positive for fever Eyes: Negative for discharge ENT: Negative for sore throat Cardiovascular: Negative for chest pain. Respiratory: Negative for shortness of breath. Negative for cough Gastrointestinal: Negative for abdominal pain, vomiting and diarrhea. Genitourinary: Negative for dysuria. Musculoskeletal: Positive for muscle aches Skin: Negative for rash. Neurological: Negative for headaches Psychiatric: No anxiety    ____________________________________________   PHYSICAL EXAM:  VITAL SIGNS: ED Triage Vitals  Enc Vitals Group     BP 01/07/16 1819 112/47 mmHg     Pulse Rate 01/07/16 1819 97     Resp 01/07/16 1819 27     Temp 01/07/16 1819 101.5 F (38.6 C)     Temp Source 01/07/16 1819 Oral     SpO2 01/07/16 1819 97 %     Weight 01/07/16  1819 184 lb (83.462 kg)     Height 01/07/16 1819 5\' 5"  (1.651 m)     Head Cir --      Peak Flow --      Pain Score 01/07/16 1821 5     Pain Loc --      Pain Edu? --      Excl. in [place]? --      Constitutional: Alert and oriented. Well appearing and in no distress. Eyes: Conjunctivae are normal.  ENT   Head: Normocephalic and atraumatic.   Mouth/Throat: Mucous membranes are moist. Cardiovascular: Normal rate, regular rhythm. Normal and symmetric distal pulses are present in all extremities.  Respiratory: Normal respiratory effort without tachypnea nor retractions. Breath sounds are clear and equal bilaterally.  Gastrointestinal: Soft and non-tender in all quadrants. No distention. There is no CVA tenderness. Genitourinary: deferred Musculoskeletal: Nontender with normal range of motion in all extremities.  Neurologic:  Normal speech and language. No gross focal neurologic deficits are appreciated. Skin:  Skin is warm, dry and intact. No rash noted. Psychiatric: Mood and affect are normal. Patient exhibits appropriate insight and judgment.  ____________________________________________    LABS (pertinent positives/negatives)  Labs Reviewed  CBC - Abnormal; Notable for the following:    Platelets 138 (*)    All other components within normal limits  COMPREHENSIVE METABOLIC PANEL - Abnormal; Notable for the following:    Glucose, Bld 161 (*)    BUN 31 (*)    Creatinine, Ser 1.44 (*)    GFR calc non Af Amer 31 (*)    GFR calc  Af Amer 36 (*)    All other components within normal limits  URINALYSIS COMPLETEWITH MICROSCOPIC ([facility] ONLY) - Abnormal; Notable for the following:    Color, Urine YELLOW (*)    APPearance CLEAR (*)    Squamous Epithelial / LPF 0-5 (*)    All other components within normal limits  RAPID INFLUENZA A&B ANTIGENS ([facility] ONLY)  TROPONIN I    ____________________________________________   EKG  ED ECG REPORT I, Lavonia Drafts, the attending  physician, personally viewed and interpreted this ECG.  Date: 01/07/2016 EKG Time: 6:19 PM Rate: 96 Rhythm: normal sinus rhythm QRS Axis: normal Intervals: normal ST/T Wave abnormalities: normal Conduction Disturbances: none Narrative Interpretation: unremarkable   ____________________________________________    RADIOLOGY I have personally reviewed any xrays that were ordered on this patient: Chest x-ray unremarkable  ____________________________________________   PROCEDURES  Procedure(s) performed: none  Critical Care performed: none  ____________________________________________   INITIAL IMPRESSION / ASSESSMENT AND PLAN / ED COURSE  Pertinent labs & imaging results that were available during my care of the patient were reviewed by me and considered in my medical decision making (see chart for details).  Patient presents with fever and suspicion for viral illness. Overall she is well-appearing and interactive. We will start IV fluids check labs, x-ray, urine, rapid influenza and reevaluate.  Patient feels significantly better after fluids. Her vitals are normal she has been resting comfortably. She would prefer to go home to sleep and around bed and I think this is reasonable. I did have a discussion with her and her son regarding return to the emergency department if any worsening of her symptoms. They agree with this plan  ____________________________________________   FINAL CLINICAL IMPRESSION(S) / ED DIAGNOSES  Final diagnoses:  Viral illness     Lavonia Drafts, MD 01/07/16 2253

## 2016-01-07 NOTE — ED Notes (Signed)
Pt arrived by EMS from home with confirmed flu. Pt was seen at [facility] clinic walk-in today and dx with the flu and prescribed tamaflu. Per EMS family stated that the Pt's fever spiked and she won't drink fluids.

## 2016-01-07 NOTE — ED Notes (Signed)
Pt is resting at this time. Pt c/o back pain which is unrelated to pain at this time.

## 2016-01-20 DIAGNOSIS — R0602 Shortness of breath: Secondary | ICD-10-CM | POA: Diagnosis not present

## 2016-01-20 DIAGNOSIS — E1122 Type 2 diabetes mellitus with diabetic chronic kidney disease: Secondary | ICD-10-CM | POA: Diagnosis not present

## 2016-01-20 DIAGNOSIS — N183 Chronic kidney disease, stage 3 (moderate): Secondary | ICD-10-CM | POA: Diagnosis not present

## 2016-01-20 DIAGNOSIS — I700 Atherosclerosis of aorta: Secondary | ICD-10-CM | POA: Diagnosis not present

## 2016-01-20 DIAGNOSIS — E782 Mixed hyperlipidemia: Secondary | ICD-10-CM | POA: Diagnosis not present

## 2016-01-20 DIAGNOSIS — I10 Essential (primary) hypertension: Secondary | ICD-10-CM | POA: Diagnosis not present

## 2016-01-25 DIAGNOSIS — L851 Acquired keratosis [keratoderma] palmaris et plantaris: Secondary | ICD-10-CM | POA: Diagnosis not present

## 2016-01-25 DIAGNOSIS — B351 Tinea unguium: Secondary | ICD-10-CM | POA: Diagnosis not present

## 2016-01-25 DIAGNOSIS — E1142 Type 2 diabetes mellitus with diabetic polyneuropathy: Secondary | ICD-10-CM | POA: Diagnosis not present

## 2016-01-31 DIAGNOSIS — R0602 Shortness of breath: Secondary | ICD-10-CM | POA: Diagnosis not present

## 2016-02-27 DIAGNOSIS — E782 Mixed hyperlipidemia: Secondary | ICD-10-CM | POA: Diagnosis not present

## 2016-02-27 DIAGNOSIS — N183 Chronic kidney disease, stage 3 (moderate): Secondary | ICD-10-CM | POA: Diagnosis not present

## 2016-02-27 DIAGNOSIS — E1122 Type 2 diabetes mellitus with diabetic chronic kidney disease: Secondary | ICD-10-CM | POA: Diagnosis not present

## 2016-02-27 DIAGNOSIS — I700 Atherosclerosis of aorta: Secondary | ICD-10-CM | POA: Diagnosis not present

## 2016-03-05 DIAGNOSIS — E1122 Type 2 diabetes mellitus with diabetic chronic kidney disease: Secondary | ICD-10-CM | POA: Diagnosis not present

## 2016-03-05 DIAGNOSIS — Z23 Encounter for immunization: Secondary | ICD-10-CM | POA: Diagnosis not present

## 2016-03-05 DIAGNOSIS — N183 Chronic kidney disease, stage 3 (moderate): Secondary | ICD-10-CM | POA: Diagnosis not present

## 2016-03-05 DIAGNOSIS — I700 Atherosclerosis of aorta: Secondary | ICD-10-CM | POA: Diagnosis not present

## 2016-03-05 DIAGNOSIS — I10 Essential (primary) hypertension: Secondary | ICD-10-CM | POA: Diagnosis not present

## 2016-03-05 DIAGNOSIS — E782 Mixed hyperlipidemia: Secondary | ICD-10-CM | POA: Diagnosis not present

## 2016-03-05 DIAGNOSIS — E039 Hypothyroidism, unspecified: Secondary | ICD-10-CM | POA: Diagnosis not present

## 2016-04-17 DIAGNOSIS — L570 Actinic keratosis: Secondary | ICD-10-CM | POA: Diagnosis not present

## 2016-04-17 DIAGNOSIS — L720 Epidermal cyst: Secondary | ICD-10-CM | POA: Diagnosis not present

## 2016-04-17 DIAGNOSIS — L304 Erythema intertrigo: Secondary | ICD-10-CM | POA: Diagnosis not present

## 2016-04-17 DIAGNOSIS — L820 Inflamed seborrheic keratosis: Secondary | ICD-10-CM | POA: Diagnosis not present

## 2016-04-17 DIAGNOSIS — L821 Other seborrheic keratosis: Secondary | ICD-10-CM | POA: Diagnosis not present

## 2016-04-26 DIAGNOSIS — H401132 Primary open-angle glaucoma, bilateral, moderate stage: Secondary | ICD-10-CM | POA: Diagnosis not present

## 2016-04-30 DIAGNOSIS — L851 Acquired keratosis [keratoderma] palmaris et plantaris: Secondary | ICD-10-CM | POA: Diagnosis not present

## 2016-04-30 DIAGNOSIS — B351 Tinea unguium: Secondary | ICD-10-CM | POA: Diagnosis not present

## 2016-04-30 DIAGNOSIS — E1142 Type 2 diabetes mellitus with diabetic polyneuropathy: Secondary | ICD-10-CM | POA: Diagnosis not present

## 2016-07-10 DIAGNOSIS — E039 Hypothyroidism, unspecified: Secondary | ICD-10-CM | POA: Diagnosis not present

## 2016-07-10 DIAGNOSIS — E782 Mixed hyperlipidemia: Secondary | ICD-10-CM | POA: Diagnosis not present

## 2016-07-10 DIAGNOSIS — E1122 Type 2 diabetes mellitus with diabetic chronic kidney disease: Secondary | ICD-10-CM | POA: Diagnosis not present

## 2016-07-10 DIAGNOSIS — N183 Chronic kidney disease, stage 3 (moderate): Secondary | ICD-10-CM | POA: Diagnosis not present

## 2016-07-17 DIAGNOSIS — Z23 Encounter for immunization: Secondary | ICD-10-CM | POA: Diagnosis not present

## 2016-07-17 DIAGNOSIS — I10 Essential (primary) hypertension: Secondary | ICD-10-CM | POA: Diagnosis not present

## 2016-07-17 DIAGNOSIS — E039 Hypothyroidism, unspecified: Secondary | ICD-10-CM | POA: Diagnosis not present

## 2016-07-17 DIAGNOSIS — N183 Chronic kidney disease, stage 3 (moderate): Secondary | ICD-10-CM | POA: Diagnosis not present

## 2016-07-17 DIAGNOSIS — Z0000 Encounter for general adult medical examination without abnormal findings: Secondary | ICD-10-CM | POA: Diagnosis not present

## 2016-07-17 DIAGNOSIS — J4520 Mild intermittent asthma, uncomplicated: Secondary | ICD-10-CM | POA: Diagnosis not present

## 2016-07-17 DIAGNOSIS — E1122 Type 2 diabetes mellitus with diabetic chronic kidney disease: Secondary | ICD-10-CM | POA: Diagnosis not present

## 2016-07-17 DIAGNOSIS — I700 Atherosclerosis of aorta: Secondary | ICD-10-CM | POA: Diagnosis not present

## 2016-07-24 DIAGNOSIS — N183 Chronic kidney disease, stage 3 (moderate): Secondary | ICD-10-CM | POA: Diagnosis not present

## 2016-07-24 DIAGNOSIS — R0602 Shortness of breath: Secondary | ICD-10-CM | POA: Diagnosis not present

## 2016-07-24 DIAGNOSIS — E1122 Type 2 diabetes mellitus with diabetic chronic kidney disease: Secondary | ICD-10-CM | POA: Diagnosis not present

## 2016-07-24 DIAGNOSIS — I700 Atherosclerosis of aorta: Secondary | ICD-10-CM | POA: Diagnosis not present

## 2016-07-24 DIAGNOSIS — E782 Mixed hyperlipidemia: Secondary | ICD-10-CM | POA: Diagnosis not present

## 2016-07-24 DIAGNOSIS — I10 Essential (primary) hypertension: Secondary | ICD-10-CM | POA: Diagnosis not present

## 2016-07-27 DIAGNOSIS — H04123 Dry eye syndrome of bilateral lacrimal glands: Secondary | ICD-10-CM | POA: Diagnosis not present

## 2016-07-27 DIAGNOSIS — H353131 Nonexudative age-related macular degeneration, bilateral, early dry stage: Secondary | ICD-10-CM | POA: Diagnosis not present

## 2016-07-31 DIAGNOSIS — E1142 Type 2 diabetes mellitus with diabetic polyneuropathy: Secondary | ICD-10-CM | POA: Diagnosis not present

## 2016-07-31 DIAGNOSIS — B351 Tinea unguium: Secondary | ICD-10-CM | POA: Diagnosis not present

## 2016-08-14 DIAGNOSIS — H04123 Dry eye syndrome of bilateral lacrimal glands: Secondary | ICD-10-CM | POA: Diagnosis not present

## 2016-08-19 DIAGNOSIS — J441 Chronic obstructive pulmonary disease with (acute) exacerbation: Secondary | ICD-10-CM | POA: Diagnosis not present

## 2016-08-30 DIAGNOSIS — J441 Chronic obstructive pulmonary disease with (acute) exacerbation: Secondary | ICD-10-CM | POA: Diagnosis not present

## 2016-08-30 DIAGNOSIS — J40 Bronchitis, not specified as acute or chronic: Secondary | ICD-10-CM | POA: Diagnosis not present

## 2016-10-17 DIAGNOSIS — H401132 Primary open-angle glaucoma, bilateral, moderate stage: Secondary | ICD-10-CM | POA: Diagnosis not present

## 2016-10-23 DIAGNOSIS — H401132 Primary open-angle glaucoma, bilateral, moderate stage: Secondary | ICD-10-CM | POA: Diagnosis not present

## 2016-11-13 DIAGNOSIS — E1142 Type 2 diabetes mellitus with diabetic polyneuropathy: Secondary | ICD-10-CM | POA: Diagnosis not present

## 2016-11-13 DIAGNOSIS — B351 Tinea unguium: Secondary | ICD-10-CM | POA: Diagnosis not present

## 2016-11-20 DIAGNOSIS — I10 Essential (primary) hypertension: Secondary | ICD-10-CM | POA: Diagnosis not present

## 2016-11-20 DIAGNOSIS — E039 Hypothyroidism, unspecified: Secondary | ICD-10-CM | POA: Diagnosis not present

## 2016-11-20 DIAGNOSIS — E1122 Type 2 diabetes mellitus with diabetic chronic kidney disease: Secondary | ICD-10-CM | POA: Diagnosis not present

## 2016-11-20 DIAGNOSIS — I700 Atherosclerosis of aorta: Secondary | ICD-10-CM | POA: Diagnosis not present

## 2016-11-20 DIAGNOSIS — N183 Chronic kidney disease, stage 3 (moderate): Secondary | ICD-10-CM | POA: Diagnosis not present

## 2016-11-27 DIAGNOSIS — E039 Hypothyroidism, unspecified: Secondary | ICD-10-CM | POA: Diagnosis not present

## 2016-11-27 DIAGNOSIS — E782 Mixed hyperlipidemia: Secondary | ICD-10-CM | POA: Diagnosis not present

## 2016-11-27 DIAGNOSIS — I700 Atherosclerosis of aorta: Secondary | ICD-10-CM | POA: Diagnosis not present

## 2016-11-27 DIAGNOSIS — Z0000 Encounter for general adult medical examination without abnormal findings: Secondary | ICD-10-CM | POA: Diagnosis not present

## 2016-11-27 DIAGNOSIS — J4520 Mild intermittent asthma, uncomplicated: Secondary | ICD-10-CM | POA: Diagnosis not present

## 2016-11-27 DIAGNOSIS — E1122 Type 2 diabetes mellitus with diabetic chronic kidney disease: Secondary | ICD-10-CM | POA: Diagnosis not present

## 2016-11-27 DIAGNOSIS — N183 Chronic kidney disease, stage 3 (moderate): Secondary | ICD-10-CM | POA: Diagnosis not present

## 2016-11-27 DIAGNOSIS — I10 Essential (primary) hypertension: Secondary | ICD-10-CM | POA: Diagnosis not present

## 2017-01-08 DIAGNOSIS — I10 Essential (primary) hypertension: Secondary | ICD-10-CM | POA: Diagnosis not present

## 2017-01-08 DIAGNOSIS — L989 Disorder of the skin and subcutaneous tissue, unspecified: Secondary | ICD-10-CM | POA: Diagnosis not present

## 2017-01-08 DIAGNOSIS — N183 Chronic kidney disease, stage 3 (moderate): Secondary | ICD-10-CM | POA: Diagnosis not present

## 2017-01-08 DIAGNOSIS — R600 Localized edema: Secondary | ICD-10-CM | POA: Diagnosis not present

## 2017-01-08 DIAGNOSIS — E1122 Type 2 diabetes mellitus with diabetic chronic kidney disease: Secondary | ICD-10-CM | POA: Diagnosis not present

## 2017-01-10 DIAGNOSIS — I10 Essential (primary) hypertension: Secondary | ICD-10-CM | POA: Diagnosis not present

## 2017-01-10 DIAGNOSIS — I700 Atherosclerosis of aorta: Secondary | ICD-10-CM | POA: Diagnosis not present

## 2017-01-10 DIAGNOSIS — E782 Mixed hyperlipidemia: Secondary | ICD-10-CM | POA: Diagnosis not present

## 2017-02-25 DIAGNOSIS — E1142 Type 2 diabetes mellitus with diabetic polyneuropathy: Secondary | ICD-10-CM | POA: Diagnosis not present

## 2017-02-25 DIAGNOSIS — B351 Tinea unguium: Secondary | ICD-10-CM | POA: Diagnosis not present

## 2017-03-19 DIAGNOSIS — E1142 Type 2 diabetes mellitus with diabetic polyneuropathy: Secondary | ICD-10-CM | POA: Diagnosis not present

## 2017-03-19 DIAGNOSIS — L97521 Non-pressure chronic ulcer of other part of left foot limited to breakdown of skin: Secondary | ICD-10-CM | POA: Diagnosis not present

## 2017-03-21 DIAGNOSIS — E782 Mixed hyperlipidemia: Secondary | ICD-10-CM | POA: Diagnosis not present

## 2017-03-21 DIAGNOSIS — I10 Essential (primary) hypertension: Secondary | ICD-10-CM | POA: Diagnosis not present

## 2017-03-21 DIAGNOSIS — E1122 Type 2 diabetes mellitus with diabetic chronic kidney disease: Secondary | ICD-10-CM | POA: Diagnosis not present

## 2017-03-21 DIAGNOSIS — N183 Chronic kidney disease, stage 3 (moderate): Secondary | ICD-10-CM | POA: Diagnosis not present

## 2017-03-28 DIAGNOSIS — E782 Mixed hyperlipidemia: Secondary | ICD-10-CM | POA: Diagnosis not present

## 2017-03-28 DIAGNOSIS — I10 Essential (primary) hypertension: Secondary | ICD-10-CM | POA: Diagnosis not present

## 2017-03-28 DIAGNOSIS — E1122 Type 2 diabetes mellitus with diabetic chronic kidney disease: Secondary | ICD-10-CM | POA: Diagnosis not present

## 2017-03-28 DIAGNOSIS — R61 Generalized hyperhidrosis: Secondary | ICD-10-CM | POA: Diagnosis not present

## 2017-03-28 DIAGNOSIS — I700 Atherosclerosis of aorta: Secondary | ICD-10-CM | POA: Diagnosis not present

## 2017-03-28 DIAGNOSIS — E039 Hypothyroidism, unspecified: Secondary | ICD-10-CM | POA: Diagnosis not present

## 2017-03-28 DIAGNOSIS — J4520 Mild intermittent asthma, uncomplicated: Secondary | ICD-10-CM | POA: Diagnosis not present

## 2017-03-28 DIAGNOSIS — R918 Other nonspecific abnormal finding of lung field: Secondary | ICD-10-CM | POA: Diagnosis not present

## 2017-03-28 DIAGNOSIS — N183 Chronic kidney disease, stage 3 (moderate): Secondary | ICD-10-CM | POA: Diagnosis not present

## 2017-04-04 DIAGNOSIS — L720 Epidermal cyst: Secondary | ICD-10-CM | POA: Diagnosis not present

## 2017-04-04 DIAGNOSIS — L820 Inflamed seborrheic keratosis: Secondary | ICD-10-CM | POA: Diagnosis not present

## 2017-04-04 DIAGNOSIS — L821 Other seborrheic keratosis: Secondary | ICD-10-CM | POA: Diagnosis not present

## 2017-04-04 DIAGNOSIS — L578 Other skin changes due to chronic exposure to nonionizing radiation: Secondary | ICD-10-CM | POA: Diagnosis not present

## 2017-04-30 DIAGNOSIS — H401132 Primary open-angle glaucoma, bilateral, moderate stage: Secondary | ICD-10-CM | POA: Diagnosis not present

## 2017-05-28 DIAGNOSIS — B351 Tinea unguium: Secondary | ICD-10-CM | POA: Diagnosis not present

## 2017-05-28 DIAGNOSIS — E1142 Type 2 diabetes mellitus with diabetic polyneuropathy: Secondary | ICD-10-CM | POA: Diagnosis not present

## 2017-07-22 DIAGNOSIS — I700 Atherosclerosis of aorta: Secondary | ICD-10-CM | POA: Diagnosis not present

## 2017-07-22 DIAGNOSIS — E1122 Type 2 diabetes mellitus with diabetic chronic kidney disease: Secondary | ICD-10-CM | POA: Diagnosis not present

## 2017-07-22 DIAGNOSIS — E782 Mixed hyperlipidemia: Secondary | ICD-10-CM | POA: Diagnosis not present

## 2017-07-22 DIAGNOSIS — I10 Essential (primary) hypertension: Secondary | ICD-10-CM | POA: Diagnosis not present

## 2017-07-22 DIAGNOSIS — N183 Chronic kidney disease, stage 3 (moderate): Secondary | ICD-10-CM | POA: Diagnosis not present

## 2017-07-23 DIAGNOSIS — I10 Essential (primary) hypertension: Secondary | ICD-10-CM | POA: Diagnosis not present

## 2017-07-23 DIAGNOSIS — R0602 Shortness of breath: Secondary | ICD-10-CM | POA: Diagnosis not present

## 2017-07-23 DIAGNOSIS — E782 Mixed hyperlipidemia: Secondary | ICD-10-CM | POA: Diagnosis not present

## 2017-07-23 DIAGNOSIS — I700 Atherosclerosis of aorta: Secondary | ICD-10-CM | POA: Diagnosis not present

## 2017-07-29 DIAGNOSIS — I700 Atherosclerosis of aorta: Secondary | ICD-10-CM | POA: Diagnosis not present

## 2017-07-29 DIAGNOSIS — E1122 Type 2 diabetes mellitus with diabetic chronic kidney disease: Secondary | ICD-10-CM | POA: Diagnosis not present

## 2017-07-29 DIAGNOSIS — I10 Essential (primary) hypertension: Secondary | ICD-10-CM | POA: Diagnosis not present

## 2017-07-29 DIAGNOSIS — N183 Chronic kidney disease, stage 3 (moderate): Secondary | ICD-10-CM | POA: Diagnosis not present

## 2017-07-29 DIAGNOSIS — E039 Hypothyroidism, unspecified: Secondary | ICD-10-CM | POA: Diagnosis not present

## 2017-07-29 DIAGNOSIS — Z23 Encounter for immunization: Secondary | ICD-10-CM | POA: Diagnosis not present

## 2017-07-29 DIAGNOSIS — E782 Mixed hyperlipidemia: Secondary | ICD-10-CM | POA: Diagnosis not present

## 2017-07-30 DIAGNOSIS — L720 Epidermal cyst: Secondary | ICD-10-CM | POA: Diagnosis not present

## 2017-07-30 DIAGNOSIS — L820 Inflamed seborrheic keratosis: Secondary | ICD-10-CM | POA: Diagnosis not present

## 2017-07-30 DIAGNOSIS — I8310 Varicose veins of unspecified lower extremity with inflammation: Secondary | ICD-10-CM | POA: Diagnosis not present

## 2017-07-30 DIAGNOSIS — L728 Other follicular cysts of the skin and subcutaneous tissue: Secondary | ICD-10-CM | POA: Diagnosis not present

## 2017-08-06 DIAGNOSIS — L720 Epidermal cyst: Secondary | ICD-10-CM | POA: Diagnosis not present

## 2017-08-15 DIAGNOSIS — J4520 Mild intermittent asthma, uncomplicated: Secondary | ICD-10-CM | POA: Diagnosis not present

## 2017-08-15 DIAGNOSIS — J40 Bronchitis, not specified as acute or chronic: Secondary | ICD-10-CM | POA: Diagnosis not present

## 2017-08-15 DIAGNOSIS — E1122 Type 2 diabetes mellitus with diabetic chronic kidney disease: Secondary | ICD-10-CM | POA: Diagnosis not present

## 2017-08-15 DIAGNOSIS — N183 Chronic kidney disease, stage 3 (moderate): Secondary | ICD-10-CM | POA: Diagnosis not present

## 2017-09-09 DIAGNOSIS — B351 Tinea unguium: Secondary | ICD-10-CM | POA: Diagnosis not present

## 2017-09-09 DIAGNOSIS — E1142 Type 2 diabetes mellitus with diabetic polyneuropathy: Secondary | ICD-10-CM | POA: Diagnosis not present

## 2017-10-30 DIAGNOSIS — H401132 Primary open-angle glaucoma, bilateral, moderate stage: Secondary | ICD-10-CM | POA: Diagnosis not present

## 2017-11-07 DIAGNOSIS — E119 Type 2 diabetes mellitus without complications: Secondary | ICD-10-CM | POA: Diagnosis not present

## 2017-11-19 DIAGNOSIS — I10 Essential (primary) hypertension: Secondary | ICD-10-CM | POA: Diagnosis not present

## 2017-11-19 DIAGNOSIS — E1122 Type 2 diabetes mellitus with diabetic chronic kidney disease: Secondary | ICD-10-CM | POA: Diagnosis not present

## 2017-11-19 DIAGNOSIS — N183 Chronic kidney disease, stage 3 (moderate): Secondary | ICD-10-CM | POA: Diagnosis not present

## 2017-11-19 DIAGNOSIS — R8290 Unspecified abnormal findings in urine: Secondary | ICD-10-CM | POA: Diagnosis not present

## 2017-11-19 DIAGNOSIS — E039 Hypothyroidism, unspecified: Secondary | ICD-10-CM | POA: Diagnosis not present

## 2017-11-19 DIAGNOSIS — R339 Retention of urine, unspecified: Secondary | ICD-10-CM | POA: Diagnosis not present

## 2017-11-19 DIAGNOSIS — R350 Frequency of micturition: Secondary | ICD-10-CM | POA: Diagnosis not present

## 2017-11-26 DIAGNOSIS — I700 Atherosclerosis of aorta: Secondary | ICD-10-CM | POA: Diagnosis not present

## 2017-11-26 DIAGNOSIS — N183 Chronic kidney disease, stage 3 (moderate): Secondary | ICD-10-CM | POA: Diagnosis not present

## 2017-11-26 DIAGNOSIS — Z0000 Encounter for general adult medical examination without abnormal findings: Secondary | ICD-10-CM | POA: Diagnosis not present

## 2017-11-26 DIAGNOSIS — E1122 Type 2 diabetes mellitus with diabetic chronic kidney disease: Secondary | ICD-10-CM | POA: Diagnosis not present

## 2017-11-26 DIAGNOSIS — J4520 Mild intermittent asthma, uncomplicated: Secondary | ICD-10-CM | POA: Diagnosis not present

## 2017-11-26 DIAGNOSIS — I10 Essential (primary) hypertension: Secondary | ICD-10-CM | POA: Diagnosis not present

## 2017-11-26 DIAGNOSIS — E039 Hypothyroidism, unspecified: Secondary | ICD-10-CM | POA: Diagnosis not present

## 2017-11-26 DIAGNOSIS — E782 Mixed hyperlipidemia: Secondary | ICD-10-CM | POA: Diagnosis not present

## 2017-12-06 DIAGNOSIS — M3501 Sicca syndrome with keratoconjunctivitis: Secondary | ICD-10-CM | POA: Diagnosis not present

## 2017-12-10 DIAGNOSIS — E1142 Type 2 diabetes mellitus with diabetic polyneuropathy: Secondary | ICD-10-CM | POA: Diagnosis not present

## 2017-12-10 DIAGNOSIS — L851 Acquired keratosis [keratoderma] palmaris et plantaris: Secondary | ICD-10-CM | POA: Diagnosis not present

## 2017-12-10 DIAGNOSIS — B351 Tinea unguium: Secondary | ICD-10-CM | POA: Diagnosis not present

## 2017-12-20 DIAGNOSIS — M3501 Sicca syndrome with keratoconjunctivitis: Secondary | ICD-10-CM | POA: Diagnosis not present

## 2018-01-03 DIAGNOSIS — M3501 Sicca syndrome with keratoconjunctivitis: Secondary | ICD-10-CM | POA: Diagnosis not present

## 2018-01-21 DIAGNOSIS — S3992XA Unspecified injury of lower back, initial encounter: Secondary | ICD-10-CM | POA: Diagnosis not present

## 2018-01-21 DIAGNOSIS — S0992XA Unspecified injury of nose, initial encounter: Secondary | ICD-10-CM | POA: Diagnosis not present

## 2018-01-21 DIAGNOSIS — M533 Sacrococcygeal disorders, not elsewhere classified: Secondary | ICD-10-CM | POA: Diagnosis not present

## 2018-01-21 DIAGNOSIS — J3489 Other specified disorders of nose and nasal sinuses: Secondary | ICD-10-CM | POA: Diagnosis not present

## 2018-02-14 DIAGNOSIS — M3501 Sicca syndrome with keratoconjunctivitis: Secondary | ICD-10-CM | POA: Diagnosis not present

## 2018-02-28 DIAGNOSIS — I10 Essential (primary) hypertension: Secondary | ICD-10-CM | POA: Diagnosis not present

## 2018-03-14 DIAGNOSIS — M3501 Sicca syndrome with keratoconjunctivitis: Secondary | ICD-10-CM | POA: Diagnosis not present

## 2018-03-18 DIAGNOSIS — N183 Chronic kidney disease, stage 3 (moderate): Secondary | ICD-10-CM | POA: Diagnosis not present

## 2018-03-18 DIAGNOSIS — E039 Hypothyroidism, unspecified: Secondary | ICD-10-CM | POA: Diagnosis not present

## 2018-03-18 DIAGNOSIS — I10 Essential (primary) hypertension: Secondary | ICD-10-CM | POA: Diagnosis not present

## 2018-03-18 DIAGNOSIS — E1122 Type 2 diabetes mellitus with diabetic chronic kidney disease: Secondary | ICD-10-CM | POA: Diagnosis not present

## 2018-03-25 DIAGNOSIS — N183 Chronic kidney disease, stage 3 (moderate): Secondary | ICD-10-CM | POA: Diagnosis not present

## 2018-03-25 DIAGNOSIS — R05 Cough: Secondary | ICD-10-CM | POA: Diagnosis not present

## 2018-03-25 DIAGNOSIS — I10 Essential (primary) hypertension: Secondary | ICD-10-CM | POA: Diagnosis not present

## 2018-03-25 DIAGNOSIS — E782 Mixed hyperlipidemia: Secondary | ICD-10-CM | POA: Diagnosis not present

## 2018-03-25 DIAGNOSIS — I700 Atherosclerosis of aorta: Secondary | ICD-10-CM | POA: Diagnosis not present

## 2018-03-25 DIAGNOSIS — E039 Hypothyroidism, unspecified: Secondary | ICD-10-CM | POA: Diagnosis not present

## 2018-03-25 DIAGNOSIS — E1122 Type 2 diabetes mellitus with diabetic chronic kidney disease: Secondary | ICD-10-CM | POA: Diagnosis not present

## 2018-04-01 DIAGNOSIS — E1142 Type 2 diabetes mellitus with diabetic polyneuropathy: Secondary | ICD-10-CM | POA: Diagnosis not present

## 2018-04-01 DIAGNOSIS — B351 Tinea unguium: Secondary | ICD-10-CM | POA: Diagnosis not present

## 2018-04-23 DIAGNOSIS — L578 Other skin changes due to chronic exposure to nonionizing radiation: Secondary | ICD-10-CM | POA: Diagnosis not present

## 2018-04-23 DIAGNOSIS — L821 Other seborrheic keratosis: Secondary | ICD-10-CM | POA: Diagnosis not present

## 2018-04-23 DIAGNOSIS — L820 Inflamed seborrheic keratosis: Secondary | ICD-10-CM | POA: Diagnosis not present

## 2018-05-06 DIAGNOSIS — H401132 Primary open-angle glaucoma, bilateral, moderate stage: Secondary | ICD-10-CM | POA: Diagnosis not present

## 2018-05-06 DIAGNOSIS — H04123 Dry eye syndrome of bilateral lacrimal glands: Secondary | ICD-10-CM | POA: Diagnosis not present

## 2018-06-04 DIAGNOSIS — L820 Inflamed seborrheic keratosis: Secondary | ICD-10-CM | POA: Diagnosis not present

## 2018-06-04 DIAGNOSIS — L821 Other seborrheic keratosis: Secondary | ICD-10-CM | POA: Diagnosis not present

## 2018-07-02 DIAGNOSIS — E1142 Type 2 diabetes mellitus with diabetic polyneuropathy: Secondary | ICD-10-CM | POA: Diagnosis not present

## 2018-07-02 DIAGNOSIS — B351 Tinea unguium: Secondary | ICD-10-CM | POA: Diagnosis not present

## 2018-07-22 DIAGNOSIS — N183 Chronic kidney disease, stage 3 (moderate): Secondary | ICD-10-CM | POA: Diagnosis not present

## 2018-07-22 DIAGNOSIS — E782 Mixed hyperlipidemia: Secondary | ICD-10-CM | POA: Diagnosis not present

## 2018-07-22 DIAGNOSIS — I700 Atherosclerosis of aorta: Secondary | ICD-10-CM | POA: Diagnosis not present

## 2018-07-22 DIAGNOSIS — E1122 Type 2 diabetes mellitus with diabetic chronic kidney disease: Secondary | ICD-10-CM | POA: Diagnosis not present

## 2018-07-29 DIAGNOSIS — I700 Atherosclerosis of aorta: Secondary | ICD-10-CM | POA: Diagnosis not present

## 2018-07-29 DIAGNOSIS — Z23 Encounter for immunization: Secondary | ICD-10-CM | POA: Diagnosis not present

## 2018-07-29 DIAGNOSIS — J4520 Mild intermittent asthma, uncomplicated: Secondary | ICD-10-CM | POA: Diagnosis not present

## 2018-07-29 DIAGNOSIS — E782 Mixed hyperlipidemia: Secondary | ICD-10-CM | POA: Diagnosis not present

## 2018-07-29 DIAGNOSIS — I10 Essential (primary) hypertension: Secondary | ICD-10-CM | POA: Diagnosis not present

## 2018-07-29 DIAGNOSIS — D229 Melanocytic nevi, unspecified: Secondary | ICD-10-CM | POA: Diagnosis not present

## 2018-07-29 DIAGNOSIS — E039 Hypothyroidism, unspecified: Secondary | ICD-10-CM | POA: Diagnosis not present

## 2018-07-29 DIAGNOSIS — E1122 Type 2 diabetes mellitus with diabetic chronic kidney disease: Secondary | ICD-10-CM | POA: Diagnosis not present

## 2018-07-29 DIAGNOSIS — Z789 Other specified health status: Secondary | ICD-10-CM | POA: Diagnosis not present

## 2018-07-29 DIAGNOSIS — N183 Chronic kidney disease, stage 3 (moderate): Secondary | ICD-10-CM | POA: Diagnosis not present

## 2018-09-05 DIAGNOSIS — I10 Essential (primary) hypertension: Secondary | ICD-10-CM | POA: Diagnosis not present

## 2018-09-05 DIAGNOSIS — E782 Mixed hyperlipidemia: Secondary | ICD-10-CM | POA: Diagnosis not present

## 2018-09-05 DIAGNOSIS — R0602 Shortness of breath: Secondary | ICD-10-CM | POA: Diagnosis not present

## 2018-09-05 DIAGNOSIS — J4520 Mild intermittent asthma, uncomplicated: Secondary | ICD-10-CM | POA: Diagnosis not present

## 2018-09-29 DIAGNOSIS — C44729 Squamous cell carcinoma of skin of left lower limb, including hip: Secondary | ICD-10-CM | POA: Diagnosis not present

## 2018-09-29 DIAGNOSIS — C4492 Squamous cell carcinoma of skin, unspecified: Secondary | ICD-10-CM

## 2018-09-29 DIAGNOSIS — L821 Other seborrheic keratosis: Secondary | ICD-10-CM | POA: Diagnosis not present

## 2018-09-29 DIAGNOSIS — D485 Neoplasm of uncertain behavior of skin: Secondary | ICD-10-CM | POA: Diagnosis not present

## 2018-09-29 DIAGNOSIS — I8310 Varicose veins of unspecified lower extremity with inflammation: Secondary | ICD-10-CM | POA: Diagnosis not present

## 2018-09-29 HISTORY — DX: Squamous cell carcinoma of skin, unspecified: C44.92

## 2018-10-06 DIAGNOSIS — E1142 Type 2 diabetes mellitus with diabetic polyneuropathy: Secondary | ICD-10-CM | POA: Diagnosis not present

## 2018-10-06 DIAGNOSIS — L851 Acquired keratosis [keratoderma] palmaris et plantaris: Secondary | ICD-10-CM | POA: Diagnosis not present

## 2018-10-06 DIAGNOSIS — B351 Tinea unguium: Secondary | ICD-10-CM | POA: Diagnosis not present

## 2018-10-15 DIAGNOSIS — L578 Other skin changes due to chronic exposure to nonionizing radiation: Secondary | ICD-10-CM | POA: Diagnosis not present

## 2018-10-15 DIAGNOSIS — L821 Other seborrheic keratosis: Secondary | ICD-10-CM | POA: Diagnosis not present

## 2018-10-15 DIAGNOSIS — Z85828 Personal history of other malignant neoplasm of skin: Secondary | ICD-10-CM | POA: Diagnosis not present

## 2018-10-18 ENCOUNTER — Encounter: Payer: Self-pay | Admitting: Emergency Medicine

## 2018-10-18 ENCOUNTER — Inpatient Hospital Stay
Admission: EM | Admit: 2018-10-18 | Discharge: 2018-10-27 | DRG: 871 | Disposition: A | Discharge status: Home Health | Payer: PPO | Attending: Internal Medicine | Admitting: Internal Medicine

## 2018-10-18 ENCOUNTER — Other Ambulatory Visit: Payer: Self-pay

## 2018-10-18 ENCOUNTER — Emergency Department: Payer: PPO

## 2018-10-18 DIAGNOSIS — A419 Sepsis, unspecified organism: Secondary | ICD-10-CM | POA: Diagnosis not present

## 2018-10-18 DIAGNOSIS — Z7189 Other specified counseling: Secondary | ICD-10-CM | POA: Diagnosis not present

## 2018-10-18 DIAGNOSIS — J189 Pneumonia, unspecified organism: Secondary | ICD-10-CM | POA: Diagnosis not present

## 2018-10-18 DIAGNOSIS — N390 Urinary tract infection, site not specified: Secondary | ICD-10-CM | POA: Diagnosis not present

## 2018-10-18 DIAGNOSIS — J208 Acute bronchitis due to other specified organisms: Secondary | ICD-10-CM | POA: Diagnosis present

## 2018-10-18 DIAGNOSIS — Z85828 Personal history of other malignant neoplasm of skin: Secondary | ICD-10-CM

## 2018-10-18 DIAGNOSIS — J9601 Acute respiratory failure with hypoxia: Secondary | ICD-10-CM | POA: Diagnosis present

## 2018-10-18 DIAGNOSIS — Z7984 Long term (current) use of oral hypoglycemic drugs: Secondary | ICD-10-CM | POA: Diagnosis not present

## 2018-10-18 DIAGNOSIS — Z79890 Hormone replacement therapy (postmenopausal): Secondary | ICD-10-CM | POA: Diagnosis not present

## 2018-10-18 DIAGNOSIS — Z79899 Other long term (current) drug therapy: Secondary | ICD-10-CM | POA: Diagnosis not present

## 2018-10-18 DIAGNOSIS — B9781 Human metapneumovirus as the cause of diseases classified elsewhere: Secondary | ICD-10-CM | POA: Diagnosis not present

## 2018-10-18 DIAGNOSIS — E039 Hypothyroidism, unspecified: Secondary | ICD-10-CM | POA: Diagnosis not present

## 2018-10-18 DIAGNOSIS — E663 Overweight: Secondary | ICD-10-CM | POA: Diagnosis not present

## 2018-10-18 DIAGNOSIS — Z91040 Latex allergy status: Secondary | ICD-10-CM | POA: Diagnosis not present

## 2018-10-18 DIAGNOSIS — R531 Weakness: Secondary | ICD-10-CM | POA: Diagnosis not present

## 2018-10-18 DIAGNOSIS — E119 Type 2 diabetes mellitus without complications: Secondary | ICD-10-CM | POA: Diagnosis present

## 2018-10-18 DIAGNOSIS — R0902 Hypoxemia: Secondary | ICD-10-CM | POA: Diagnosis not present

## 2018-10-18 DIAGNOSIS — Z79891 Long term (current) use of opiate analgesic: Secondary | ICD-10-CM | POA: Diagnosis not present

## 2018-10-18 DIAGNOSIS — R7989 Other specified abnormal findings of blood chemistry: Secondary | ICD-10-CM | POA: Diagnosis not present

## 2018-10-18 DIAGNOSIS — R0602 Shortness of breath: Secondary | ICD-10-CM

## 2018-10-18 DIAGNOSIS — Z7982 Long term (current) use of aspirin: Secondary | ICD-10-CM

## 2018-10-18 DIAGNOSIS — J9801 Acute bronchospasm: Secondary | ICD-10-CM | POA: Diagnosis not present

## 2018-10-18 DIAGNOSIS — E860 Dehydration: Secondary | ICD-10-CM | POA: Diagnosis present

## 2018-10-18 DIAGNOSIS — I10 Essential (primary) hypertension: Secondary | ICD-10-CM | POA: Diagnosis present

## 2018-10-18 DIAGNOSIS — Z882 Allergy status to sulfonamides status: Secondary | ICD-10-CM | POA: Diagnosis not present

## 2018-10-18 DIAGNOSIS — Z6828 Body mass index (BMI) 28.0-28.9, adult: Secondary | ICD-10-CM

## 2018-10-18 DIAGNOSIS — J9811 Atelectasis: Secondary | ICD-10-CM | POA: Diagnosis not present

## 2018-10-18 DIAGNOSIS — J690 Pneumonitis due to inhalation of food and vomit: Secondary | ICD-10-CM | POA: Diagnosis present

## 2018-10-18 DIAGNOSIS — N179 Acute kidney failure, unspecified: Secondary | ICD-10-CM | POA: Diagnosis present

## 2018-10-18 DIAGNOSIS — E1165 Type 2 diabetes mellitus with hyperglycemia: Secondary | ICD-10-CM | POA: Diagnosis not present

## 2018-10-18 DIAGNOSIS — R05 Cough: Secondary | ICD-10-CM | POA: Diagnosis not present

## 2018-10-18 DIAGNOSIS — J449 Chronic obstructive pulmonary disease, unspecified: Secondary | ICD-10-CM | POA: Diagnosis not present

## 2018-10-18 DIAGNOSIS — J209 Acute bronchitis, unspecified: Secondary | ICD-10-CM | POA: Diagnosis not present

## 2018-10-18 DIAGNOSIS — Z515 Encounter for palliative care: Secondary | ICD-10-CM | POA: Diagnosis not present

## 2018-10-18 HISTORY — DX: Unspecified asthma, uncomplicated: J45.909

## 2018-10-18 HISTORY — DX: Essential (primary) hypertension: I10

## 2018-10-18 HISTORY — DX: Hypothyroidism, unspecified: E03.9

## 2018-10-18 LAB — URINALYSIS, COMPLETE (UACMP) WITH MICROSCOPIC
Bilirubin Urine: NEGATIVE
Glucose, UA: NEGATIVE mg/dL
HGB URINE DIPSTICK: NEGATIVE
Ketones, ur: NEGATIVE mg/dL
Nitrite: POSITIVE — AB
Protein, ur: NEGATIVE mg/dL
Specific Gravity, Urine: 1.01 (ref 1.005–1.030)
WBC, UA: >50 WBC/hpf — ABNORMAL HIGH (ref 0–5)
pH: 7 (ref 5.0–8.0)

## 2018-10-18 LAB — BASIC METABOLIC PANEL
ANION GAP: 10 (ref 5–15)
BUN: 26 mg/dL — ABNORMAL HIGH (ref 8–23)
CALCIUM: 8.6 mg/dL — AB (ref 8.9–10.3)
CO2: 33 mmol/L — ABNORMAL HIGH (ref 22–32)
Chloride: 96 mmol/L — ABNORMAL LOW (ref 98–111)
Creatinine, Ser: 1.4 mg/dL — ABNORMAL HIGH (ref 0.44–1.00)
GFR calc Af Amer: 38 mL/min — ABNORMAL LOW (ref >60)
GFR calc non Af Amer: 33 mL/min — ABNORMAL LOW (ref >60)
Glucose, Bld: 199 mg/dL — ABNORMAL HIGH (ref 70–99)
Potassium: 3.8 mmol/L (ref 3.5–5.1)
Sodium: 139 mmol/L (ref 135–145)

## 2018-10-18 LAB — CBC WITH DIFFERENTIAL/PLATELET
Abs Immature Granulocytes: 0.03 10*3/uL (ref 0.00–0.07)
Basophils Absolute: 0 10*3/uL (ref 0.0–0.1)
Basophils Relative: 0 %
Eosinophils Absolute: 0.2 10*3/uL (ref 0.0–0.5)
Eosinophils Relative: 3 %
HCT: 44.1 % (ref 36.0–46.0)
Hemoglobin: 13.6 g/dL (ref 12.0–15.0)
Immature Granulocytes: 0 %
Lymphocytes Relative: 13 %
Lymphs Abs: 1 10*3/uL (ref 0.7–4.0)
MCH: 30.6 pg (ref 26.0–34.0)
MCHC: 30.8 g/dL (ref 30.0–36.0)
MCV: 99.3 fL (ref 80.0–100.0)
MONOS PCT: 9 %
Monocytes Absolute: 0.6 10*3/uL (ref 0.1–1.0)
Neutro Abs: 5.3 10*3/uL (ref 1.7–7.7)
Neutrophils Relative %: 75 %
Platelets: 167 10*3/uL (ref 150–400)
RBC: 4.44 MIL/uL (ref 3.87–5.11)
RDW: 13.2 % (ref 11.5–15.5)
WBC: 7.1 10*3/uL (ref 4.0–10.5)
nRBC: 0 % (ref 0.0–0.2)

## 2018-10-18 LAB — LACTIC ACID, PLASMA
LACTIC ACID, VENOUS: 2 mmol/L — AB (ref 0.5–1.9)
Lactic Acid, Venous: 2.7 mmol/L (ref 0.5–1.9)

## 2018-10-18 LAB — INFLUENZA PANEL BY PCR (TYPE A & B)
INFLAPCR: NEGATIVE
Influenza B By PCR: NEGATIVE

## 2018-10-18 LAB — TROPONIN I: Troponin I: <0.03 ng/mL (ref <0.03)

## 2018-10-18 MED ORDER — LEVOTHYROXINE SODIUM 50 MCG PO TABS
50.0000 ug | ORAL_TABLET | Freq: Every day | ORAL | Status: DC
  Administered 2018-10-19 – 2018-10-27 (×9): 50 ug via ORAL
  Filled 2018-10-18 (×9): qty 1

## 2018-10-18 MED ORDER — SODIUM CHLORIDE 0.9 % IV SOLN
1.0000 g | INTRAVENOUS | Status: AC
  Administered 2018-10-20 – 2018-10-21 (×2): 1 g via INTRAVENOUS
  Filled 2018-10-18 (×2): qty 1
  Filled 2018-10-18: qty 10

## 2018-10-18 MED ORDER — SODIUM CHLORIDE 0.9 % IV BOLUS
500.0000 mL | Freq: Once | INTRAVENOUS | Status: AC
  Administered 2018-10-18: 500 mL via INTRAVENOUS

## 2018-10-18 MED ORDER — METOPROLOL TARTRATE 50 MG PO TABS
50.0000 mg | ORAL_TABLET | Freq: Two times a day (BID) | ORAL | Status: DC
  Administered 2018-10-18 – 2018-10-26 (×16): 50 mg via ORAL
  Filled 2018-10-18 (×16): qty 1

## 2018-10-18 MED ORDER — ACETAMINOPHEN 650 MG RE SUPP: 650.0000 mg | Freq: Four times a day (QID) | RECTAL | Status: DC | PRN

## 2018-10-18 MED ORDER — ASPIRIN EC 81 MG PO TBEC
81.0000 mg | DELAYED_RELEASE_TABLET | Freq: Every day | ORAL | Status: DC
  Administered 2018-10-19 – 2018-10-27 (×9): 81 mg via ORAL
  Filled 2018-10-18 (×9): qty 1

## 2018-10-18 MED ORDER — ENOXAPARIN SODIUM 30 MG/0.3ML ~~LOC~~ SOLN
30.0000 mg | SUBCUTANEOUS | Status: DC
  Administered 2018-10-18: 30 mg via SUBCUTANEOUS
  Filled 2018-10-18: qty 0.3

## 2018-10-18 MED ORDER — SODIUM CHLORIDE 0.9 % IV SOLN
INTRAVENOUS | Status: DC
  Administered 2018-10-18: 19:00:00 via INTRAVENOUS

## 2018-10-18 MED ORDER — ONDANSETRON HCL 4 MG/2ML IJ SOLN: 4.0000 mg | Freq: Four times a day (QID) | INTRAMUSCULAR | Status: DC | PRN

## 2018-10-18 MED ORDER — GLIMEPIRIDE 2 MG PO TABS
2.0000 mg | ORAL_TABLET | ORAL | Status: DC
  Administered 2018-10-19 – 2018-10-20 (×2): 2 mg via ORAL
  Filled 2018-10-18 (×3): qty 1

## 2018-10-18 MED ORDER — TRAMADOL HCL 50 MG PO TABS
50.0000 mg | ORAL_TABLET | Freq: Three times a day (TID) | ORAL | Status: DC | PRN
  Administered 2018-10-24 – 2018-10-25 (×2): 50 mg via ORAL
  Filled 2018-10-18 (×2): qty 1

## 2018-10-18 MED ORDER — SENNOSIDES-DOCUSATE SODIUM 8.6-50 MG PO TABS: 1.0000 | ORAL_TABLET | Freq: Every evening | ORAL | Status: DC | PRN

## 2018-10-18 MED ORDER — GABAPENTIN 100 MG PO CAPS
100.0000 mg | ORAL_CAPSULE | Freq: Two times a day (BID) | ORAL | Status: DC
  Administered 2018-10-18 – 2018-10-27 (×18): 100 mg via ORAL
  Filled 2018-10-18 (×18): qty 1

## 2018-10-18 MED ORDER — NORTRIPTYLINE HCL 25 MG PO CAPS
25.0000 mg | ORAL_CAPSULE | Freq: Two times a day (BID) | ORAL | Status: DC
  Administered 2018-10-18 – 2018-10-27 (×18): 25 mg via ORAL
  Filled 2018-10-18 (×19): qty 1

## 2018-10-18 MED ORDER — ONDANSETRON HCL 4 MG PO TABS: 4.0000 mg | ORAL_TABLET | Freq: Four times a day (QID) | ORAL | Status: DC | PRN

## 2018-10-18 MED ORDER — SODIUM CHLORIDE 0.9% FLUSH: 3.0000 mL | INTRAVENOUS | Status: DC | PRN

## 2018-10-18 MED ORDER — SODIUM CHLORIDE 0.9% FLUSH
3.0000 mL | Freq: Two times a day (BID) | INTRAVENOUS | Status: DC
  Administered 2018-10-19 – 2018-10-26 (×14): 3 mL via INTRAVENOUS

## 2018-10-18 MED ORDER — SODIUM CHLORIDE 0.9 % IV SOLN
1.0000 g | Freq: Once | INTRAVENOUS | Status: AC
  Administered 2018-10-18: 1 g via INTRAVENOUS
  Filled 2018-10-18: qty 10

## 2018-10-18 MED ORDER — ACETAMINOPHEN 325 MG PO TABS
650.0000 mg | ORAL_TABLET | Freq: Four times a day (QID) | ORAL | Status: DC | PRN
  Administered 2018-10-19 – 2018-10-26 (×2): 650 mg via ORAL
  Filled 2018-10-18 (×2): qty 2

## 2018-10-18 MED ORDER — SODIUM CHLORIDE 0.9 % IV SOLN
250.0000 mL | INTRAVENOUS | Status: DC | PRN
  Administered 2018-10-23 (×2): 250 mL via INTRAVENOUS

## 2018-10-18 NOTE — H&P (Addendum)
[facility] [place] NAME: Crystal Delacruz    MR#:  [id number]  DATE OF BIRTH:  October 08, 1924  DATE OF ADMISSION:  10/18/2018  PRIMARY CARE PHYSICIAN: Kirk Ruths, MD   REQUESTING/REFERRING PHYSICIAN:   CHIEF COMPLAINT:   Chief Complaint  Patient presents with  . Weakness  . Cough    HISTORY OF PRESENT ILLNESS: Crystal Delacruz  is a 84 y.o. female with a known history of diabetes mellitus, hypertension, hypothyroidism presented to the emergency room for generalized weakness.  Patient complains of dysuria and foul-smelling urine.  States she had some low-grade fever.  Appears dry and dehydrated.  Patient was evaluated in the emergency room her urinalysis was positive for infection and she was given IV Rocephin antibiotic.  Lactic acid was elevated.  Was started on IV fluids.  Oxygen saturation was low when she presented to the emergency room patient was put on oxygen via nasal cannula. chest x-ray did not reveal any pneumonia.  Patient uses a walker at home to ambulate.  No history of fall, head injury.  Hospitalist service was consulted for further care.  PAST MEDICAL HISTORY:   Past Medical History:  Diagnosis Date  . Diabetes mellitus without complication ([place])   . Hypertension   . Hypothyroidism     PAST SURGICAL HISTORY:  Past Surgical History:  Procedure Laterality Date  . none      SOCIAL HISTORY:  Social History   Tobacco Use  . Smoking status: Never Smoker  . Smokeless tobacco: Never Used  Substance Use Topics  . Alcohol use: No    FAMILY HISTORY: History reviewed. No pertinent family history.  DRUG ALLERGIES:  Allergies  Allergen Reactions  . Latex Swelling  . Sulfur     REVIEW OF SYSTEMS:   CONSTITUTIONAL: Low grade fever, has fatigue and weakness.  EYES: No blurred or double vision.  EARS, NOSE, AND THROAT: No tinnitus or ear pain.  RESPIRATORY: No cough, shortness of breath, wheezing or hemoptysis.   CARDIOVASCULAR: No chest pain, orthopnea, edema.  GASTROINTESTINAL: No nausea, vomiting, diarrhea or abdominal pain.  GENITOURINARY: Has dysuria,no hematuria.  ENDOCRINE: No polyuria, nocturia,  HEMATOLOGY: No anemia, easy bruising or bleeding SKIN: No rash or lesion. MUSCULOSKELETAL: No joint pain or arthritis.   NEUROLOGIC: No tingling, numbness, weakness.  PSYCHIATRY: No anxiety or depression.   MEDICATIONS AT HOME:  Prior to Admission medications   Medication Sig Start Date End Date Taking? Authorizing Provider  acetaminophen (TYLENOL) 500 MG tablet Take 500 mg by mouth every 6 (six) hours as needed.   Yes [provider]  aspirin EC 81 MG tablet Take 81 mg by mouth daily.   Yes [provider]  gabapentin (NEURONTIN) 100 MG capsule Take 200 mg by mouth 3 (three) times daily. 07/31/18  Yes [provider]  glimepiride (AMARYL) 2 MG tablet Take 2 mg by mouth every morning. 09/15/18  Yes [provider]  levothyroxine (SYNTHROID, LEVOTHROID) 50 MCG tablet Take 50 mcg by mouth daily. 08/08/18  Yes [provider]  metoprolol tartrate (LOPRESSOR) 50 MG tablet Take 50 mg by mouth 2 (two) times daily.   Yes [provider]  nortriptyline (PAMELOR) 25 MG capsule Take 25 mg by mouth 2 (two) times daily. 10/13/18  Yes [provider]  torsemide (DEMADEX) 20 MG tablet Take 40 mg by mouth daily. 09/05/18  Yes [provider]  traMADol (ULTRAM) 50 MG tablet Take 50 mg by mouth  every 6 (six) hours as needed. 05/23/18  Yes [provider]      PHYSICAL EXAMINATION:   VITAL SIGNS: Blood pressure 133/62, pulse 71, temperature 99.4 F (37.4 C), temperature source Oral, resp. rate (!) 25, height 5\' 6"  (1.676 m), weight 80.7 kg, SpO2 96 %.  GENERAL:  84 y.o.-year-old patient lying in the bed with no acute distress.  EYES: Pupils equal, round, reactive to light and accommodation. No scleral icterus. Extraocular muscles  intact.  HEENT: Head atraumatic, normocephalic. Oropharynx dry and nasopharynx clear.  NECK:  Supple, no jugular venous distention. No thyroid enlargement, no tenderness.  LUNGS: Normal breath sounds bilaterally, no wheezing, rales,rhonchi or crepitation. No use of accessory muscles of respiration.  CARDIOVASCULAR: S1, S2 normal. No murmurs, rubs, or gallops.  ABDOMEN: Soft, nontender, nondistended. Bowel sounds present. No organomegaly or mass.  EXTREMITIES: No pedal edema, cyanosis, or clubbing.  NEUROLOGIC: Cranial nerves II through XII are intact. Muscle strength 5/5 in all extremities. Sensation intact. Gait not checked.  PSYCHIATRIC: The patient is alert and oriented x 3.  SKIN: No obvious rash, lesion, or ulcer.   LABORATORY PANEL:   CBC Recent Labs  Lab 10/18/18 1346  WBC 7.1  HGB 13.6  HCT 44.1  PLT 167  MCV 99.3  MCH 30.6  MCHC 30.8  RDW 13.2  LYMPHSABS 1.0  MONOABS 0.6  EOSABS 0.2  BASOSABS 0.0   ------------------------------------------------------------------------------------------------------------------  Chemistries  Recent Labs  Lab 10/18/18 1346  NA 139  K 3.8  CL 96*  CO2 33*  GLUCOSE 199*  BUN 26*  CREATININE 1.40*  CALCIUM 8.6*   ------------------------------------------------------------------------------------------------------------------ estimated creatinine clearance is 27.5 mL/min (A) (by C-G formula based on SCr of 1.4 mg/dL (H)). ------------------------------------------------------------------------------------------------------------------ No results for input(s): TSH, T4TOTAL, T3FREE, THYROIDAB in the last 72 hours.  Invalid input(s): FREET3   Coagulation profile No results for input(s): INR, PROTIME in the last 168 hours. ------------------------------------------------------------------------------------------------------------------- No results for input(s): DDIMER in the last 72  hours. -------------------------------------------------------------------------------------------------------------------  Cardiac Enzymes Recent Labs  Lab 10/18/18 1346  TROPONINI <0.03   ------------------------------------------------------------------------------------------------------------------ Invalid input(s): POCBNP  ---------------------------------------------------------------------------------------------------------------  Urinalysis    Component Value Date/Time   COLORURINE YELLOW (A) 10/18/2018 1542   APPEARANCEUR TURBID (A) 10/18/2018 1542   APPEARANCEUR Turbid 09/10/2014 1250   LABSPEC 1.010 10/18/2018 1542   LABSPEC 1.013 09/10/2014 1250   PHURINE 7.0 10/18/2018 1542   GLUCOSEU NEGATIVE 10/18/2018 1542   GLUCOSEU Negative 09/10/2014 1250   HGBUR NEGATIVE 10/18/2018 [place] 10/18/2018 1542   BILIRUBINUR Negative 09/10/2014 1250   KETONESUR NEGATIVE 10/18/2018 1542   PROTEINUR NEGATIVE 10/18/2018 1542   NITRITE POSITIVE (A) 10/18/2018 1542   LEUKOCYTESUR LARGE (A) 10/18/2018 1542   LEUKOCYTESUR 3+ 09/10/2014 1250     RADIOLOGY: Dg Chest Portable 1 View  Result Date: 10/18/2018 CLINICAL DATA:  84 year old female with upper airway cough for the past few days EXAM: PORTABLE CHEST 1 VIEW COMPARISON:  Prior chest x-ray 01/07/2016 FINDINGS: Cardiac and mediastinal contours are within normal limits. Linear atelectasis versus scarring in the right upper lobe is similar compared to prior. No focal airspace consolidation, pulmonary edema, pleural effusion or pneumothorax. No acute osseous abnormality. IMPRESSION: Stable chest x-ray without evidence of acute cardiopulmonary process. Electronically Signed   By: Jacqulynn Cadet M.D.   On: 10/18/2018 13:17    EKG: Orders placed or performed during the hospital encounter of 10/18/18  . ED EKG  . ED EKG  . EKG 12-Lead  . EKG 12-Lead  IMPRESSION AND PLAN:  84 year old female patient with  history of diabetes mellitus type 2, hypertension, hypothyroidism presented to the emergency room for weakness  -Acute urinary tract infection Start patient on IV Rocephin antibiotic Follow-up urine culture  -Dehydration Hold diuretics IV fluids Monitor lactic acid  -Elevated lactic acid probably secondary to dehydration  -Hypothyroidism Resume levothyroxine  -Acute kidney injury secondary to dehydration IV fluids  -DVT prophylaxis with subcu Lovenox daily   All the records are reviewed and case discussed with ED provider. Management plans discussed with the patient, family and they are in agreement.  CODE STATUS:Full code    TOTAL TIME TAKING CARE OF THIS PATIENT: 53 minutes.    Saundra Shelling M.D on 10/18/2018 at 5:00 PM  Between 7am to 6pm - Pager - [phone]  After 6pm go to [email or web address] - password [email or web address] [facility]  [facility]  Office  [phone]  CC: Primary care physician; Kirk Ruths, MD

## 2018-10-18 NOTE — ED Notes (Signed)
Lab called with critical lactic result of 2.7, Anderson Malta RN aware

## 2018-10-18 NOTE — Progress Notes (Signed)
PHARMACIST - PHYSICIAN COMMUNICATION  CONCERNING:  Enoxaparin (Lovenox) for DVT Prophylaxis    RECOMMENDATION: Patient was prescribed enoxaprin 40mg  q24 hours for VTE prophylaxis.   Filed Weights   10/18/18 1225  Weight: 178 lb (80.7 kg)    Body mass index is 28.73 kg/m.  Estimated Creatinine Clearance: 27.5 mL/min (A) (by C-G formula based on SCr of 1.4 mg/dL (H)).   Patient is candidate for enoxaparin 30mg  every 24 hours based on CrCl <78ml/min   DESCRIPTION: Pharmacy has adjusted enoxaparin dose.   Patient is now receiving enoxaparin 30mg  every 24 hours.  Pernell Dupre, PharmD, BCPS Clinical Pharmacist 10/18/2018 6:36 PM

## 2018-10-18 NOTE — ED Provider Notes (Signed)
[facility] Emergency Department Provider Note ____________________________________________   First MD Initiated Contact with Patient 10/18/18 1229     (approximate)  I have reviewed the triage vital signs and the nursing notes.   HISTORY  Chief Complaint Weakness and Cough    HPI Crystal Delacruz is a 84 y.o. female with PMH as noted below who presents with generalized weakness over the last few days, worsening today, associated with cough and nasal congestion as well as with mild shortness of breath.  The patient denies fever, vomiting or diarrhea, chest pain, or urinary symptoms.  Past Medical History:  Diagnosis Date  . Diabetes mellitus without complication ([place])     There are no active problems to display for this patient.   History reviewed. No pertinent surgical history.  Prior to Admission medications   Medication Sig Start Date End Date Taking? Authorizing Provider  acetaminophen (TYLENOL) 500 MG tablet Take 500 mg by mouth every 6 (six) hours as needed.   Yes [provider]  aspirin EC 81 MG tablet Take 81 mg by mouth daily.   Yes [provider]  gabapentin (NEURONTIN) 100 MG capsule Take 200 mg by mouth 3 (three) times daily. 07/31/18  Yes [provider]  glimepiride (AMARYL) 2 MG tablet Take 2 mg by mouth every morning. 09/15/18  Yes [provider]  levothyroxine (SYNTHROID, LEVOTHROID) 50 MCG tablet Take 50 mcg by mouth daily. 08/08/18  Yes [provider]  metoprolol tartrate (LOPRESSOR) 50 MG tablet Take 50 mg by mouth 2 (two) times daily.   Yes [provider]  nortriptyline (PAMELOR) 25 MG capsule Take 25 mg by mouth 2 (two) times daily. 10/13/18  Yes [provider]  torsemide (DEMADEX) 20 MG tablet Take 40 mg by mouth daily. 09/05/18  Yes [provider]  traMADol (ULTRAM) 50 MG tablet Take 50 mg by mouth every 6 (six) hours as needed. 05/23/18  Yes [provider]    Allergies Latex and Sulfur  History reviewed. No pertinent family history.  Social History Social History   Tobacco Use  . Smoking status: Never Smoker  . Smokeless tobacco: Never Used  Substance Use Topics  . Alcohol use: No  . Drug use: Not on file    Review of Systems  Constitutional: No fever. Eyes: No redness. ENT:  Positive for nasal congestion. Cardiovascular: Denies chest pain. Respiratory: Positive for mild shortness of breath. Gastrointestinal: No vomiting or diarrhea.  Genitourinary: Negative for dysuria.  Musculoskeletal: Negative for back pain. Skin: Negative for rash. Neurological: Negative for headache.   ____________________________________________   PHYSICAL EXAM:  VITAL SIGNS: ED Triage Vitals  Enc Vitals Group     BP 10/18/18 1224 (!) 134/94     Pulse Rate 10/18/18 1224 67     Resp 10/18/18 1224 18     Temp 10/18/18 1224 99.4 F (37.4 C)     Temp Source 10/18/18 1224 Oral     SpO2 10/18/18 1224 90 %     Weight 10/18/18 1225 178 lb (80.7 kg)     Height 10/18/18 1225 5\' 6"  (1.676 m)     Head Circumference --      Peak Flow --      Pain Score 10/18/18 1224 0     Pain Loc --      Pain Edu? --      Excl. in [place]? --     Constitutional: Alert and oriented. Well appearing for age and  in no acute distress. Eyes: Conjunctivae are normal.  Head: Atraumatic. Nose: No congestion/rhinnorhea. Mouth/Throat: Mucous membranes are somewhat dry.   Neck: Normal range of motion.  Cardiovascular: Normal rate, regular rhythm. Grossly normal heart sounds.  Good peripheral circulation. Respiratory: Normal respiratory effort.  No retractions. Lungs CTAB. Gastrointestinal: Soft and nontender. No distention.  Genitourinary: No flank tenderness. Musculoskeletal: 1+ bilateral lower extremity edema.  Extremities warm and well perfused.  Neurologic:  Normal speech and language. No gross focal neurologic deficits are appreciated.  Skin:  Skin is  warm and dry. No rash noted. Psychiatric: Mood and affect are normal. Speech and behavior are normal.  ____________________________________________   LABS (all labs ordered are listed, but only abnormal results are displayed)  Labs Reviewed  BASIC METABOLIC PANEL - Abnormal; Notable for the following components:      Result Value   Chloride 96 (*)    CO2 33 (*)    Glucose, Bld 199 (*)    BUN 26 (*)    Creatinine, Ser 1.40 (*)    Calcium 8.6 (*)    GFR calc non Af Amer 33 (*)    GFR calc Af Amer 38 (*)    All other components within normal limits  LACTIC ACID, PLASMA - Abnormal; Notable for the following components:   Lactic Acid, Venous 2.7 (*)    All other components within normal limits  URINALYSIS, COMPLETE (UACMP) WITH MICROSCOPIC - Abnormal; Notable for the following components:   Color, Urine YELLOW (*)    APPearance TURBID (*)    Nitrite POSITIVE (*)    Leukocytes, UA LARGE (*)    WBC, UA >50 (*)    Bacteria, UA RARE (*)    All other components within normal limits  CBC WITH DIFFERENTIAL/PLATELET  TROPONIN I  INFLUENZA PANEL BY PCR (TYPE A & B)  LACTIC ACID, PLASMA   ____________________________________________  EKG  ED ECG REPORT I, Arta Silence, the attending physician, personally viewed and interpreted this ECG.  Date: 10/18/2018 EKG Time: 1223 Rate: 68 Rhythm: normal sinus rhythm QRS Axis: normal Intervals: normal ST/T Wave abnormalities: normal Narrative Interpretation: no evidence of acute ischemia  ____________________________________________  RADIOLOGY  CXR: No focal infiltrate  ____________________________________________   PROCEDURES  Procedure(s) performed: No  Procedures  Critical Care performed: No ____________________________________________   INITIAL IMPRESSION / ASSESSMENT AND PLAN / ED COURSE  Pertinent labs & imaging results that were available during my care of the patient were reviewed by me and considered in my  medical decision making (see chart for details).  84 year old female with a history of diabetes presents with increased generalized weakness as well as some nasal congestion and cough over the last several days, and unable to ambulate today.  On exam the patient is relatively comfortable appearing.  Her O2 saturation is borderline low, and dipped into the 80s multiple times while I was in the room.  Her other vital signs are normal.  The remainder of the exam is as described above.  Differential includes pneumonia, acute bronchitis, influenza, or less likely other source of infection.  I have a low suspicion for CHF or other cardiac etiology.  We will obtain labs, chest x-ray, give a small fluid bolus, and reassess.  ----------------------------------------- 4:36 PM on 10/18/2018 -----------------------------------------  Work-up reveals findings consistent with a UTI, as well as elevated lactate.  The vital signs remained stable.  Given her weakness and borderline hypoxia as well as the elevated lactate, the patient will require admission.  I  ordered IV ceftriaxone.  I signed the patient out to the hospitalist. ____________________________________________   FINAL CLINICAL IMPRESSION(S) / ED DIAGNOSES  Final diagnoses:  Urinary tract infection without hematuria, site unspecified  Hypoxia      NEW MEDICATIONS STARTED DURING THIS VISIT:  New Prescriptions   No medications on file     Note:  This document was prepared using Dragon voice recognition software and may include unintentional dictation errors.    Arta Silence, MD 10/18/18 [phone]

## 2018-10-18 NOTE — ED Triage Notes (Signed)
PT to ER via EMS from home.  Pt with increasing weakness over last several days.  Unable to walk today.  Pt also with cough.  States family members are also sick.

## 2018-10-18 NOTE — Progress Notes (Signed)
Advanced care plan.  Purpose of the Encounter: CODE STATUS  Parties in Attendance: Patient and family  Patient's Decision Capacity: Good  Subjective/Patient's story: Presented to the emergency room for generalized weakness   Objective/Medical story Has urinary tract infection and dehydration Needs IV fluids and IV antibiotics   Goals of care determination:  Advance care directives goals of care and treatment plan discussed with the patient Patient wants everything done to include CPR, intubation ventilator if the need arises   CODE STATUS: Full code   Time spent discussing advanced care planning: 16 minutes

## 2018-10-19 ENCOUNTER — Observation Stay: Payer: PPO

## 2018-10-19 DIAGNOSIS — N179 Acute kidney failure, unspecified: Secondary | ICD-10-CM | POA: Diagnosis not present

## 2018-10-19 DIAGNOSIS — N390 Urinary tract infection, site not specified: Secondary | ICD-10-CM | POA: Diagnosis not present

## 2018-10-19 DIAGNOSIS — E039 Hypothyroidism, unspecified: Secondary | ICD-10-CM | POA: Diagnosis not present

## 2018-10-19 DIAGNOSIS — J9811 Atelectasis: Secondary | ICD-10-CM | POA: Diagnosis not present

## 2018-10-19 DIAGNOSIS — I10 Essential (primary) hypertension: Secondary | ICD-10-CM | POA: Diagnosis not present

## 2018-10-19 LAB — BASIC METABOLIC PANEL
Anion gap: 8 (ref 5–15)
BUN: 21 mg/dL (ref 8–23)
CHLORIDE: 103 mmol/L (ref 98–111)
CO2: 29 mmol/L (ref 22–32)
Calcium: 7.9 mg/dL — ABNORMAL LOW (ref 8.9–10.3)
Creatinine, Ser: 1.16 mg/dL — ABNORMAL HIGH (ref 0.44–1.00)
GFR calc Af Amer: 47 mL/min — ABNORMAL LOW (ref >60)
GFR calc non Af Amer: 41 mL/min — ABNORMAL LOW (ref >60)
Glucose, Bld: 194 mg/dL — ABNORMAL HIGH (ref 70–99)
Potassium: 3.6 mmol/L (ref 3.5–5.1)
SODIUM: 140 mmol/L (ref 135–145)

## 2018-10-19 LAB — CBC
HCT: 37.5 % (ref 36.0–46.0)
Hemoglobin: 11.6 g/dL — ABNORMAL LOW (ref 12.0–15.0)
MCH: 30.9 pg (ref 26.0–34.0)
MCHC: 30.9 g/dL (ref 30.0–36.0)
MCV: 99.7 fL (ref 80.0–100.0)
Platelets: 154 10*3/uL (ref 150–400)
RBC: 3.76 MIL/uL — ABNORMAL LOW (ref 3.87–5.11)
RDW: 13.5 % (ref 11.5–15.5)
WBC: 6.6 10*3/uL (ref 4.0–10.5)
nRBC: 0 % (ref 0.0–0.2)

## 2018-10-19 LAB — GLUCOSE, CAPILLARY
Glucose-Capillary: 162 mg/dL — ABNORMAL HIGH (ref 70–99)
Glucose-Capillary: 153 mg/dL — ABNORMAL HIGH (ref 70–99)

## 2018-10-19 MED ORDER — ENOXAPARIN SODIUM 40 MG/0.4ML ~~LOC~~ SOLN
40.0000 mg | SUBCUTANEOUS | Status: DC
  Administered 2018-10-19 – 2018-10-26 (×8): 40 mg via SUBCUTANEOUS
  Filled 2018-10-19 (×8): qty 0.4

## 2018-10-19 MED ORDER — INSULIN ASPART 100 UNIT/ML ~~LOC~~ SOLN
0.0000 [IU] | Freq: Three times a day (TID) | SUBCUTANEOUS | Status: DC
  Administered 2018-10-19 – 2018-10-20 (×5): 2 [IU] via SUBCUTANEOUS
  Filled 2018-10-19 (×5): qty 1

## 2018-10-19 NOTE — Progress Notes (Signed)
PHARMACIST - PHYSICIAN COMMUNICATION  CONCERNING:  Enoxaparin (Lovenox) for DVT Prophylaxis    RECOMMENDATION: Patient was prescribed enoxaprin 30mg  q24 hours for VTE prophylaxis due to CrCl <43ml/min  Filed Weights   10/18/18 1225  Weight: 178 lb (80.7 kg)    Body mass index is 28.73 kg/m.  Estimated Creatinine Clearance: 33.2 mL/min (A) (by C-G formula based on SCr of 1.16 mg/dL (H)).   Patient is candidate for enoxaparin 40mg  every 24 hours now that CrCl >28ml/min.   DESCRIPTION: Pharmacy has adjusted enoxaparin dose.   Patient is now receiving enoxaparin 40mg  every 24 hours.  Pernell Dupre, PharmD, BCPS Clinical Pharmacist 10/19/2018 2:18 PM

## 2018-10-19 NOTE — Clinical Social Work Note (Signed)
CSW received consult for possible need for SNF placement. PT evaluation is pending. CSW will assess should PT recommend SNF.  Santiago Bumpers, MSW, Latanya Presser [phone]

## 2018-10-19 NOTE — Progress Notes (Signed)
[facility] [place] NAME: Crystal Delacruz    MR#:  [id number]  DATE OF BIRTH:  03/04/1924  SUBJECTIVE:   Patient presented to the ER due to generalized weakness and dysuria.  REVIEW OF SYSTEMS:    Review of Systems  Constitutional: Negative for fever, chills weight loss HENT: Negative for ear pain, nosebleeds, congestion, facial swelling, rhinorrhea, neck pain, neck stiffness and ear discharge.   Respiratory: Negative for cough, shortness of breath, wheezing  Cardiovascular: Negative for chest pain, palpitations and leg swelling.  Gastrointestinal: Negative for heartburn, abdominal pain, vomiting, diarrhea or consitpation Genitourinary: +=dysuria,no urgency, frequency, hematuria Musculoskeletal: Negative for back pain or joint pain Neurological: Negative for dizziness, seizures, syncope, focal weakness,  numbness and headaches.  Hematological: Does not bruise/bleed easily.  Psychiatric/Behavioral: Negative for hallucinations, confusion, dysphoric mood    Tolerating Diet:yes      DRUG ALLERGIES:   Allergies  Allergen Reactions  . Latex Swelling  . Sulfur     VITALS:  Blood pressure (!) 138/56, pulse 73, temperature 99.7 F (37.6 C), temperature source Oral, resp. rate 20, height 5\' 6"  (1.676 m), weight 80.7 kg, SpO2 (!) 89 %.  PHYSICAL EXAMINATION:  Constitutional: Appears well-developed and well-nourished. No distress. HENT: Normocephalic. Marland Kitchen Oropharynx is clear and moist.  Eyes: Conjunctivae and EOM are normal. PERRLA, no scleral icterus.  Neck: Normal ROM. Neck supple. No JVD. No tracheal deviation. CVS: RRR, S1/S2 +, no murmurs, no gallops, no carotid bruit.  Pulmonary: Effort and breath sounds normal, no stridor, rhonchi, wheezes, rales.  Abdominal: Soft. BS +,  no distension, tenderness, rebound or guarding.  Musculoskeletal: Normal range of motion. No edema and no tenderness.  Neuro: Alert. CN 2-12 grossly intact. No focal  deficits. Skin: Skin is warm and dry. No rash noted. Psychiatric: Normal mood and affect.      LABORATORY PANEL:   CBC Recent Labs  Lab 10/19/18 0507  WBC 6.6  HGB 11.6*  HCT 37.5  PLT 154   ------------------------------------------------------------------------------------------------------------------  Chemistries  Recent Labs  Lab 10/19/18 0507  NA 140  K 3.6  CL 103  CO2 29  GLUCOSE 194*  BUN 21  CREATININE 1.16*  CALCIUM 7.9*   ------------------------------------------------------------------------------------------------------------------  Cardiac Enzymes Recent Labs  Lab 10/18/18 1346  TROPONINI <0.03   ------------------------------------------------------------------------------------------------------------------  RADIOLOGY:  Dg Chest Portable 1 View  Result Date: 10/18/2018 CLINICAL DATA:  84 year old female with upper airway cough for the past few days EXAM: PORTABLE CHEST 1 VIEW COMPARISON:  Prior chest x-ray 01/07/2016 FINDINGS: Cardiac and mediastinal contours are within normal limits. Linear atelectasis versus scarring in the right upper lobe is similar compared to prior. No focal airspace consolidation, pulmonary edema, pleural effusion or pneumothorax. No acute osseous abnormality. IMPRESSION: Stable chest x-ray without evidence of acute cardiopulmonary process. Electronically Signed   By: Jacqulynn Cadet M.D.   On: 10/18/2018 13:17     ASSESSMENT AND PLAN:   84 year old female with history of diabetes and hypothyroidism who presented with generalized weakness and dysuria.   1.  Acute hypoxic respiratory failure with negative chest x-ray: Repeat chest x-ray to evaluate for underlying pneumonia Wean oxygen as tolerated  2.  Acute kidney injury in the setting of UTI and dehydration which is improved  3.  UTI: Continue Rocephin and follow-up on urine culture  4.  Essential hypertension: Continue metoprolol  5.  Hypothyroidism:  Continue Synthroid   Therapy is recommended skilled nursing facility upon discharge.  Clinical social  worker consult placed. Management plans discussed with the patient and she is in agreement.  CODE STATUS: full  TOTAL TIME TAKING CARE OF THIS PATIENT: 30 minutes.     POSSIBLE D/C tomorrow, DEPENDING ON CLINICAL CONDITION.   Rilea Arutyunyan M.D on 10/19/2018 at 10:44 AM  Between 7am to 6pm - Pager - [phone] After 6pm go to [email or web address] - password [email or web address] [place] [facility]  Office  [phone]  CC: Primary care physician; Kirk Ruths, MD  Note: This dictation was prepared with Dragon dictation along with smaller phrase technology. Any transcriptional errors that result from this process are unintentional.

## 2018-10-19 NOTE — Care Management Obs Status (Signed)
[facility] NOTIFICATION   Patient Details  Name: MARKESHA HANNIG MRN: [id number] Date of Birth: 09/13/1924   Medicare Observation Status Notification Given:  Yes    Jodeci Roarty A Kahlia Lagunes, RN 10/19/2018, 1:27 PM

## 2018-10-19 NOTE — Evaluation (Signed)
Physical Therapy Evaluation Patient Details Name: Crystal Delacruz MRN: [id number] DOB: [birth year] Today's Date: 10/19/2018   History of Present Illness  Patient is a 84 year old female admitted for UTI and hypoxia with c/o generalized weakness.  PMH includes DM, hypothyroid and Htn.  Clinical Impression  Pt is a 84 year old female who lives in a one story home with her husband.  She reports being able to transfer and walk short household distances at baseline but also reports having a lift at home.  Pt is emotionally labile and very fearful of falling, even when sitting near the middle of the bed, during evaluation.  She presented with generalized weakness of UE/LE and very limited ROM of R UE.  Pt required Mod hand held assist to get to EOB and attempted 3 STS, presenting with posterior lean and reporting LE's feeling very weak.  Pt very fearful of falling and quickly returned to sitting each time.  Pt did report some back pain at the end of session but that the onset was prior to admission.  Pt will continue to benefit from skilled PT with focus on strength, tolerance to activity and safe functional mobility.    Follow Up Recommendations SNF    Equipment Recommendations  None recommended by PT    Recommendations for Other Services       Precautions / Restrictions Precautions Precautions: Fall Precaution Comments: High fall risk Restrictions Weight Bearing Restrictions: No      Mobility  Bed Mobility Overal bed mobility: Needs Assistance Bed Mobility: Supine to Sit;Sit to Supine     Supine to sit: Mod assist Sit to supine: Min assist   General bed mobility comments: bilateral hand held assist to get to EOB with assistance scooting on chuck pad.  Pt able to lie back into the bed but required assistance with straightening and scooting up in bed.  Pt can bridge to scoot up in bed.  She exclaimed, "I can't do this!" during repositioning but was able to with  encouragement.  Transfers Overall transfer level: Needs assistance Equipment used: Rolling walker (2 wheeled) Transfers: Sit to/from Stand Sit to Stand: Max assist         General transfer comment: Pt attempted 3 STS and preferred to pull up on RW as opposed to pushing from bedside as advised.  Pt achieved partial STS and required assistance with upright posture each time, repeating fear of falling and presenting with posterior lean.  Ambulation/Gait Ambulation/Gait assistance: (Unsafe to attempt at this time.)              Stairs            Wheelchair Mobility    Modified Rankin (Stroke Patients Only)       Balance Overall balance assessment: Needs assistance Sitting-balance support: Bilateral upper extremity supported;Feet supported Sitting balance-Leahy Scale: Fair     Standing balance support: Bilateral upper extremity supported Standing balance-Leahy Scale: Poor                               Pertinent Vitals/Pain Pain Assessment: Faces Faces Pain Scale: Hurts little more Pain Location: Reported generalized back pain at very end of evaluation and stated that it was a result of being placed in a lift and being "drug down the steps".  PT assisted pt in positioning in bed to alleviate pain.    Home Living Family/patient expects to be discharged to:: Private residence  Living Arrangements: Spouse/significant other Available Help at Discharge: Family;Available 24 hours/day Type of Home: House Home Access: Stairs to enter   [employer] of Steps: 8 Home Layout: One level Home Equipment: Walker - 2 wheels Additional Comments: Patient states that she has a "lift" which assists her in standing from a chair or bedside.  Pt's hx is inconsistent at times.    Prior Function           Comments: First states that she can stand and walk to the restroom with a RW and then that she has a lift which usually assists her with mobility.      Hand Dominance        Extremity/Trunk Assessment   Upper Extremity Assessment Upper Extremity Assessment: Generalized weakness(R shoulder AROM limited to 30 degrees.)    Lower Extremity Assessment Lower Extremity Assessment: Generalized weakness(Grossly 4-/5 bilaterally. 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  )    Cervical / Trunk Assessment Cervical / Trunk Assessment: Kyphotic  Communication   Communication: No difficulties(Delay in response during conversation.)  Cognition Arousal/Alertness: Awake/alert Behavior During Therapy: Anxious;WFL for tasks assessed/performed Overall Cognitive Status: No family/caregiver present to determine baseline cognitive functioning                                 General Comments: Patient appears very emotionally labile and inconsistent with hx.  Does follow directions consistently.  Very fearful of falling.      General Comments      Exercises     Assessment/Plan    PT Assessment Patient needs continued PT services  PT Problem List Decreased strength;Decreased mobility;Decreased balance;Decreased knowledge of use of DME;Decreased activity tolerance       PT Treatment Interventions DME instruction;Functional mobility training;Balance training;Patient/family education;Therapeutic activities;Gait training;Stair training;Therapeutic exercise;Cognitive remediation    PT Goals (Current goals can be found in the Care Plan section)  Acute Rehab PT Goals Patient Stated Goal: To return home and walking short household distances. PT Goal Formulation: With patient Time For Goal Achievement: 11/09/18 Potential to Achieve Goals: Good    Frequency Min 2X/week   Barriers to discharge        Co-evaluation               AM-PAC PT "6 Clicks" Mobility  Outcome Measure Help needed turning from your back to your side while in a flat bed without using bedrails?: A Little Help needed moving from  lying on your back to sitting on the side of a flat bed without using bedrails?: A Lot Help needed moving to and from a bed to a chair (including a wheelchair)?: A Lot Help needed standing up from a chair using your arms (e.g., wheelchair or bedside chair)?: A Lot Help needed to walk in hospital room?: Total Help needed climbing 3-5 steps with a railing? : Total 6 Click Score: 11    End of Session Equipment Utilized During Treatment: Gait belt;Oxygen Activity Tolerance: Patient limited by fatigue(Limited by fear of falling and emotional state.) Patient left: in bed;with call bell/phone within reach;with bed alarm set Nurse Communication: Mobility status PT Visit Diagnosis: Unsteadiness on feet (R26.81);Muscle weakness (generalized) (M62.81)    Time: 8469-6295 PT Time Calculation (min) (ACUTE ONLY): 33 min   Charges:   PT Evaluation $PT Eval Moderate Complexity: 1 Mod PT Treatments $Therapeutic Activity: 8-22 mins        Roxanne Gates,  PT, DPT   Roxanne Gates 10/19/2018, 9:51 AM

## 2018-10-19 NOTE — Progress Notes (Signed)
PT Cancellation Note  Patient Details Name: DAISA STENNIS MRN: [id number] DOB: 1924-08-23   Cancelled Treatment:    Reason Eval/Treat Not Completed: Patient declined, no reason specified.  Order received.  Chart reviewed.  Pt requesting time to finish breakfast.  Will re-attempt shortly.  Roxanne Gates, PT, DPT  Roxanne Gates 10/19/2018, 8:49 AM

## 2018-10-20 DIAGNOSIS — Z6828 Body mass index (BMI) 28.0-28.9, adult: Secondary | ICD-10-CM | POA: Diagnosis not present

## 2018-10-20 DIAGNOSIS — E860 Dehydration: Secondary | ICD-10-CM | POA: Diagnosis present

## 2018-10-20 DIAGNOSIS — Z7984 Long term (current) use of oral hypoglycemic drugs: Secondary | ICD-10-CM | POA: Diagnosis not present

## 2018-10-20 DIAGNOSIS — N179 Acute kidney failure, unspecified: Secondary | ICD-10-CM | POA: Diagnosis present

## 2018-10-20 DIAGNOSIS — R0602 Shortness of breath: Secondary | ICD-10-CM | POA: Diagnosis not present

## 2018-10-20 DIAGNOSIS — A419 Sepsis, unspecified organism: Secondary | ICD-10-CM | POA: Diagnosis present

## 2018-10-20 DIAGNOSIS — Z882 Allergy status to sulfonamides status: Secondary | ICD-10-CM | POA: Diagnosis not present

## 2018-10-20 DIAGNOSIS — Z79899 Other long term (current) drug therapy: Secondary | ICD-10-CM | POA: Diagnosis not present

## 2018-10-20 DIAGNOSIS — J208 Acute bronchitis due to other specified organisms: Secondary | ICD-10-CM | POA: Diagnosis present

## 2018-10-20 DIAGNOSIS — Z91040 Latex allergy status: Secondary | ICD-10-CM | POA: Diagnosis not present

## 2018-10-20 DIAGNOSIS — N390 Urinary tract infection, site not specified: Secondary | ICD-10-CM | POA: Diagnosis present

## 2018-10-20 DIAGNOSIS — E039 Hypothyroidism, unspecified: Secondary | ICD-10-CM | POA: Diagnosis present

## 2018-10-20 DIAGNOSIS — I10 Essential (primary) hypertension: Secondary | ICD-10-CM | POA: Diagnosis present

## 2018-10-20 DIAGNOSIS — Z85828 Personal history of other malignant neoplasm of skin: Secondary | ICD-10-CM | POA: Diagnosis not present

## 2018-10-20 DIAGNOSIS — E663 Overweight: Secondary | ICD-10-CM | POA: Diagnosis present

## 2018-10-20 DIAGNOSIS — Z79891 Long term (current) use of opiate analgesic: Secondary | ICD-10-CM | POA: Diagnosis not present

## 2018-10-20 DIAGNOSIS — R531 Weakness: Secondary | ICD-10-CM | POA: Diagnosis present

## 2018-10-20 DIAGNOSIS — Z7189 Other specified counseling: Secondary | ICD-10-CM | POA: Diagnosis not present

## 2018-10-20 DIAGNOSIS — J9601 Acute respiratory failure with hypoxia: Secondary | ICD-10-CM | POA: Diagnosis present

## 2018-10-20 DIAGNOSIS — J9801 Acute bronchospasm: Secondary | ICD-10-CM | POA: Diagnosis not present

## 2018-10-20 DIAGNOSIS — R0902 Hypoxemia: Secondary | ICD-10-CM | POA: Diagnosis not present

## 2018-10-20 DIAGNOSIS — Z7982 Long term (current) use of aspirin: Secondary | ICD-10-CM | POA: Diagnosis not present

## 2018-10-20 DIAGNOSIS — J690 Pneumonitis due to inhalation of food and vomit: Secondary | ICD-10-CM | POA: Diagnosis present

## 2018-10-20 DIAGNOSIS — J189 Pneumonia, unspecified organism: Secondary | ICD-10-CM | POA: Diagnosis not present

## 2018-10-20 DIAGNOSIS — B9781 Human metapneumovirus as the cause of diseases classified elsewhere: Secondary | ICD-10-CM | POA: Diagnosis present

## 2018-10-20 DIAGNOSIS — E119 Type 2 diabetes mellitus without complications: Secondary | ICD-10-CM | POA: Diagnosis present

## 2018-10-20 DIAGNOSIS — Z79890 Hormone replacement therapy (postmenopausal): Secondary | ICD-10-CM | POA: Diagnosis not present

## 2018-10-20 DIAGNOSIS — J209 Acute bronchitis, unspecified: Secondary | ICD-10-CM | POA: Diagnosis not present

## 2018-10-20 LAB — GLUCOSE, CAPILLARY
GLUCOSE-CAPILLARY: 164 mg/dL — AB (ref 70–99)
Glucose-Capillary: 85 mg/dL (ref 70–99)
Glucose-Capillary: 141 mg/dL — ABNORMAL HIGH (ref 70–99)
Glucose-Capillary: 152 mg/dL — ABNORMAL HIGH (ref 70–99)
Glucose-Capillary: 167 mg/dL — ABNORMAL HIGH (ref 70–99)
Glucose-Capillary: 229 mg/dL — ABNORMAL HIGH (ref 70–99)

## 2018-10-20 MED ORDER — METHYLPREDNISOLONE SODIUM SUCC 40 MG IJ SOLR
40.0000 mg | Freq: Two times a day (BID) | INTRAMUSCULAR | Status: AC
  Administered 2018-10-20 – 2018-10-22 (×4): 40 mg via INTRAVENOUS
  Filled 2018-10-20 (×4): qty 1

## 2018-10-20 MED ORDER — ALBUTEROL SULFATE (2.5 MG/3ML) 0.083% IN NEBU
2.5000 mg | INHALATION_SOLUTION | RESPIRATORY_TRACT | Status: DC | PRN
  Administered 2018-10-23: 2.5 mg via RESPIRATORY_TRACT
  Filled 2018-10-20: qty 3

## 2018-10-20 MED ORDER — IPRATROPIUM-ALBUTEROL 0.5-2.5 (3) MG/3ML IN SOLN
3.0000 mL | Freq: Three times a day (TID) | RESPIRATORY_TRACT | Status: DC
  Administered 2018-10-20 – 2018-10-23 (×9): 3 mL via RESPIRATORY_TRACT
  Filled 2018-10-20 (×10): qty 3

## 2018-10-20 MED ORDER — INSULIN ASPART 100 UNIT/ML ~~LOC~~ SOLN
0.0000 [IU] | Freq: Three times a day (TID) | SUBCUTANEOUS | Status: DC
  Administered 2018-10-20 – 2018-10-21 (×3): 3 [IU] via SUBCUTANEOUS
  Filled 2018-10-20 (×3): qty 1

## 2018-10-20 MED ORDER — SODIUM CHLORIDE 0.9 % IV SOLN
500.0000 mg | INTRAVENOUS | Status: DC
  Administered 2018-10-20: 500 mg via INTRAVENOUS
  Filled 2018-10-20 (×2): qty 500

## 2018-10-20 NOTE — Progress Notes (Signed)
Patient complained of pain to IV site post IV ABT. IV team consult put in for new IV to be placed. 1700 ABT pushed back to 2000 as IV team has not come and placed new IV.

## 2018-10-20 NOTE — Progress Notes (Signed)
[place] at [place] NAME: Crystal Delacruz    MR#:  [id number]  DATE OF BIRTH:  [birth year]  SUBJECTIVE:   Patient is reporting sob and cough, feeling weak  REVIEW OF SYSTEMS:    Review of Systems  Constitutional: Negative for fever, chills weight loss HENT: Negative for ear pain, nosebleeds, congestion, facial swelling, rhinorrhea, neck pain, neck stiffness and ear discharge.   Respiratory: Reporting chest tightness and shortness of breath, denies wheezing  Cardiovascular: Negative for chest pain, palpitations and leg swelling.  Gastrointestinal: Negative for heartburn, abdominal pain, vomiting, diarrhea or consitpation Genitourinary: +=dysuria,no urgency, frequency, hematuria Musculoskeletal: Negative for back pain or joint pain Neurological: Negative for dizziness, seizures, syncope, focal weakness,  numbness and headaches.  Hematological: Does not bruise/bleed easily.  Psychiatric/Behavioral: Negative for hallucinations, confusion, dysphoric mood    Tolerating Diet:yes      DRUG ALLERGIES:   Allergies  Allergen Reactions  . Latex Swelling  . Sulfur     VITALS:  Blood pressure 131/67, pulse 74, temperature 98.1 F (36.7 C), temperature source Oral, resp. rate 20, height 5\' 6"  (1.676 m), weight 80.7 kg, SpO2 96 %.  PHYSICAL EXAMINATION:  Constitutional: Appears well-developed and well-nourished. No distress. HENT: Normocephalic. Marland Kitchen Oropharynx is clear and moist.  Eyes: Conjunctivae and EOM are normal. PERRLA, no scleral icterus.  Neck: Normal ROM. Neck supple. No JVD. No tracheal deviation. CVS: RRR, S1/S2 +, no murmurs, no gallops, no carotid bruit.  Pulmonary: Moderate coarse bronchial breath sounds, no stridor, rhonchi, wheezes, rales.  Abdominal: Soft. BS +,  no distension, tenderness, rebound or guarding.  Musculoskeletal: Normal range of motion. No edema and no tenderness.  Neuro: Alert.  Awake, alert and oriented x3. No  focal deficits. Skin: Skin is warm and dry. No rash noted. Psychiatric: Normal mood and affect.      LABORATORY PANEL:   CBC Recent Labs  Lab 10/19/18 0507  WBC 6.6  HGB 11.6*  HCT 37.5  PLT 154   ------------------------------------------------------------------------------------------------------------------  Chemistries  Recent Labs  Lab 10/19/18 0507  NA 140  K 3.6  CL 103  CO2 29  GLUCOSE 194*  BUN 21  CREATININE 1.16*  CALCIUM 7.9*   ------------------------------------------------------------------------------------------------------------------  Cardiac Enzymes Recent Labs  Lab 10/18/18 1346  TROPONINI <0.03   ------------------------------------------------------------------------------------------------------------------  RADIOLOGY:  Dg Chest 1 View  Result Date: 10/19/2018 CLINICAL DATA:  Continued upper airway cough since admission yesterday, generalized weakness, nasal congestion, history diabetes mellitus, hypertension EXAM: CHEST  1 VIEW COMPARISON:  Portable exam 1054 hours compared to 10/18/2018 FINDINGS: Slightly rotated to the RIGHT. Upper normal heart size. Stable mediastinal contours. Minimal subsegmental atelectasis mid to lower RIGHT lung. Probable LEFT infrahilar infiltrate with mild associated peribronchial thickening. No pleural effusion or pneumothorax. Bones demineralized. IMPRESSION: Mild RIGHT basilar atelectasis. Bronchitic changes with suspected developing LEFT lower lobe infiltrate. Electronically Signed   By: Lavonia Dana M.D.   On: 10/19/2018 11:20     ASSESSMENT AND PLAN:   84 year old female with history of diabetes and hypothyroidism who presented with generalized weakness and dysuria.   1.  Acute hypoxic respiratory failure -acute bronchitis with possible developing left lower lobe pneumonia  Started patient on steroids and Rocephin and azithromycin Repeat chest x-ray Wean oxygen as tolerated  2.  Acute kidney injury  in the setting of UTI and dehydration which is improved  3.  UTI: Continue Rocephin and follow-up on urine culture  4.  Essential hypertension: Continue metoprolol  5.  Hypothyroidism: Continue Synthroid  Generalized weakness refusing skilled nursing facility and requesting home health physical therapy   Therapy is recommended skilled nursing facility upon discharge.  Clinical social worker consult placed. Management plans discussed with the patient and patient's daughter-in-law Allizon Woznick she is in agreement.  CODE STATUS: full  TOTAL TIME TAKING CARE OF THIS PATIENT: 33 minutes.     POSSIBLE D/C tomorrow, DEPENDING ON CLINICAL CONDITION.   Nicholes Mango M.D on 10/20/2018 at 10:10 PM  Between 7am to 6pm - Pager - [phone] After 6pm go to [email or web address] - password [email or web address] [facility]  Office  [phone]  CC: Primary care physician; Kirk Ruths, MD  Note: This dictation was prepared with Dragon dictation along with smaller phrase technology. Any transcriptional errors that result from this process are unintentional.

## 2018-10-21 ENCOUNTER — Inpatient Hospital Stay: Payer: PPO

## 2018-10-21 LAB — BASIC METABOLIC PANEL
Anion gap: 8 (ref 5–15)
BUN: 20 mg/dL (ref 8–23)
CO2: 29 mmol/L (ref 22–32)
CREATININE: 0.87 mg/dL (ref 0.44–1.00)
Calcium: 8.4 mg/dL — ABNORMAL LOW (ref 8.9–10.3)
Chloride: 101 mmol/L (ref 98–111)
GFR calc Af Amer: >60 mL/min (ref >60)
GFR calc non Af Amer: 58 mL/min — ABNORMAL LOW (ref >60)
Glucose, Bld: 208 mg/dL — ABNORMAL HIGH (ref 70–99)
Potassium: 4.1 mmol/L (ref 3.5–5.1)
SODIUM: 138 mmol/L (ref 135–145)

## 2018-10-21 LAB — GLUCOSE, CAPILLARY
Glucose-Capillary: 214 mg/dL — ABNORMAL HIGH (ref 70–99)
Glucose-Capillary: 210 mg/dL — ABNORMAL HIGH (ref 70–99)
Glucose-Capillary: 218 mg/dL — ABNORMAL HIGH (ref 70–99)
Glucose-Capillary: 214 mg/dL — ABNORMAL HIGH (ref 70–99)
Glucose-Capillary: 215 mg/dL — ABNORMAL HIGH (ref 70–99)

## 2018-10-21 LAB — RESPIRATORY PANEL BY PCR
Adenovirus: NOT DETECTED
BORDETELLA PERTUSSIS-RVPCR: NOT DETECTED
Chlamydophila pneumoniae: NOT DETECTED
Coronavirus 229E: NOT DETECTED
Coronavirus HKU1: NOT DETECTED
Coronavirus NL63: NOT DETECTED
Coronavirus OC43: NOT DETECTED
Influenza A: NOT DETECTED
Influenza B: NOT DETECTED
Metapneumovirus: DETECTED — AB
Mycoplasma pneumoniae: NOT DETECTED
Parainfluenza Virus 1: NOT DETECTED
Parainfluenza Virus 2: NOT DETECTED
Parainfluenza Virus 3: NOT DETECTED
Parainfluenza Virus 4: NOT DETECTED
Respiratory Syncytial Virus: NOT DETECTED
Rhinovirus / Enterovirus: NOT DETECTED

## 2018-10-21 MED ORDER — PREDNISONE 50 MG PO TABS
50.0000 mg | ORAL_TABLET | Freq: Every day | ORAL | Status: DC
  Administered 2018-10-22: 50 mg via ORAL
  Filled 2018-10-21: qty 1

## 2018-10-21 MED ORDER — AZITHROMYCIN 250 MG PO TABS
500.0000 mg | ORAL_TABLET | Freq: Every day | ORAL | Status: AC
  Administered 2018-10-21 – 2018-10-22 (×2): 500 mg via ORAL
  Filled 2018-10-21 (×2): qty 2

## 2018-10-21 MED ORDER — POLYETHYLENE GLYCOL 3350 17 G PO PACK
17.0000 g | PACK | Freq: Every day | ORAL | Status: DC
  Administered 2018-10-22 – 2018-10-27 (×5): 17 g via ORAL
  Filled 2018-10-21 (×6): qty 1

## 2018-10-21 MED ORDER — BISACODYL 5 MG PO TBEC: 10.0000 mg | DELAYED_RELEASE_TABLET | Freq: Every day | ORAL | Status: DC | PRN

## 2018-10-21 MED ORDER — SENNOSIDES-DOCUSATE SODIUM 8.6-50 MG PO TABS
1.0000 | ORAL_TABLET | Freq: Every day | ORAL | Status: DC
  Administered 2018-10-21 – 2018-10-24 (×4): 1 via ORAL
  Filled 2018-10-21 (×5): qty 1

## 2018-10-21 MED ORDER — INSULIN ASPART 100 UNIT/ML ~~LOC~~ SOLN
0.0000 [IU] | Freq: Every day | SUBCUTANEOUS | Status: DC
  Administered 2018-10-21 – 2018-10-25 (×2): 2 [IU] via SUBCUTANEOUS
  Administered 2018-10-26: 3 [IU] via SUBCUTANEOUS
  Filled 2018-10-21 (×3): qty 1

## 2018-10-21 MED ORDER — CEFDINIR 300 MG PO CAPS
300.0000 mg | ORAL_CAPSULE | Freq: Two times a day (BID) | ORAL | Status: DC
  Administered 2018-10-22 – 2018-10-23 (×3): 300 mg via ORAL
  Filled 2018-10-21 (×4): qty 1

## 2018-10-21 MED ORDER — INSULIN ASPART 100 UNIT/ML ~~LOC~~ SOLN
0.0000 [IU] | Freq: Three times a day (TID) | SUBCUTANEOUS | Status: DC
  Administered 2018-10-21: 5 [IU] via SUBCUTANEOUS
  Administered 2018-10-22 (×2): 8 [IU] via SUBCUTANEOUS
  Administered 2018-10-22: 3 [IU] via SUBCUTANEOUS
  Administered 2018-10-23: 2 [IU] via SUBCUTANEOUS
  Administered 2018-10-23: 3 [IU] via SUBCUTANEOUS
  Administered 2018-10-23: 5 [IU] via SUBCUTANEOUS
  Administered 2018-10-24: 2 [IU] via SUBCUTANEOUS
  Administered 2018-10-24: 5 [IU] via SUBCUTANEOUS
  Administered 2018-10-24 – 2018-10-25 (×2): 3 [IU] via SUBCUTANEOUS
  Administered 2018-10-25: 8 [IU] via SUBCUTANEOUS
  Administered 2018-10-25: 2 [IU] via SUBCUTANEOUS
  Administered 2018-10-26: 8 [IU] via SUBCUTANEOUS
  Administered 2018-10-26: 3 [IU] via SUBCUTANEOUS
  Administered 2018-10-26: 2 [IU] via SUBCUTANEOUS
  Administered 2018-10-27: 3 [IU] via SUBCUTANEOUS
  Filled 2018-10-21 (×18): qty 1

## 2018-10-21 MED ORDER — FLEET ENEMA 7-19 GM/118ML RE ENEM: 1.0000 | ENEMA | RECTAL | Status: DC | PRN

## 2018-10-21 NOTE — Progress Notes (Signed)
Physical Therapy Treatment Patient Details Name: Crystal Delacruz MRN: [id number] DOB: 1924-02-11 Today's Date: 10/21/2018    History of Present Illness Patient is a 84 year old female admitted for UTI and hypoxia with c/o generalized weakness.  PMH includes DM, hypothyroid and Htn.    PT Comments    Pt appearing anxious about getting OOB and not doing well with mobility for therapist (pt reports she wants to go home and does not want to be told she couldn't go home because she couldn't stand). Eventually able to encourage pt to participate (pt requesting 2nd assist for safety).  Pt SBA semi-supine to sit; min assist to stand with RW from bed (CGA of 2nd for safety per pt request); and CGA x2 (2nd assist CGA for safety per pt request) to ambulate a few feet bed to recliner with RW.  Pt declined to ambulate any further d/t concerns of weakness and falling.  O2 sats 88-89% on 2 L O2 via nasal cannula after transfer to chair but improved to 92% with sitting rest break on 2 L O2 via nasal cannula: pt and pt's family member report pt is not on home O2 (CM notified).  Pt requesting to go home.  PT recommending STR but if pt does discharge home, recommend having 24/7 assist for functional mobility w/c level (pt reports having w/c, walker, and BSC at home already) and HHPT to improve strength and progress pt with ambulation back to prior level of function.    Follow Up Recommendations  SNF     Equipment Recommendations  Rolling walker with 5" wheels    Recommendations for Other Services OT consult     Precautions / Restrictions Precautions Precautions: Fall Precaution Comments: High fall risk Restrictions Weight Bearing Restrictions: No    Mobility  Bed Mobility Overal bed mobility: Needs Assistance Bed Mobility: Supine to Sit     Supine to sit: HOB elevated;Supervision     General bed mobility comments: mild increased effort to perform on own  Transfers Overall transfer level: Needs  assistance Equipment used: Rolling walker (2 wheeled) Transfers: Sit to/from Stand Sit to Stand: Min assist;Min guard;+2 safety/equipment         General transfer comment: min assist to stand from bed (2nd assist CGA for safety per pt request)  Ambulation/Gait Ambulation/Gait assistance: Min guard;+2 safety/equipment Gait Distance (Feet): 3 Feet(bed to recliner) Assistive device: Rolling walker (2 wheeled)   Gait velocity: decreased   General Gait Details: decreased B step length/foot clearance/heelstrike; increased time to perform   Stairs             Wheelchair Mobility    Modified Rankin (Stroke Patients Only)       Balance Overall balance assessment: Needs assistance Sitting-balance support: No upper extremity supported;Feet supported Sitting balance-Leahy Scale: Good Sitting balance - Comments: steady sitting reaching within BOS   Standing balance support: Bilateral upper extremity supported Standing balance-Leahy Scale: Good Standing balance comment: steady taking steps with UE support on RW bed to recliner                            Cognition Arousal/Alertness: Awake/alert Behavior During Therapy: Anxious                                   General Comments: Initially appearing a little confused but appearing to improve as session continued  Exercises      General Comments   Nursing cleared pt for participation in physical therapy.  Pt agreeable to PT session.      Pertinent Vitals/Pain Pain Assessment: Faces Faces Pain Scale: Hurts a little bit Pain Location: low back pain Pain Descriptors / Indicators: Aching;Sore Pain Intervention(s): Limited activity within patient's tolerance;Monitored during session;Repositioned  HR WFL during session.    Home Living                      Prior Function            PT Goals (current goals can now be found in the care plan section) Acute Rehab PT Goals Patient  Stated Goal: To return home and walking short household distances. PT Goal Formulation: With patient Time For Goal Achievement: 11/09/18 Potential to Achieve Goals: Good Progress towards PT goals: Progressing toward goals    Frequency    Min 2X/week      PT Plan Current plan remains appropriate    Co-evaluation              AM-PAC PT "6 Clicks" Mobility   Outcome Measure  Help needed turning from your back to your side while in a flat bed without using bedrails?: A Little Help needed moving from lying on your back to sitting on the side of a flat bed without using bedrails?: A Little Help needed moving to and from a bed to a chair (including a wheelchair)?: A Little Help needed standing up from a chair using your arms (e.g., wheelchair or bedside chair)?: A Little Help needed to walk in hospital room?: A Lot Help needed climbing 3-5 steps with a railing? : Total 6 Click Score: 15    End of Session Equipment Utilized During Treatment: Gait belt;Oxygen Activity Tolerance: Patient limited by fatigue Patient left: in chair;with call bell/phone within reach;with chair alarm set;with family/visitor present Nurse Communication: Mobility status;Precautions PT Visit Diagnosis: Unsteadiness on feet (R26.81);Other abnormalities of gait and mobility (R26.89);Muscle weakness (generalized) (M62.81)     Time: 2426-8341 PT Time Calculation (min) (ACUTE ONLY): 50 min  Charges:  $Therapeutic Exercise: 8-22 mins $Therapeutic Activity: 23-37 mins                    Leitha Bleak, PT 10/21/18, 10:49 AM [phone]

## 2018-10-21 NOTE — Progress Notes (Signed)
[place] at [place] NAME: Crystal Delacruz    MR#:  [id number]  DATE OF BIRTH:  18-Jan-1924  SUBJECTIVE:   Patient is still reporting sob with exertion and cough, feeling weak She is less ambulatory at her baseline .  Husband and daughter-in-law at bedside  REVIEW OF SYSTEMS:    Review of Systems  Constitutional: Negative for fever, chills weight loss HENT: Negative for ear pain, nosebleeds, congestion, facial swelling, rhinorrhea, neck pain, neck stiffness and ear discharge.   Respiratory: Reporting chest tightness and shortness of breath, denies wheezing  Cardiovascular: Negative for chest pain, palpitations and leg swelling.  Gastrointestinal: Negative for heartburn, abdominal pain, vomiting, diarrhea or consitpation Genitourinary: +=dysuria,no urgency, frequency, hematuria Musculoskeletal: Negative for back pain or joint pain Neurological: Negative for dizziness, seizures, syncope, focal weakness,  numbness and headaches.  Hematological: Does not bruise/bleed easily.  Psychiatric/Behavioral: Negative for hallucinations, confusion, dysphoric mood    Tolerating Diet:yes      DRUG ALLERGIES:   Allergies  Allergen Reactions  . Latex Swelling  . Sulfur     VITALS:  Blood pressure 116/74, pulse 63, temperature 98.1 F (36.7 C), temperature source Oral, resp. rate 17, height 5\' 6"  (1.676 m), weight 80.7 kg, SpO2 97 %.  PHYSICAL EXAMINATION:  Constitutional: Appears well-developed and well-nourished. No distress. HENT: Normocephalic. Marland Kitchen Oropharynx is clear and moist.  Eyes: Conjunctivae and EOM are normal. PERRLA, no scleral icterus.  Neck: Normal ROM. Neck supple. No JVD. No tracheal deviation. CVS: RRR, S1/S2 +, no murmurs, no gallops, no carotid bruit.  Pulmonary: Moderate coarse bronchial breath sounds, no stridor, rhonchi, wheezes, rales.  Abdominal: Soft. BS +,  no distension, tenderness, rebound or guarding.  Musculoskeletal:  Normal range of motion. No edema and no tenderness.  Neuro: Alert.  Awake, alert and oriented x3. No focal deficits. Skin: Skin is warm and dry. No rash noted. Psychiatric: Normal mood and affect.      LABORATORY PANEL:   CBC Recent Labs  Lab 10/19/18 0507  WBC 6.6  HGB 11.6*  HCT 37.5  PLT 154   ------------------------------------------------------------------------------------------------------------------  Chemistries  Recent Labs  Lab 10/21/18 0345  NA 138  K 4.1  CL 101  CO2 29  GLUCOSE 208*  BUN 20  CREATININE 0.87  CALCIUM 8.4*   ------------------------------------------------------------------------------------------------------------------  Cardiac Enzymes Recent Labs  Lab 10/18/18 1346  TROPONINI <0.03   ------------------------------------------------------------------------------------------------------------------  RADIOLOGY:  Dg Chest 2 View  Result Date: 10/21/2018 CLINICAL DATA:  Cough and shortness of breath. EXAM: CHEST - 2 VIEW COMPARISON:  October 19, 2018 FINDINGS: The cardiomediastinal silhouette is stable. No pneumothorax. No nodules or masses. No focal infiltrate. No other change. IMPRESSION: No active cardiopulmonary disease. Electronically Signed   By: Dorise Bullion III M.D   On: 10/21/2018 08:40     ASSESSMENT AND PLAN:   84 year old female with history of diabetes and hypothyroidism who presented with generalized weakness and dysuria.   1.  Acute hypoxic respiratory failure -acute bronchitis and metapneumo virus  Started patient on steroids and Rocephin and azithromycin.  Pneumonia ruled out we will discontinue azithromycin and continue Rocephin for acute bronchitis Repeat chest x-ray no pneumonia Wean oxygen as tolerated  2.  Acute kidney injury in the setting of UTI and dehydration which is improved  3.  UTI: Continue Rocephin and follow-up on urine culture  4.  Essential hypertension: Continue metoprolol  5.   Hypothyroidism: Continue Synthroid  Generalized weakness refusing skilled nursing facility  and requesting home health physical therapy.  Will discharge patient home with home health as per patient and family request   Therapy is recommended skilled nursing facility upon discharge.  Clinical social worker consult placed. Management plans discussed with the patient and patient's daughter-in-law Crystal Delacruz she is in agreement.  CODE STATUS: full  TOTAL TIME TAKING CARE OF THIS PATIENT: 33 minutes.     POSSIBLE D/C tomorrow, DEPENDING ON CLINICAL CONDITION.   Nicholes Mango M.D on 10/21/2018 at 1:16 PM  Between 7am to 6pm - Pager - [phone] After 6pm go to [email or web address] - password [email or web address] [facility]  Office  [phone]  CC: Primary care physician; Kirk Ruths, MD  Note: This dictation was prepared with Dragon dictation along with smaller phrase technology. Any transcriptional errors that result from this process are unintentional.

## 2018-10-21 NOTE — Care Management Note (Signed)
Case Management Note  Patient Details  Name: Crystal Delacruz MRN: [id number] Date of Birth: [birth year]   Patient admitted with acute hypoxic respiratory failure.  Assessment completed with patient, spouse, and daughter in law.    Patient lives at home with husband.  PCP Ouida Sills.  Patient states that her husband drives and provides transportation.  Patient denies issues with transportation or obtaining medication.   PT has assessed patient and recommends SNF.  Patient and family decline at this time.  Husband states "she doesn't need any rehab.  She is in the bed for 8 hours, and in her recliner for the other 16 hours.  She gets up an walks just fine with her walker to the bathroom."  Patient agreeable to home health services.  CMS Medicare.gov Compare Post Acute Care list reviewed with patient and family.  Daughter in law ask that I check with [place] or [facility] health first to see if either of them can accept the patient "otherwise we don't have a preference".    Referral made to [facility] with [place].  Patient currently requiring acute O2.  It does not appear that the patient has a qualifying diagnosis for home O2.     Subjective/Objective:                    Action/Plan:   Expected Discharge Date:                  Expected Discharge Plan:  [place]  In-House Referral:     Discharge planning Services  CM Consult  Post Acute Care Choice:  Home Health Choice offered to:  Patient, Spouse  DME Arranged:    DME Agency:     HH Arranged:  RN, PT, Nurse's Aide, Refused SNF [place] Agency:  [place]  Status of Service:  In process, will continue to follow  If discussed at Long Length of Stay Meetings, dates discussed:    Additional Comments:  Beverly Sessions, RN 10/21/2018, 2:57 PM

## 2018-10-21 NOTE — Progress Notes (Signed)
PHARMACIST - PHYSICIAN COMMUNICATION DR:   Margaretmary Eddy CONCERNING: Antibiotic IV to Oral Route Change Policy  RECOMMENDATION: This patient is receiving Azithromycin by the intravenous route.  Based on criteria approved by the Pharmacy and Therapeutics Committee, the antibiotic(s) is/are being converted to the equivalent oral dose form(s).   DESCRIPTION: These criteria include:  Patient being treated for a respiratory tract infection, urinary tract infection, cellulitis or clostridium difficile associated diarrhea if on metronidazole  The patient is not neutropenic and does not exhibit a GI malabsorption state  The patient is eating (either orally or via tube) and/or has been taking other orally administered medications for a least 24 hours  The patient is improving clinically and has a Tmax < 100.5  If you have questions about this conversion, please contact the [place], PharmD, BCPS Clinical Pharmacist 10/21/2018 9:36 AM

## 2018-10-22 LAB — GLUCOSE, CAPILLARY
GLUCOSE-CAPILLARY: 204 mg/dL — AB (ref 70–99)
Glucose-Capillary: 211 mg/dL — ABNORMAL HIGH (ref 70–99)
Glucose-Capillary: 279 mg/dL — ABNORMAL HIGH (ref 70–99)
Glucose-Capillary: 277 mg/dL — ABNORMAL HIGH (ref 70–99)
Glucose-Capillary: 175 mg/dL — ABNORMAL HIGH (ref 70–99)

## 2018-10-22 MED ORDER — INSULIN ASPART 100 UNIT/ML ~~LOC~~ SOLN
3.0000 [IU] | Freq: Three times a day (TID) | SUBCUTANEOUS | Status: DC
  Administered 2018-10-22 – 2018-10-27 (×13): 3 [IU] via SUBCUTANEOUS
  Filled 2018-10-22 (×13): qty 1

## 2018-10-22 NOTE — Progress Notes (Signed)
SATURATION QUALIFICATIONS: (This note is used to comply with regulatory documentation for home oxygen)  Patient Saturations on Room Air at Rest = 93%  Patient Saturations on Room Air while Ambulating = 87%  Patient Saturations on 2 Liters of oxygen while Ambulating = 93%  MD notified of results and pt was very short of breath while ambulating with oxygen on. She ambulated 5-10 feet.   Vikas Wegmann [employer]

## 2018-10-22 NOTE — Progress Notes (Signed)
RT to patient bedside for scheduled breathing treatment. Patient states she does not want her breathing treatment at this time. RT advised patient that it would be beneficial for her to take her treatment as she has inspiratory/expiratory wheezes. Patient strongly declined breathing treatment. RT advised patient to have Respiratory paged if shortness of breath or difficulty breathing increases. FIO2 increased from 2L to 3L to help support Oxygen. RN aware.

## 2018-10-22 NOTE — Progress Notes (Addendum)
RN notified MD that pt.'s family states that "pt is allergic to prednisone and makes her legs swell and she turns red." MD was made aware and no new orders given at this time. Family updated that RN has made MD aware. Pt is showing no signs of reaction at this time prednisone dose was given at 0924 and pt.'s family made RN aware around 59. RN will continue to monitor pt closely.   Jesse Nosbisch [employer]

## 2018-10-22 NOTE — Progress Notes (Signed)
[place] [facility] [place] NAME: Daysie Helf    MR#:  [id number]  DATE OF BIRTH:  1924-10-02  SUBJECTIVE:   Patient is still on 4 L of oxygen feeling weak, shortness of breath getting worse with ambulation She is less ambulatory at her baseline .  Husband and daughter-in-law at bedside  REVIEW OF SYSTEMS:    Review of Systems  Constitutional: Negative for fever, chills weight loss HENT: Negative for ear pain, nosebleeds, congestion, facial swelling, rhinorrhea, neck pain, neck stiffness and ear discharge.   Respiratory: Reporting chest tightness and shortness of breath, denies wheezing  Cardiovascular: Negative for chest pain, palpitations and leg swelling.  Gastrointestinal: Negative for heartburn, abdominal pain, vomiting, diarrhea or consitpation Genitourinary: +=dysuria,no urgency, frequency, hematuria Musculoskeletal: Negative for back pain or joint pain Neurological: Negative for dizziness, seizures, syncope, focal weakness,  numbness and headaches.  Hematological: Does not bruise/bleed easily.  Psychiatric/Behavioral: Negative for hallucinations, confusion, dysphoric mood    Tolerating Diet:yes      DRUG ALLERGIES:   Allergies  Allergen Reactions  . Latex Swelling  . Sulfur     VITALS:  Blood pressure 128/69, pulse 76, temperature 97.8 F (36.6 C), temperature source Oral, resp. rate 19, height 5\' 6"  (1.676 m), weight 80.7 kg, SpO2 93 %.  PHYSICAL EXAMINATION:  Constitutional: Appears well-developed and well-nourished. No distress. HENT: Normocephalic. Marland Kitchen Oropharynx is clear and moist.  Eyes: Conjunctivae and EOM are normal. PERRLA, no scleral icterus.  Neck: Normal ROM. Neck supple. No JVD. No tracheal deviation. CVS: RRR, S1/S2 +, no murmurs, no gallops, no carotid bruit.  Pulmonary: Moderate coarse bronchial breath sounds, no stridor, rhonchi, wheezes, rales.  Abdominal: Soft. BS +,  no distension, tenderness, rebound or  guarding.  Musculoskeletal: Normal range of motion. No edema and no tenderness.  Neuro: Alert.  Awake, alert and oriented x3. No focal deficits. Skin: Skin is warm and dry. No rash noted. Psychiatric: Normal mood and affect.      LABORATORY PANEL:   CBC Recent Labs  Lab 10/19/18 0507  WBC 6.6  HGB 11.6*  HCT 37.5  PLT 154   ------------------------------------------------------------------------------------------------------------------  Chemistries  Recent Labs  Lab 10/21/18 0345  NA 138  K 4.1  CL 101  CO2 29  GLUCOSE 208*  BUN 20  CREATININE 0.87  CALCIUM 8.4*   ------------------------------------------------------------------------------------------------------------------  Cardiac Enzymes Recent Labs  Lab 10/18/18 1346  TROPONINI <0.03   ------------------------------------------------------------------------------------------------------------------  RADIOLOGY:  Dg Chest 2 View  Result Date: 10/21/2018 CLINICAL DATA:  Cough and shortness of breath. EXAM: CHEST - 2 VIEW COMPARISON:  October 19, 2018 FINDINGS: The cardiomediastinal silhouette is stable. No pneumothorax. No nodules or masses. No focal infiltrate. No other change. IMPRESSION: No active cardiopulmonary disease. Electronically Signed   By: Dorise Bullion III M.D   On: 10/21/2018 08:40     ASSESSMENT AND PLAN:   84 year old female with history of diabetes and hypothyroidism who presented with generalized weakness and dysuria.   1.  Acute hypoxic respiratory failure -acute bronchitis and metapneumo virus  Started patient on steroids.  IV Solu-Medrol  Rocephin and azithromycin.  Pneumonia ruled out we will discontinue azithromycin and continue Rocephin for acute bronchitis Repeat chest x-ray no pneumonia Wean oxygen as tolerated  2.  Acute kidney injury in the setting of UTI and dehydration which is improved  3.  UTI: Continue Rocephin and follow-up on urine culture, ordered on  10/20/2018 does not seem to be done so far.  Discussed with RN I will try to get it done today  4.  Essential hypertension: Continue metoprolol  5.  Hypothyroidism: Continue Synthroid  Generalized weakness refusing skilled nursing facility and requesting home health physical therapy.  Will discharge patient home with home health as per patient and family request   Therapy is recommended skilled nursing facility upon discharge.  Clinical social worker consult placed. Management plans discussed with the patient and patient's daughter-in-law Hind Chesler she is in agreement.  CODE STATUS: full  TOTAL TIME TAKING CARE OF THIS PATIENT: 33 minutes.     POSSIBLE D/C tomorrow, DEPENDING ON CLINICAL CONDITION.   Nicholes Mango M.D on 10/22/2018 at 2:14 PM  Between 7am to 6pm - Pager - [phone] After 6pm go to [email or web address] - password [email or web address] [facility]  Office  [phone]  CC: Primary care physician; Kirk Ruths, MD  Note: This dictation was prepared with Dragon dictation along with smaller phrase technology. Any transcriptional errors that result from this process are unintentional.

## 2018-10-23 ENCOUNTER — Encounter: Payer: Self-pay | Admitting: Radiology

## 2018-10-23 ENCOUNTER — Inpatient Hospital Stay: Payer: PPO

## 2018-10-23 DIAGNOSIS — Z7189 Other specified counseling: Secondary | ICD-10-CM

## 2018-10-23 DIAGNOSIS — Z515 Encounter for palliative care: Secondary | ICD-10-CM

## 2018-10-23 DIAGNOSIS — R0902 Hypoxemia: Secondary | ICD-10-CM

## 2018-10-23 DIAGNOSIS — J9601 Acute respiratory failure with hypoxia: Secondary | ICD-10-CM

## 2018-10-23 DIAGNOSIS — J189 Pneumonia, unspecified organism: Secondary | ICD-10-CM

## 2018-10-23 DIAGNOSIS — N390 Urinary tract infection, site not specified: Secondary | ICD-10-CM

## 2018-10-23 DIAGNOSIS — J9801 Acute bronchospasm: Secondary | ICD-10-CM

## 2018-10-23 DIAGNOSIS — R0602 Shortness of breath: Secondary | ICD-10-CM

## 2018-10-23 LAB — BLOOD GAS, ARTERIAL
Acid-Base Excess: 8.6 mmol/L — ABNORMAL HIGH (ref 0.0–2.0)
Bicarbonate: 34.1 mmol/L — ABNORMAL HIGH (ref 20.0–28.0)
FIO2: 0.35
O2 Saturation: 91.8 %
PCO2 ART: 49 mmHg — AB (ref 32.0–48.0)
Patient temperature: 37
pH, Arterial: 7.45 (ref 7.350–7.450)
pO2, Arterial: 60 mmHg — ABNORMAL LOW (ref 83.0–108.0)

## 2018-10-23 LAB — GLUCOSE, CAPILLARY
GLUCOSE-CAPILLARY: 167 mg/dL — AB (ref 70–99)
Glucose-Capillary: 144 mg/dL — ABNORMAL HIGH (ref 70–99)
Glucose-Capillary: 200 mg/dL — ABNORMAL HIGH (ref 70–99)
Glucose-Capillary: 167 mg/dL — ABNORMAL HIGH (ref 70–99)

## 2018-10-23 LAB — CREATININE, SERUM
Creatinine, Ser: 0.99 mg/dL (ref 0.44–1.00)
GFR calc Af Amer: 57 mL/min — ABNORMAL LOW (ref >60)
GFR calc non Af Amer: 49 mL/min — ABNORMAL LOW (ref >60)

## 2018-10-23 LAB — PROCALCITONIN: Procalcitonin: <0.1 ng/mL

## 2018-10-23 LAB — MRSA PCR SCREENING: MRSA by PCR: NEGATIVE

## 2018-10-23 MED ORDER — ALBUTEROL SULFATE (2.5 MG/3ML) 0.083% IN NEBU
2.5000 mg | INHALATION_SOLUTION | RESPIRATORY_TRACT | Status: DC
  Administered 2018-10-23 – 2018-10-26 (×11): 2.5 mg via RESPIRATORY_TRACT
  Filled 2018-10-23 (×11): qty 3

## 2018-10-23 MED ORDER — METHYLPREDNISOLONE SODIUM SUCC 40 MG IJ SOLR
40.0000 mg | Freq: Two times a day (BID) | INTRAMUSCULAR | Status: DC
  Administered 2018-10-23 – 2018-10-24 (×3): 40 mg via INTRAVENOUS
  Filled 2018-10-23 (×3): qty 1

## 2018-10-23 MED ORDER — FUROSEMIDE 10 MG/ML IJ SOLN
20.0000 mg | Freq: Once | INTRAMUSCULAR | Status: AC
  Administered 2018-10-23: 20 mg via INTRAVENOUS
  Filled 2018-10-23: qty 4

## 2018-10-23 MED ORDER — VANCOMYCIN HCL 10 G IV SOLR
1250.0000 mg | INTRAVENOUS | Status: DC
  Administered 2018-10-23: 1250 mg via INTRAVENOUS
  Filled 2018-10-23 (×2): qty 1250

## 2018-10-23 MED ORDER — IOHEXOL 300 MG/ML  SOLN
75.0000 mL | Freq: Once | INTRAMUSCULAR | Status: AC | PRN
  Administered 2018-10-23: 60 mL via INTRAVENOUS

## 2018-10-23 MED ORDER — SODIUM CHLORIDE 0.9 % IV SOLN
3.0000 g | Freq: Four times a day (QID) | INTRAVENOUS | Status: DC
  Administered 2018-10-23 – 2018-10-24 (×4): 3 g via INTRAVENOUS
  Filled 2018-10-23 (×7): qty 3

## 2018-10-23 MED ORDER — BENZONATATE 100 MG PO CAPS
200.0000 mg | ORAL_CAPSULE | Freq: Three times a day (TID) | ORAL | Status: DC
  Administered 2018-10-23 – 2018-10-27 (×12): 200 mg via ORAL
  Filled 2018-10-23 (×12): qty 2

## 2018-10-23 NOTE — Consult Note (Signed)
                                                                                 Consultation Note Date: 10/23/2018   Patient Name: Crystal Delacruz  DOB: 03/10/1924  MRN: [id number]  Age / Sex: 84 y.o., female  PCP: Anderson, Marshall W, MD Referring Physician: Gouru, Aruna, MD  Reason for Consultation: Establishing goals of care  HPI/Patient Profile: 84 y.o. female admitted on 10/18/2018 from home with complaints of generalized weakness and cough. She has a past medical history of diabetes, hypertension, and hypothyroidism. During her ED course she complained of dysuria and foul-smelling urine with a low-grade fever. Patient appeared dry and dehydrated. Her UA was positive for UTI. She was started on IV Rocephin post cultures and started on IV fluids. She was initiated on supplemental oxygen via nasal cannula due to decreased oxygen saturations. Chest xray negative for pneumonia. Since admission patient continues on nasal cannula. She continues on Rocephin for her UTI and home medications. Palliative Medicine team consulted for goals of care.   Clinical Assessment and Goals of Care: I have reviewed medical records including lab results, imaging, Epic notes, and MAR, received report from the bedside RN, and assessed the patient. I met at the bedside with patient, her husband, daughter, and daughter-in-law, to discuss diagnosis prognosis, GOC, EOL wishes, disposition and options. Patient is A&O x3. She is somewhat sleepy. She engages in goals of care conversation.   I introduced Palliative Medicine as specialized medical care for people living with serious illness. It focuses on providing relief from the symptoms and stress of a serious illness. The goal is to improve quality of life for both the patient and the family.  We discussed a brief life review of the patient. Husband and family reports that prior to admission patient's quality of life was not the best. Family reports patient required  assistance with ADLs. She was able to feed self after set-up. Husband reports she would generally require assistance to living area were she sat in recliner all day until bed time. He reported she would sleep, watch tv, and read routinely. Family reports patient would go out of the house months ago to attend church but due to her decline, fatigue, and generalized weakness they would often not to attempt to take her out due to the difficulty of moving her around.   Family reports she generally has a good appetite and often loves to eat snacks throughout the day.   We discussed her current illness and what it means in the larger context of her on-going co-morbidities.  Natural disease trajectory and expectations at EOL were discussed. Family verbalized understanding of patient's condition. Husband verbalized they remain hopeful that patient will improve from her current illness and return home at baseline.   I attempted to elicit values and goals of care important to the patient.    The difference between aggressive medical intervention and comfort care was considered in light of the patient's goals of care. Family verbalized they would like to continue to treat the treatable while hospitalized.   They expressed that their goal is for patient to return home and be comfortable   in her own environment. We discussed goals and effectiveness of rehabilitation. Husband and patient both verbalized their awareness of patient's condition and that having PT in the home or at a facility will not change her baseline as she has been in her current state for some time. Patient and family has agreed that patient would not benefit from rehab.   I discussed with patient and her family about her current full code status. I discussed her what a potential code could look like with consideration of her current illness, co-morbidities, and overall functional state. Patient and family verbalized that they did not want to change  patient's code status to DNR/DNI. Patient and husband verbalized patient would be ok with aggressive treatments with limitations on CPR and intubation if patient was in or condition was irreversible or vegetative state or if patient had a terminal illness. I attempted to discuss patient's condition and current quality of life and how it would be after an event. Family verbalized understanding and again expressed wishes for code status to remain a full code and decisions would be made dependent on the condition/situation.   Hospice and Palliative Care services outpatient were explained and offered. Patient and family verbalized their understanding and awareness of hospice and palliative goals and philosophy of care. Family verbalized their familiarity with hospice services and had reservations given previous experience. I attempted to answer questions or concerns regarding previous experience. At this time family is agreeable to palliative and verbalized they would consider hospice. They are aware that they may transition care at anytime and can discuss with their outpatient team.   Questions and concerns were addressed.  Hard Choices booklet left for review. The family was encouraged to call with questions or concerns.  PMT will continue to support holistically.   Primary Decision Maker: Patient is A&O x3 able to make medical decisions. She expressed her husband would be her decision maker in the event she is unable to make decisions.  PATIENT    SUMMARY OF RECOMMENDATIONS    Full Code-as confirmed by patient/family. Patient and husband expressed that patient would not want CPR or intubation in the event she had irreversible damage, vegetative state, or a condition that is life limiting. I discussed making her a partial code, however familypatient not in agreement. Expressed to remain a full code.   Continue to treat the treatable.   Patient/Husband agreed patient's baseline is poor functionally and  that PT would not be beneficial. Declines further PT treatment at discharge. States their focus is on quality of life at this point and not forcing her to do activity that she was not doing previously.   Family reluctant to hospice due to previous family experiences. Agreeable to palliative and express they would have to consider hospice services although recommendations based on expressed goals of quality of life and comfort is stated.   Case Management consult for outpatient palliative at minimum.   Palliative Medicine team will continue to support patient, patient's family, and medical team during hospitalization.   Code Status/Advance Care Planning:  Full code   Palliative Prophylaxis:   Aspiration, Delirium Protocol, Frequent Pain Assessment and Turn Reposition  Additional Recommendations (Limitations, Scope, Preferences):  Full Scope Treatment  Psycho-social/Spiritual:   Desire for further Chaplaincy support:NO  Additional Recommendations: Education on Hospice  Prognosis:   Guarded to Poor-respiratory distress, metapneumovirus, decreased mobility, poor po intake, advanced age, UTI, diabetes, hypertension, hypothyroidism.   Discharge Planning: Home with Palliative Services      Primary   Diagnoses: Present on Admission: . UTI (urinary tract infection)   I have reviewed the medical record, interviewed the patient and family, and examined the patient. The following aspects are pertinent.  Past Medical History:  Diagnosis Date  . Diabetes mellitus without complication ([place])   . Hypertension   . Hypothyroidism    Social History   Socioeconomic History  . Marital status: Married    Spouse name: Not on file  . Number of children: Not on file  . Years of education: Not on file  . Highest education level: Not on file  Occupational History  . Occupation: retired  Scientific laboratory technician  . Financial resource strain: Not on file  . Food insecurity:    Worry: Not on file     Inability: Not on file  . Transportation needs:    Medical: Not on file    Non-medical: Not on file  Tobacco Use  . Smoking status: Never Smoker  . Smokeless tobacco: Never Used  Substance and Sexual Activity  . Alcohol use: No  . Drug use: Never  . Sexual activity: Not Currently  Lifestyle  . Physical activity:    Days per week: Not on file    Minutes per session: Not on file  . Stress: Not on file  Relationships  . Social connections:    Talks on phone: Not on file    Gets together: Not on file    Attends religious service: Not on file    Active member of club or organization: Not on file    Attends meetings of clubs or organizations: Not on file    Relationship status: Not on file  Other Topics Concern  . Not on file  Social History Narrative  . Not on file   History reviewed. No pertinent family history. Scheduled Meds: . albuterol  2.5 mg Nebulization Q4H while awake  . aspirin EC  81 mg Oral Daily  . benzonatate  200 mg Oral TID  . enoxaparin (LOVENOX) injection  40 mg Subcutaneous Q24H  . gabapentin  100 mg Oral BID  . insulin aspart  0-15 Units Subcutaneous TID WC  . insulin aspart  0-5 Units Subcutaneous QHS  . insulin aspart  3 Units Subcutaneous TID WC  . levothyroxine  50 mcg Oral QAC breakfast  . methylPREDNISolone (SOLU-MEDROL) injection  40 mg Intravenous Q12H  . metoprolol tartrate  50 mg Oral BID  . nortriptyline  25 mg Oral BID  . polyethylene glycol  17 g Oral Daily  . senna-docusate  1 tablet Oral QHS  . sodium chloride flush  3 mL Intravenous Q12H   Continuous Infusions: . sodium chloride 250 mL (10/23/18 1434)  . ampicillin-sulbactam (UNASYN) IV 3 g (10/23/18 1434)  . vancomycin 1,250 mg (10/23/18 1521)   PRN Meds:.sodium chloride, acetaminophen **OR** acetaminophen, albuterol, bisacodyl, ondansetron **OR** ondansetron (ZOFRAN) IV, sodium chloride flush, sodium phosphate, traMADol Medications Prior to Admission:  Prior to Admission  medications   Medication Sig Start Date End Date Taking? Authorizing Provider  acetaminophen (TYLENOL) 500 MG tablet Take 500 mg by mouth every 6 (six) hours as needed.   Yes [provider]  aspirin EC 81 MG tablet Take 81 mg by mouth daily.   Yes [provider]  gabapentin (NEURONTIN) 100 MG capsule Take 200 mg by mouth 3 (three) times daily. 07/31/18  Yes [provider]  glimepiride (AMARYL) 2 MG tablet Take 2 mg by mouth every morning. 09/15/18  Yes [provider]  levothyroxine (  SYNTHROID, LEVOTHROID) 50 MCG tablet Take 50 mcg by mouth daily. 08/08/18  Yes [provider]  metoprolol tartrate (LOPRESSOR) 50 MG tablet Take 50 mg by mouth 2 (two) times daily.   Yes [provider]  nortriptyline (PAMELOR) 25 MG capsule Take 25 mg by mouth 2 (two) times daily. 10/13/18  Yes [provider]  torsemide (DEMADEX) 20 MG tablet Take 40 mg by mouth daily. 09/05/18  Yes [provider]  traMADol (ULTRAM) 50 MG tablet Take 50 mg by mouth every 6 (six) hours as needed. 05/23/18  Yes [provider]   Allergies  Allergen Reactions  . Latex Swelling  . Sulfur    Review of Systems  Constitutional: Positive for activity change, appetite change and fatigue.  Genitourinary: Positive for dysuria.  Neurological: Positive for weakness.  All other systems reviewed and are negative.   Physical Exam Vitals signs and nursing note reviewed.  Constitutional:      Appearance: She is normal weight.     Comments: Chronically ill   Cardiovascular:     Rate and Rhythm: Normal rate.     Pulses: Normal pulses.     Heart sounds: Normal heart sounds.  Pulmonary:     Breath sounds: Decreased breath sounds and wheezing present.     Comments: 3L/[place], some shortness of breath, cough  Abdominal:     General: Bowel sounds are normal.     Palpations: Abdomen is soft.  Skin:    General: Skin is warm and dry.     Findings: Bruising  present.  Neurological:     Mental Status: She is alert and oriented to person, place, and time.     Motor: Weakness present.  Psychiatric:        Attention and Perception: Attention normal.        Mood and Affect: Mood normal.        Speech: Speech normal.        Behavior: Behavior normal.        Thought Content: Thought content normal.        Cognition and Memory: Cognition normal.        Judgment: Judgment normal.     Vital Signs: BP (!) 144/76 (BP Location: Right Leg)   Pulse 76   Temp 98.1 F (36.7 C) (Oral)   Resp 18   Ht 5' 6" (1.676 m)   Wt 80.7 kg   SpO2 90%   BMI 28.73 kg/m  Pain Scale: 0-10 POSS *See Group Information*: S-Acceptable,Sleep, easy to arouse Pain Score: 0-No pain   SpO2: SpO2: 90 % O2 Device:SpO2: 90 % O2 Flow Rate: .O2 Flow Rate (L/min): 3 L/min  IO: Intake/output summary:   Intake/Output Summary (Last 24 hours) at 10/23/2018 1554 Last data filed at 10/23/2018 0900 Gross per 24 hour  Intake 0 ml  Output 200 ml  Net -200 ml    LBM: Last BM Date: 10/22/18 Baseline Weight: Weight: 80.7 kg Most recent weight: Weight: 80.7 kg     Palliative Assessment/Data: PPS 30%    Time In: 1400 Time Out: 1515 Time Total: 75 min.  Greater than 50%  of this time was spent counseling and coordinating care related to the above assessment and plan.  Signed by:  Nikki Pickenpack-Cousar, AGPCNP-BC Palliative Medicine Team  Phone: [phone] Fax: [phone] Pager: [phone] Amion: N. Cousar    Please contact Palliative Medicine Team phone at [phone] for questions and concerns.  For individual provider: See Amion             

## 2018-10-23 NOTE — Progress Notes (Signed)
New referral for outpatient Palliative to follow at home received following a Palliative Medicine consult. CMRN Isaias Cowman made aware. Patient information faxed to referral. Flo Shanks RN, BSN, [facility] [place], hospital Liaison [phone]

## 2018-10-23 NOTE — Progress Notes (Signed)
PT Cancellation Note  Patient Details Name: Crystal Delacruz MRN: [id number] DOB: 08-26-1924   Cancelled Treatment:    Reason Eval/Treat Not Completed: Medical issues which prohibited therapy   MD and RN in room with pt upon arrival.  Request holding therapy this am due to respiratory concerns.  Will continue as appropriate.   Chesley Noon 10/23/2018, 9:49 AM

## 2018-10-23 NOTE — Consult Note (Addendum)
Infectious Disease     Reason for Consult: Metapneumonia virus    Referring Physician: Nicholes Mango Date of Admission:  10/18/2018   Active Problems:   UTI (urinary tract infection)   HPI: Crystal Delacruz is a 84 y.o. female admitted on December 14 with complaints of dysuria and weakness as well as low-grade fevers and dehydration.  On admission she was found to have sepsis with elevated lactate, hypoxia and was admitted.  Her initial temperature was 99.4 pulse white count was 7.1.  Urinalysis showed greater than 50 white cells and urine cultures pending.  Flu PCR was negative respiratory viral panel was positive for metapneumovirus.  Chest x-ray initially done showed some scarring in the right lung but no focal disease.  Follow-up imaging on this 15th was suggestive of developing left infiltrate.  CT scan done December 19 showed bronchiectatic changes as well as multifocal opacities consistent with pneumonia.   Past Medical History:  Diagnosis Date  . Diabetes mellitus without complication ([place])   . Hypertension   . Hypothyroidism    Past Surgical History:  Procedure Laterality Date  . none     Social History   Tobacco Use  . Smoking status: Never Smoker  . Smokeless tobacco: Never Used  Substance Use Topics  . Alcohol use: No  . Drug use: Never   History reviewed. No pertinent family history.  Allergies:  Allergies  Allergen Reactions  . Latex Swelling  . Sulfur     Current antibiotics: Antibiotics Given (last 72 hours)    Date/Time Action Medication Dose Rate   10/20/18 1620 New Bag/Given   azithromycin (ZITHROMAX) 500 mg in sodium chloride 0.9 % 250 mL IVPB 500 mg 250 mL/hr   10/20/18 2208 New Bag/Given   cefTRIAXone (ROCEPHIN) 1 g in sodium chloride 0.9 % 100 mL IVPB 1 g 200 mL/hr   10/21/18 1527 Given   azithromycin (ZITHROMAX) tablet 500 mg 500 mg    10/21/18 1711 New Bag/Given   cefTRIAXone (ROCEPHIN) 1 g in sodium chloride 0.9 % 100 mL IVPB 1 g 200 mL/hr   10/22/18 0925 Given   cefdinir (OMNICEF) capsule 300 mg 300 mg    10/22/18 1517 Given   azithromycin (ZITHROMAX) tablet 500 mg 500 mg    10/22/18 2104 Given   cefdinir (OMNICEF) capsule 300 mg 300 mg    10/23/18 1127 Given  [scanner not charging, service will be requested]   cefdinir (OMNICEF) capsule 300 mg 300 mg       MEDICATIONS: . albuterol  2.5 mg Nebulization Q4H while awake  . aspirin EC  81 mg Oral Daily  . enoxaparin (LOVENOX) injection  40 mg Subcutaneous Q24H  . gabapentin  100 mg Oral BID  . insulin aspart  0-15 Units Subcutaneous TID WC  . insulin aspart  0-5 Units Subcutaneous QHS  . insulin aspart  3 Units Subcutaneous TID WC  . levothyroxine  50 mcg Oral QAC breakfast  . methylPREDNISolone (SOLU-MEDROL) injection  40 mg Intravenous Q12H  . metoprolol tartrate  50 mg Oral BID  . nortriptyline  25 mg Oral BID  . polyethylene glycol  17 g Oral Daily  . senna-docusate  1 tablet Oral QHS  . sodium chloride flush  3 mL Intravenous Q12H    Review of Systems - 11 systems reviewed and negative per HPI   OBJECTIVE: Temp:  [98.1 F (36.7 C)-99.3 F (37.4 C)] 98.1 F (36.7 C) (12/19 1207) Pulse Rate:  [76-94] 76 (12/19 1207) Resp:  [  18-20] 18 (12/19 1207) BP: (144-174)/(69-82) 144/76 (12/19 1207) SpO2:  [89 %-94 %] 90 % (12/19 1207) Physical Exam  Constitutional:  Frail, coughing a great deal, awake and alert  HENT: [place]/AT, PERRLA, no scleral icterus Mouth/Throat: Oropharynx is clear but dry. No oropharyngeal exudate.  Cardiovascular: Normal rate, regular rhythm 2/6 sm Pulmonary/Chest: Poor air movement, rhonchi throughout Neck = supple, no nuchal rigidity Abdominal: Soft. Bowel sounds are normal.  exhibits no distension. There is no tenderness.  Lymphadenopathy: no cervical adenopathy. No axillary adenopathy Neurological: alert and oriented to person, place, and time.  Skin: Skin is warm and dry. No rash noted. No erythema.  Psychiatric: a normal mood and  affect.  behavior is normal.   LABS: Results for orders placed or performed during the hospital encounter of 10/18/18 (from the past 48 hour(s))  Glucose, capillary     Status: Abnormal   Collection Time: 10/21/18  4:25 PM  Result Value Ref Range   Glucose-Capillary 214 (H) 70 - 99 mg/dL   Comment 1 Notify RN   Glucose, capillary     Status: Abnormal   Collection Time: 10/21/18 10:32 PM  Result Value Ref Range   Glucose-Capillary 215 (H) 70 - 99 mg/dL  Glucose, capillary     Status: Abnormal   Collection Time: 10/22/18  7:50 AM  Result Value Ref Range   Glucose-Capillary 211 (H) 70 - 99 mg/dL  Glucose, capillary     Status: Abnormal   Collection Time: 10/22/18  9:21 AM  Result Value Ref Range   Glucose-Capillary 279 (H) 70 - 99 mg/dL  Glucose, capillary     Status: Abnormal   Collection Time: 10/22/18 11:21 AM  Result Value Ref Range   Glucose-Capillary 277 (H) 70 - 99 mg/dL  Glucose, capillary     Status: Abnormal   Collection Time: 10/22/18  4:56 PM  Result Value Ref Range   Glucose-Capillary 175 (H) 70 - 99 mg/dL  Glucose, capillary     Status: Abnormal   Collection Time: 10/22/18 10:11 PM  Result Value Ref Range   Glucose-Capillary 204 (H) 70 - 99 mg/dL  Glucose, capillary     Status: Abnormal   Collection Time: 10/23/18  7:38 AM  Result Value Ref Range   Glucose-Capillary 144 (H) 70 - 99 mg/dL   Comment 1 Notify RN   Blood gas, arterial     Status: Abnormal   Collection Time: 10/23/18  9:29 AM  Result Value Ref Range   FIO2 0.35    Delivery systems VENTURI MASK    pH, Arterial 7.45 7.350 - 7.450   pCO2 arterial 49 (H) 32.0 - 48.0 mmHg   pO2, Arterial 60 (L) 83.0 - 108.0 mmHg   Bicarbonate 34.1 (H) 20.0 - 28.0 mmol/L   Acid-Base Excess 8.6 (H) 0.0 - 2.0 mmol/L   O2 Saturation 91.8 %   Patient temperature 37.0    Collection site RIGHT RADIAL    Sample type ARTERIAL DRAW    Allens test (pass/fail) PASS PASS    Comment: Performed at [facility], [address], [place] [address]  Glucose, capillary     Status: Abnormal   Collection Time: 10/23/18 12:04 PM  Result Value Ref Range   Glucose-Capillary 167 (H) 70 - 99 mg/dL   Comment 1 Notify RN   Creatinine, serum     Status: Abnormal   Collection Time: 10/23/18 12:29 PM  Result Value Ref Range   Creatinine, Ser 0.99 0.44 - 1.00 mg/dL  GFR calc non Af Amer 49 (L) >60 mL/min   GFR calc Af Amer 57 (L) >60 mL/min    Comment: Performed at [facility], [address] [place], [place] [address]   No components found for: ESR, C REACTIVE PROTEIN MICRO: Recent Results (from the past 720 hour(s))  Respiratory Panel by PCR     Status: Abnormal   Collection Time: 10/20/18  4:13 PM  Result Value Ref Range Status   Adenovirus NOT DETECTED NOT DETECTED Final   Coronavirus 229E NOT DETECTED NOT DETECTED Final   Coronavirus HKU1 NOT DETECTED NOT DETECTED Final   Coronavirus NL63 NOT DETECTED NOT DETECTED Final   Coronavirus OC43 NOT DETECTED NOT DETECTED Final   Metapneumovirus DETECTED (A) NOT DETECTED Final   Rhinovirus / Enterovirus NOT DETECTED NOT DETECTED Final   Influenza A NOT DETECTED NOT DETECTED Final   Influenza B NOT DETECTED NOT DETECTED Final   Parainfluenza Virus 1 NOT DETECTED NOT DETECTED Final   Parainfluenza Virus 2 NOT DETECTED NOT DETECTED Final   Parainfluenza Virus 3 NOT DETECTED NOT DETECTED Final   Parainfluenza Virus 4 NOT DETECTED NOT DETECTED Final   Respiratory Syncytial Virus NOT DETECTED NOT DETECTED Final   Bordetella pertussis NOT DETECTED NOT DETECTED Final   Chlamydophila pneumoniae NOT DETECTED NOT DETECTED Final   Mycoplasma pneumoniae NOT DETECTED NOT DETECTED Final    Comment: Performed at [facility], [place] [address] [place], [place] [address]    IMAGING: Dg Chest 1 View  Result Date: 10/19/2018 CLINICAL DATA:  Continued upper airway cough since admission yesterday, generalized weakness, nasal congestion, history  diabetes mellitus, hypertension EXAM: CHEST  1 VIEW COMPARISON:  Portable exam 1054 hours compared to 10/18/2018 FINDINGS: Slightly rotated to the RIGHT. Upper normal heart size. Stable mediastinal contours. Minimal subsegmental atelectasis mid to lower RIGHT lung. Probable LEFT infrahilar infiltrate with mild associated peribronchial thickening. No pleural effusion or pneumothorax. Bones demineralized. IMPRESSION: Mild RIGHT basilar atelectasis. Bronchitic changes with suspected developing LEFT lower lobe infiltrate. Electronically Signed   By: Lavonia Dana M.D.   On: 10/19/2018 11:20   Dg Chest 2 View  Result Date: 10/21/2018 CLINICAL DATA:  Cough and shortness of breath. EXAM: CHEST - 2 VIEW COMPARISON:  October 19, 2018 FINDINGS: The cardiomediastinal silhouette is stable. No pneumothorax. No nodules or masses. No focal infiltrate. No other change. IMPRESSION: No active cardiopulmonary disease. Electronically Signed   By: Dorise Bullion III M.D   On: 10/21/2018 08:40   Ct Chest W Contrast  Result Date: 10/23/2018 CLINICAL DATA:  Acute hypoxic respiratory failure, shortness of breath EXAM: CT CHEST WITH CONTRAST TECHNIQUE: Multidetector CT imaging of the chest was performed during intravenous contrast administration. CONTRAST:  55m OMNIPAQUE IOHEXOL 300 MG/ML  SOLN COMPARISON:  Chest x-ray of 10/21/2018 FINDINGS: Cardiovascular: There is good opacification of the thoracic aorta and significant thoracic aortic atherosclerosis is present. The mid ascending thoracic aorta measures 43 mm in diameter indicative of fusiform aneurysmal dilatation. Recommend annual imaging followup by CTA or MRA. This recommendation follows 2010 ACCF/AHA/AATS/ACR/ASA/SCA/SCAI/SIR/STS/SVM Guidelines for the Diagnosis and Management of Patients with Thoracic Aortic Disease. Circulation. 2010; 121:: P591-M384 The heart is minimally enlarged and no pericardial effusion is seen. Coronary artery calcifications are noted diffusely.  There is good opacification of the pulmonary arteries which appears slightly prominent which may indicate a degree of pulmonary arterial hypertension. No embolus is evident. Mediastinum/Nodes: There is mild left hilar and mediastinal adenopathy. Within the precarinal mediastinum there is  a 16 mm node on image 48 series 2. Within the left hilum there is a node of 9 mm on image 63. No significant adenopathy is seen. The thyroid gland is well visualized and there is a 9 mm low-attenuation nodule in the left lobe of thyroid. In this age patient this finding is of doubtful clinical significance. Also there is some calcification within the left lobe of thyroid as well. There is significant degenerative change at the sternoclavicular joints bilaterally. Lungs/Pleura: On lung window images, there are parenchymal opacities within the posterior inferior right upper lobe, right middle lobe, right lower lobe posteriorly, left lower lobe particularly this appears segment, in the inferior aspect of the left upper lobe indicative of multifocal pneumonia. Air bronchograms course through several of these opacities. There may be very mild bronchiectatic change present particularly in the right lower lobe posteriorly. No pleural effusion is seen. No suspicious lung nodule is evident. Upper Abdomen: The portion of the upper abdomen that is visualized and this study is unremarkable. Musculoskeletal: There is a thoracic scoliosis convex the left with diffuse degenerative change throughout the thoracic spine. No compression deformity is seen. IMPRESSION: 1. No acute abnormality of the thoracic aorta and no pulmonary embolism is seen. 2. However there are patchy opacities throughout both lungs consistent with multifocal pneumonia. Mild bronchiectatic change is present in the right lower lobe posteriorly. 3. Fusiform dilatation of the mid ascending thoracic aorta measuring 4.3 cm. Recommend annual imaging followup by CTA or MRA. This  recommendation follows 2010 ACCF/AHA/AATS/ACR/ASA/SCA/SCAI/SIR/STS/SVM Guidelines for the Diagnosis and Management of Patients with Thoracic Aortic Disease. Circulation. 2010; 121: I786-V672. 4. Thoracic scoliosis with diffuse degenerative change. Electronically Signed   By: Ivar Drape M.D.   On: 10/23/2018 11:06   Dg Chest Portable 1 View  Result Date: 10/18/2018 CLINICAL DATA:  84 year old female with upper airway cough for the past few days EXAM: PORTABLE CHEST 1 VIEW COMPARISON:  Prior chest x-ray 01/07/2016 FINDINGS: Cardiac and mediastinal contours are within normal limits. Linear atelectasis versus scarring in the right upper lobe is similar compared to prior. No focal airspace consolidation, pulmonary edema, pleural effusion or pneumothorax. No acute osseous abnormality. IMPRESSION: Stable chest x-ray without evidence of acute cardiopulmonary process. Electronically Signed   By: Jacqulynn Cadet M.D.   On: 10/18/2018 13:17    Assessment:   JEMIMAH CRESSY is a 84 y.o. female admitted with weakness and dysuria and found to have urinary tract infection with urine cultures pending as well as respiratory infection with metapneumovirus.  She has had progressive changes on her CXR and CT showing multifocal infiltrates and bronchiectasis.  Respiratory viral panel is positive for metapneumovirus but negative for influenza.  She has been on azithromycin and ceftriaxone for 3 days and then cefdinir for the last 2 days.  Her family members all report that they have had a similar illness ongoing for the last 1 to 2 weeks. There is some concern for aspiration.  She has been seen by speech and swallow currently.   I do think her pneumonia is likely from metapneumovirus directly given her advanced age which is a risk for metapneumovirus pneumonia however it is difficult to sort out whether she could have bacterial superinfection or aspiration pneumonia given her imaging findings.  He has been changed today to  Unasyn  Recommendations Continue Unasyn and vanco for now pending labs below Speech and swallow evaluation. Check MRSA PCR - if neg dc vanco  Check procalcitonin and trend it if elevated.  If low can stop antibiotics. Symptomatic management- would use cough suppressants liberally.   Thank you very much for allowing me to participate in the care of this patient. Please call with questions.   Cheral Marker. Ola Spurr, MD

## 2018-10-23 NOTE — Consult Note (Signed)
Pharmacy Antibiotic Note  Crystal Delacruz is a 84 y.o. female admitted on 10/18/2018 with aspiration pneumonia.  Pharmacy has been consulted for Unasyn/Vancomycin dosing.  Plan: Unasyn 3g q 6h  Vancomycin 1250 q 24h with a trough drawn 1/2 hour prior to the 4th dose (anticipated trough ~15)  Vd 56.5, Ke 0.048, and t1/2 14.4  Height: 5\' 6"  (167.6 cm) Weight: 178 lb (80.7 kg) IBW/kg (Calculated) : 59.3  Temp (24hrs), Avg:98.7 F (37.1 C), Min:98.1 F (36.7 C), Max:99.3 F (37.4 C)  Recent Labs  Lab 10/18/18 1346 10/18/18 1827 10/19/18 0507 10/21/18 0345  WBC 7.1  --  6.6  --   CREATININE 1.40*  --  1.16* 0.87  LATICACIDVEN 2.7* 2.0*  --   --     Estimated Creatinine Clearance: 44.2 mL/min (by C-G formula based on SCr of 0.87 mg/dL).    Allergies  Allergen Reactions  . Latex Swelling  . Sulfur     Antimicrobials this admission:  CTX 12/14 >> 12/17 Cefdinir 12/18>>12/19 Azithromycin 12/16>>12/18 Unasyn 12/19>> Vancomycin 12/19>>  Dose adjustments this admission: N/A  Microbiology results: Resp Panel 12/16>>Metapneumovirus detected UCx 12/16 >> pending UCx 12/18 >> pending  Thank you for allowing pharmacy to be a part of this patient's care.  Lu Duffel, PharmD, BCPS Clinical Pharmacist 10/23/2018 12:30 PM

## 2018-10-23 NOTE — Progress Notes (Signed)
[place] [facility] [place] NAME: Crystal Delacruz    MR#:  [id number]  DATE OF BIRTH:  1923/11/16  SUBJECTIVE:   Patient is still on 2-3  L of oxygen , wheezing and shortness of breath getting worse .  She is placed on Ventimask She is less ambulatory at her baseline .  Husband and daughter-in-law at bedside  REVIEW OF SYSTEMS:    Review of Systems  Constitutional: Negative for fever, chills weight loss HENT: Negative for ear pain, nosebleeds, congestion, facial swelling, rhinorrhea, neck pain, neck stiffness and ear discharge.   Respiratory: Reporting chest tightness and shortness of breath getting worse, wheezing  Cardiovascular: Negative for chest pain, palpitations and leg swelling.  Gastrointestinal: Negative for heartburn, abdominal pain, vomiting, diarrhea or consitpation Genitourinary: +=dysuria,no urgency, frequency, hematuria Musculoskeletal: Negative for back pain or joint pain Neurological: Negative for dizziness, seizures, syncope, focal weakness,  numbness and headaches.  Hematological: Does not bruise/bleed easily.  Psychiatric/Behavioral: Negative for hallucinations, confusion, dysphoric mood    Tolerating Diet:yes      DRUG ALLERGIES:   Allergies  Allergen Reactions  . Latex Swelling  . Sulfur     VITALS:  Blood pressure (!) 145/69, pulse 88, temperature 99.3 F (37.4 C), temperature source Oral, resp. rate 20, height 5\' 6"  (1.676 m), weight 80.7 kg, SpO2 91 %.  PHYSICAL EXAMINATION:  Constitutional: Appears well-developed and well-nourished. No distress. HENT: Normocephalic. Marland Kitchen Oropharynx is clear and moist.  Eyes: Conjunctivae and EOM are normal. PERRLA, no scleral icterus.  Neck: Normal ROM. Neck supple. No JVD. No tracheal deviation. CVS: RRR, S1/S2 +, no murmurs, no gallops, no carotid bruit.  Pulmonary: Moderate coarse bronchial breath sounds, no stridor, rhonchi, diffuse wheezes, no rales.  Abdominal: Soft. BS +,   no distension, tenderness, rebound or guarding.  Musculoskeletal: Normal range of motion. No edema and no tenderness.  Neuro: Alert.  Awake, alert and oriented x3. No focal deficits. Skin: Skin is warm and dry. No rash noted. Psychiatric: Normal mood and affect.      LABORATORY PANEL:   CBC Recent Labs  Lab 10/19/18 0507  WBC 6.6  HGB 11.6*  HCT 37.5  PLT 154   ------------------------------------------------------------------------------------------------------------------  Chemistries  Recent Labs  Lab 10/21/18 0345  NA 138  K 4.1  CL 101  CO2 29  GLUCOSE 208*  BUN 20  CREATININE 0.87  CALCIUM 8.4*   ------------------------------------------------------------------------------------------------------------------  Cardiac Enzymes Recent Labs  Lab 10/18/18 1346  TROPONINI <0.03   ------------------------------------------------------------------------------------------------------------------  RADIOLOGY:  No results found.   ASSESSMENT AND PLAN:   84 year old female with history of diabetes and hypothyroidism who presented with generalized weakness and dysuria.   1.  Acute hypoxic respiratory failure -acute bronchitis and metapneumo virus  Worsening clinically We will get CT chest Pulmonology and ID consult   IV Solu-Medrol will be continued  Rocephin and azithromycin.  Pneumonia ruled out, discontinued azithromycin and continue Rocephin for acute bronchitis Repeat chest x-ray no pneumonia Wean oxygen as tolerated  2.  Acute kidney injury in the setting of UTI and dehydration which is improved  3.  UTI: Continue Rocephin and follow-up on urine culture, ordered on 10/20/2018 does not seem to be done until 10/22/2018 Urine culture is pending  4.  Essential hypertension: Continue metoprolol  5.  Hypothyroidism: Continue Synthroid  Generalized weakness refusing skilled nursing facility and requesting home health physical therapy.  Will discharge  patient home with home health as per patient and family  request   Therapy is recommended skilled nursing facility upon discharge.  Clinical social worker consult placed. Management plans discussed with the patient and patient's husband Agreeable with the plan of care CODE STATUS: full  TOTAL TIME TAKING CARE OF THIS PATIENT: 33 minutes.     POSSIBLE D/C tomorrow, DEPENDING ON CLINICAL CONDITION.   Nicholes Mango M.D on 10/23/2018 at 10:21 AM  Between 7am to 6pm - Pager - [phone] After 6pm go to [email or web address] - password [email or web address]  Office  [phone]  CC: Primary care physician; Kirk Ruths, MD  Note: This dictation was prepared with Dragon dictation along with smaller phrase technology. Any transcriptional errors that result from this process are unintentional.

## 2018-10-23 NOTE — Consult Note (Addendum)
Reason for Consult: Hypoxia, respiratory distress Referring Physician: Nicholes Mango, MD  Crystal Delacruz is an 84 y.o. female.  HPI:  The patient is a 84 year old lifelong never smoker who presented to [facility] on 14 December with a complaint of generalized weakness.  She also was having issues with dysuria and foul-smelling urine.  She was admitted for urinary tract infection and dehydration.  She received IV fluids and IV Rocephin.  Patient was noted to have no evidence of pneumonia initial chest x-ray..  Over the course of the stay in the hospital she developed a cough and became short of breath in or around 16 December.  She had a respiratory panel by nasopharyngeal swab performed on that date that showed metapneumovirus.  Because of mild bronchospasm she was placed on DuoNeb 3 times a day and placed on IV Solu-Medrol.  She has continued to have issues with cough and shortness of breath and arterial blood gas was obtained that shows compensated pH at 7.45, mildly increased PaCO2 at 49 and hypoxemia with PaO2 at 60 mmHg.  CT scan of the chest performed this morning showed that the patient has multiple patchy opacities consistent with pneumonia.  This could represent viral pneumonia but bacterial superinfection cannot be ruled out other possibilities include aspiration.  On my evaluation the patient is mildly tachypneic.  She has to pause to breathe midsentence.  She does not however, appear toxic.  She states that she has had a nonproductive cough but that she feels a "rattle" in her upper chest.  She is otherwise comfortable with nasal cannula O2 at 3 L/min.  She voices no other complaint at present.  She appears mildly befuddled at times.  We had a discussion about advanced directives and discussed goals of care.  She is willing to try BiPAP if necessary but does not wish to be intubated or have chest compressions.  I concur with this decision as it would be hard with severe morbidity in this patient with  advanced age, frailty and debility.     Past Medical History:  Diagnosis Date  . Diabetes mellitus without complication ([place])   . Hypertension   . Hypothyroidism     Past Surgical History:  Procedure Laterality Date  . none      History reviewed. No pertinent family history.  Social History:  reports that she has never smoked. She has never used smokeless tobacco. She reports that she does not drink alcohol or use drugs.  She is married lives with her husband.  Has an attentive daughter.  She is limited with activities of daily living and requires a walker for ambulation.  Allergies:  Allergies  Allergen Reactions  . Latex Swelling  . Sulfur     Medications: I have reviewed the patient's current medications.  Results for orders placed or performed during the hospital encounter of 10/18/18 (from the past 48 hour(s))  Glucose, capillary     Status: Abnormal   Collection Time: 10/21/18  4:25 PM  Result Value Ref Range   Glucose-Capillary 214 (H) 70 - 99 mg/dL   Comment 1 Notify RN   Glucose, capillary     Status: Abnormal   Collection Time: 10/21/18 10:32 PM  Result Value Ref Range   Glucose-Capillary 215 (H) 70 - 99 mg/dL  Glucose, capillary     Status: Abnormal   Collection Time: 10/22/18  7:50 AM  Result Value Ref Range   Glucose-Capillary 211 (H) 70 - 99 mg/dL  Glucose, capillary  Status: Abnormal   Collection Time: 10/22/18  9:21 AM  Result Value Ref Range   Glucose-Capillary 279 (H) 70 - 99 mg/dL  Glucose, capillary     Status: Abnormal   Collection Time: 10/22/18 11:21 AM  Result Value Ref Range   Glucose-Capillary 277 (H) 70 - 99 mg/dL  Glucose, capillary     Status: Abnormal   Collection Time: 10/22/18  4:56 PM  Result Value Ref Range   Glucose-Capillary 175 (H) 70 - 99 mg/dL  Glucose, capillary     Status: Abnormal   Collection Time: 10/22/18 10:11 PM  Result Value Ref Range   Glucose-Capillary 204 (H) 70 - 99 mg/dL  Glucose, capillary     Status:  Abnormal   Collection Time: 10/23/18  7:38 AM  Result Value Ref Range   Glucose-Capillary 144 (H) 70 - 99 mg/dL   Comment 1 Notify RN   Blood gas, arterial     Status: Abnormal   Collection Time: 10/23/18  9:29 AM  Result Value Ref Range   FIO2 0.35    Delivery systems VENTURI MASK    pH, Arterial 7.45 7.350 - 7.450   pCO2 arterial 49 (H) 32.0 - 48.0 mmHg   pO2, Arterial 60 (L) 83.0 - 108.0 mmHg   Bicarbonate 34.1 (H) 20.0 - 28.0 mmol/L   Acid-Base Excess 8.6 (H) 0.0 - 2.0 mmol/L   O2 Saturation 91.8 %   Patient temperature 37.0    Collection site RIGHT RADIAL    Sample type ARTERIAL DRAW    Allens test (pass/fail) PASS PASS    Comment: Performed at [facility], [address] [place], [place] [address]  Glucose, capillary     Status: Abnormal   Collection Time: 10/23/18 12:04 PM  Result Value Ref Range   Glucose-Capillary 167 (H) 70 - 99 mg/dL   Comment 1 Notify RN   Creatinine, serum     Status: Abnormal   Collection Time: 10/23/18 12:29 PM  Result Value Ref Range   Creatinine, Ser 0.99 0.44 - 1.00 mg/dL   GFR calc non Af Amer 49 (L) >60 mL/min   GFR calc Af Amer 57 (L) >60 mL/min    Comment: Performed at [facility], [address] [place], [place] [address]    Ct Chest W Contrast  Result Date: 10/23/2018 CLINICAL DATA:  Acute hypoxic respiratory failure, shortness of breath EXAM: CT CHEST WITH CONTRAST TECHNIQUE: Multidetector CT imaging of the chest was performed during intravenous contrast administration. CONTRAST:  2mL OMNIPAQUE IOHEXOL 300 MG/ML  SOLN COMPARISON:  Chest x-ray of 10/21/2018 FINDINGS: Cardiovascular: There is good opacification of the thoracic aorta and significant thoracic aortic atherosclerosis is present. The mid ascending thoracic aorta measures 43 mm in diameter indicative of fusiform aneurysmal dilatation. Recommend annual imaging followup by CTA or MRA. This recommendation follows 2010  ACCF/AHA/AATS/ACR/ASA/SCA/SCAI/SIR/STS/SVM Guidelines for the Diagnosis and Management of Patients with Thoracic Aortic Disease. Circulation. 2010; 121: I097-D532. The heart is minimally enlarged and no pericardial effusion is seen. Coronary artery calcifications are noted diffusely. There is good opacification of the pulmonary arteries which appears slightly prominent which may indicate a degree of pulmonary arterial hypertension. No embolus is evident. Mediastinum/Nodes: There is mild left hilar and mediastinal adenopathy. Within the precarinal mediastinum there is a 16 mm node on image 48 series 2. Within the left hilum there is a node of 9 mm on image 63. No significant adenopathy is seen. The thyroid gland is well visualized and there is a  9 mm low-attenuation nodule in the left lobe of thyroid. In this age patient this finding is of doubtful clinical significance. Also there is some calcification within the left lobe of thyroid as well. There is significant degenerative change at the sternoclavicular joints bilaterally. Lungs/Pleura: On lung window images, there are parenchymal opacities within the posterior inferior right upper lobe, right middle lobe, right lower lobe posteriorly, left lower lobe particularly this appears segment, in the inferior aspect of the left upper lobe indicative of multifocal pneumonia. Air bronchograms course through several of these opacities. There may be very mild bronchiectatic change present particularly in the right lower lobe posteriorly. No pleural effusion is seen. No suspicious lung nodule is evident. Upper Abdomen: The portion of the upper abdomen that is visualized and this study is unremarkable. Musculoskeletal: There is a thoracic scoliosis convex the left with diffuse degenerative change throughout the thoracic spine. No compression deformity is seen. IMPRESSION: 1. No acute abnormality of the thoracic aorta and no pulmonary embolism is seen. 2. However there are  patchy opacities throughout both lungs consistent with multifocal pneumonia. Mild bronchiectatic change is present in the right lower lobe posteriorly. 3. Fusiform dilatation of the mid ascending thoracic aorta measuring 4.3 cm. Recommend annual imaging followup by CTA or MRA. This recommendation follows 2010 ACCF/AHA/AATS/ACR/ASA/SCA/SCAI/SIR/STS/SVM Guidelines for the Diagnosis and Management of Patients with Thoracic Aortic Disease. Circulation. 2010; 121: U045-W098. 4. Thoracic scoliosis with diffuse degenerative change. Electronically Signed   By: Ivar Drape M.D.   On: 10/23/2018 11:06    Review of Systems  Constitutional: Positive for malaise/fatigue.  HENT: Positive for sore throat.   Eyes: Negative.   Respiratory: Positive for cough and shortness of breath. Negative for sputum production.   Cardiovascular: Positive for orthopnea.  Gastrointestinal: Negative for heartburn.  Genitourinary: Positive for frequency and urgency.  Musculoskeletal: Positive for myalgias.  Neurological: Positive for weakness.  Endo/Heme/Allergies: Negative.   Psychiatric/Behavioral: Negative.    Blood pressure (!) 144/76, pulse 76, temperature 98.1 F (36.7 C), temperature source Oral, resp. rate 18, height 5\' 6"  (1.676 m), weight 80.7 kg, SpO2 90 %. Physical Exam  Constitutional: She is oriented to person, place, and time. She appears well-developed.  Overweight elderly, frail woman.  HENT:  Head: Normocephalic and atraumatic.  Right Ear: External ear normal.  Left Ear: External ear normal.  Dry mucous membranes.  Eyes: Pupils are equal, round, and reactive to light. Conjunctivae and EOM are normal. No scleral icterus.  Neck: Neck supple. No JVD present. No tracheal deviation present. No thyromegaly present.  Pseudo-wheeze noted in the upper airway.  Cardiovascular: Normal rate, regular rhythm, normal heart sounds and intact distal pulses.  Respiratory: Tachypnea noted. No respiratory distress. She has  wheezes in the right middle field and the left middle field. She has rhonchi in the right middle field, the left middle field and the left lower field. She has no rales.  GI: Soft. Bowel sounds are normal. She exhibits no distension.  Musculoskeletal:        General: No deformity or edema.  Lymphadenopathy:    She has no cervical adenopathy.  Neurological: She is alert and oriented to person, place, and time. No cranial nerve deficit.  Skin: Skin is warm and dry. She is not diaphoretic. No erythema.  Fragile skin with poor  turgor.  Psychiatric: She has a normal mood and affect.  Occasionally befuddled but easily reoriented.    Assessment/Plan: 1.  Acute hypoxic respiratory failure due to pneumonia: Titrate oxygen  supplementation for saturations of 90% or better.  She may need BiPAP.  She has elected for no intubation.  2.  Pneumonia: No organism specified.  She may have started with a viral illness and now may have bacterial pneumonia superimposed.  Recommend broadening antibiotics which has already been done.  Flutter valve for pulmonary toilet.  May need evaluation for swallowing exclude potential aspiration.  3.  Bronchospasm associated with the above: Recommend nebulization treatments every 4 hours while awake.  Continue IV steroids.  4.  Advanced age and frailty: Discussed goals of care.  Patient is amenable to have service of management but no intubation or CPR.   This was discussed with Dr. Margaretmary Eddy.    Vernard Gambles 10/23/2018, 1:26 PM

## 2018-10-23 NOTE — Evaluation (Addendum)
Clinical/Bedside Swallow Evaluation Patient Details  Name: Crystal Delacruz MRN: [id number] Date of Birth: Jul 29, 1924  Today's Date: 10/23/2018 Time: SLP Start Time (ACUTE ONLY): 1315 SLP Stop Time (ACUTE ONLY): 1415 SLP Time Calculation (min) (ACUTE ONLY): 60 min  Past Medical History:  Past Medical History:  Diagnosis Date  . Diabetes mellitus without complication ([place])   . Hypertension   . Hypothyroidism    Past Surgical History:  Past Surgical History:  Procedure Laterality Date  . none     HPI:  Pt is a 84 y.o. female with a known history of diabetes mellitus, hypertension, hypothyroidism presented to the emergency room for generalized weakness.  Patient complains of dysuria and foul-smelling urine.  States she had some low-grade fever.  Appears dry and dehydrated.  Patient was evaluated in the emergency room her urinalysis was positive for infection and she was given IV Rocephin antibiotic.  Lactic acid was elevated.  Was started on IV fluids.  Oxygen saturation was low when she presented to the emergency room patient was put on oxygen via nasal cannula. chest x-ray did not reveal any pneumonia.  Patient uses a walker at home to ambulate. Per CXR, atelectasis versus scarring in the right upper lobe is similar compared to prior scans. Unsure if pt has a baseline of Reflux/GERD. Noted in previous scan, there was a noting of possible gastritis.    Assessment / Plan / Recommendation Clinical Impression  Pt appeared to exhibit grossly adequate oropharyngeal phase swallowing function w/ no immediate, overt s/s of aspiration during po trials. Pt only accepted a few trials altogether stating she did not "want much". Family endorsed pt had not been eating/drinking "much" since being sick. Oral phase appeared grosly wfl for bolus management and timely oral clearing b/t trials. Pt required some setup and support w/ self-feeding during po trials d/t overall weakness. Pt does have some respiratory  decline from the respiratory infection with metapneumovirus.  She has had progressive changes on her CXR and CT showing multifocal infiltrates and bronchiectasis. Much education was done w/ family present on general aspiration precautions.  Recommend modifying diet to a level 3, soft and moistened foods; thin liquids. Recommend general aspiration precautions w/ Pills whole in puree for easier, safer swallowing. Recommend assistance w/ meals. ST services will f/u to monitor pt's toleration of diet while admitted. NSG updated.  SLP Visit Diagnosis: Dysphagia, oropharyngeal phase (R13.12)(overall weakness; respiratory decline)    Aspiration Risk  Mild aspiration risk(but reduced following aspiration precautions)    Diet Recommendation  Dysphagia level 3 (Mech soft, moist foods) w/ Thin liquids. Aspiration precautions; support w/ tray setup at meals.   Medication Administration: Whole meds with puree(for safer swallowing)    Other  Recommendations Recommended Consults: (Dietician f/u; Palliative Care f/u for Max) Oral Care Recommendations: Oral care BID;Staff/trained caregiver to provide oral care Other Recommendations: (n/a)   Follow up Recommendations None      Frequency and Duration min 1 x/week  1 week       Prognosis Prognosis for Safe Diet Advancement: Fair(-Good) Barriers to Reach Goals: (advanced age)      Swallow Study   General Date of Onset: 10/20/18 HPI: Per CXR, atelectasis versus scarring in the right upper lobe is similar compared to prior scans. Unsure if pt has a baseline of Reflux/GERD. Noted in previous scan, there was a noting of possible gastritis.  Type of Study: Bedside Swallow Evaluation Previous Swallow Assessment: none reported Diet Prior to this Study: Regular;Thin liquids(and at  home) Temperature Spikes Noted: No(wbc 6.6; temp 99.3) Respiratory Status: Nasal cannula(3-6 liters) History of Recent Intubation: No Behavior/Cognition: Alert;Cooperative;Pleasant  mood;Confused;Distractible;Requires cueing Oral Cavity Assessment: Within Functional Limits Oral Care Completed by SLP: Recent completion by staff Oral Cavity - Dentition: Adequate natural dentition Vision: Functional for self-feeding Self-Feeding Abilities: Able to feed self;Needs assist;Needs set up;Total assist(overall weakness) Patient Positioning: Upright in bed(needed positioning) Baseline Vocal Quality: Normal;Low vocal intensity Volitional Cough: Congested Volitional Swallow: Able to elicit    Oral/Motor/Sensory Function Overall Oral Motor/Sensory Function: Within functional limits(no unilateral weakness noted)   Ice Chips Ice chips: Within functional limits Presentation: Spoon(fed; 2 trials)   Thin Liquid Thin Liquid: Impaired Presentation: Cup;Self Fed;Spoon(supported by SLP; 3 trials via each method) Oral Phase Impairments: (min decreased attention w/ task; verbal cues) Oral Phase Functional Implications: (adequate) Pharyngeal  Phase Impairments: (none) Other Comments: pt declined; did not want further trials    Nectar Thick Nectar Thick Liquid: Not tested   Honey Thick Honey Thick Liquid: Not tested   Puree Puree: Within functional limits Presentation: Self Fed;Spoon(assisted; 6 trials)   Solid     Solid: Impaired Presentation: Spoon(fed; 1 trial) Oral Phase Impairments: (increased oral phase time for full clearing) Oral Phase Functional Implications: Impaired mastication;Prolonged oral transit(min; increased effort overall) Pharyngeal Phase Impairments: (none) Other Comments: slower oral phase management overall       Crystal Kenner, MS, CCC-SLP , 10/23/2018,6:00 PM

## 2018-10-23 NOTE — Care Management Important Message (Signed)
Copy of signed IM left with patient in room.  

## 2018-10-23 NOTE — Progress Notes (Signed)
Patient placed on 35% venturi mask due to oxygen WOB and low oxygen sats.

## 2018-10-24 LAB — CBC
HCT: 42.6 % (ref 36.0–46.0)
Hemoglobin: 13 g/dL (ref 12.0–15.0)
MCH: 30.6 pg (ref 26.0–34.0)
MCHC: 30.5 g/dL (ref 30.0–36.0)
MCV: 100.2 fL — ABNORMAL HIGH (ref 80.0–100.0)
PLATELETS: 139 10*3/uL — AB (ref 150–400)
RBC: 4.25 MIL/uL (ref 3.87–5.11)
RDW: 13.5 % (ref 11.5–15.5)
WBC: 4.7 10*3/uL (ref 4.0–10.5)
nRBC: 0 % (ref 0.0–0.2)

## 2018-10-24 LAB — CREATININE, SERUM
Creatinine, Ser: 0.85 mg/dL (ref 0.44–1.00)
GFR calc Af Amer: >60 mL/min (ref >60)
GFR calc non Af Amer: 59 mL/min — ABNORMAL LOW (ref >60)

## 2018-10-24 LAB — GLUCOSE, CAPILLARY
Glucose-Capillary: 179 mg/dL — ABNORMAL HIGH (ref 70–99)
Glucose-Capillary: 145 mg/dL — ABNORMAL HIGH (ref 70–99)
Glucose-Capillary: 224 mg/dL — ABNORMAL HIGH (ref 70–99)
Glucose-Capillary: 160 mg/dL — ABNORMAL HIGH (ref 70–99)

## 2018-10-24 LAB — PROCALCITONIN: Procalcitonin: <0.1 ng/mL

## 2018-10-24 LAB — URINE CULTURE
Culture: <10000 — AB
Special Requests: NORMAL

## 2018-10-24 MED ORDER — METHYLPREDNISOLONE SODIUM SUCC 125 MG IJ SOLR
60.0000 mg | INTRAMUSCULAR | Status: DC
  Administered 2018-10-25: 60 mg via INTRAVENOUS
  Filled 2018-10-24: qty 2

## 2018-10-24 MED ORDER — SODIUM CHLORIDE 0.9 % IV SOLN
3.0000 g | Freq: Four times a day (QID) | INTRAVENOUS | Status: DC
  Administered 2018-10-24 – 2018-10-26 (×8): 3 g via INTRAVENOUS
  Filled 2018-10-24 (×10): qty 3

## 2018-10-24 NOTE — Care Management (Signed)
Plan for outpatient palliative to follow.  Crystal Delacruz with [facility] is aware of referral.  Patient still requiring acute O2

## 2018-10-24 NOTE — Consult Note (Signed)
Pharmacy Antibiotic Note - follow-up  Crystal Delacruz is a 84 y.o. female admitted on 10/18/2018 with aspiration pneumonia.  Pharmacy has been consulted for Unasyn dosing.  Plan: Continue Unasyn 3g q 6h for aspiration PNA  (D/C'ed Vanc per negative MRSA 12/19)  Height: 5\' 6"  (167.6 cm) Weight: 178 lb (80.7 kg) IBW/kg (Calculated) : 59.3  Temp (24hrs), Avg:98.1 F (36.7 C), Min:97.7 F (36.5 C), Max:98.4 F (36.9 C)  Recent Labs  Lab 10/18/18 1346 10/18/18 1827 10/19/18 0507 10/21/18 0345 10/23/18 1229 10/24/18 0314  WBC 7.1  --  6.6  --   --  4.7  CREATININE 1.40*  --  1.16* 0.87 0.99 0.85  LATICACIDVEN 2.7* 2.0*  --   --   --   --     Estimated Creatinine Clearance: 45.3 mL/min (by C-G formula based on SCr of 0.85 mg/dL).    Allergies  Allergen Reactions  . Latex Swelling  . Sulfur     Antimicrobials this admission:  CTX 12/14 >> 12/17 Cefdinir 12/18>>12/19 Azithromycin 12/16>>12/18 Unasyn 12/19>> Vancomycin 12/19>>12/20  Dose adjustments this admission: N/A  Microbiology results: Resp Panel 12/16>>Metapneumovirus detected UCx 12/16 >> Less than 10k colonies - Insignificant growth final UCx 12/18 >> pending  MRSA PCR Neg 12/19  Thank you for allowing pharmacy to be a part of this patient's care.  Lu Duffel, PharmD, BCPS Clinical Pharmacist 10/24/2018 11:02 AM

## 2018-10-24 NOTE — Treatment Plan (Signed)
Patient appears to be markedly improved from today.  Continue nebulization treatments, pulmonary toilet and oxygen supplementation.  Wean off oxygen for saturations of 92% or better.  Patient was started on Unasyn yesterday.  Chest CT was consistent with pneumonia.  She has had viral infection.  Though pro calcitonin was not elevated this does not exclude focal bacterial infection.  Patient's advanced age may not allow her to mount a significant white count response.  My recommendation would be to cover for 7 days of therapy.  Will sign off please reconsult as needed.

## 2018-10-24 NOTE — Progress Notes (Signed)
[facility] [place] NAME: Crystal Delacruz    MR#:  [id number]  DATE OF BIRTH:  1924/10/25  SUBJECTIVE:   Patient is still on 2-3  L of oxygen , wheezing and shortness of breath are better.   She is less ambulatory at her baseline .  Husband and daughter-in-law at bedside  REVIEW OF SYSTEMS:    Review of Systems  Constitutional: Negative for fever, chills weight loss HENT: Negative for ear pain, nosebleeds, congestion, facial swelling, rhinorrhea, neck pain, neck stiffness and ear discharge.   Respiratory: Reporting chest tightness and shortness of breath getting worse, wheezing  Cardiovascular: Negative for chest pain, palpitations and leg swelling.  Gastrointestinal: Negative for heartburn, abdominal pain, vomiting, diarrhea or consitpation Genitourinary: +=dysuria,no urgency, frequency, hematuria Musculoskeletal: Negative for back pain or joint pain Neurological: Negative for dizziness, seizures, syncope, focal weakness,  numbness and headaches.  Hematological: Does not bruise/bleed easily.  Psychiatric/Behavioral: Negative for hallucinations, confusion, dysphoric mood    Tolerating Diet:yes      DRUG ALLERGIES:   Allergies  Allergen Reactions  . Latex Swelling  . Sulfur     VITALS:  Blood pressure 126/62, pulse 64, temperature 98.4 F (36.9 C), resp. rate 20, height 5\' 6"  (1.676 m), weight 80.7 kg, SpO2 99 %.  PHYSICAL EXAMINATION:  Constitutional: Appears well-developed and well-nourished. No distress. HENT: Normocephalic. Marland Kitchen Oropharynx is clear and moist.  Eyes: Conjunctivae and EOM are normal. PERRLA, no scleral icterus.  Neck: Normal ROM. Neck supple. No JVD. No tracheal deviation. CVS: RRR, S1/S2 +, no murmurs, no gallops, no carotid bruit.  Pulmonary: Moderate coarse bronchial breath sounds, no stridor, rhonchi, diffuse wheezes, no rales.  Abdominal: Soft. BS +,  no distension, tenderness, rebound or guarding.   Musculoskeletal: Normal range of motion. No edema and no tenderness.  Neuro: Alert.  Awake, alert and oriented x3. No focal deficits. Skin: Skin is warm and dry. No rash noted. Psychiatric: Normal mood and affect.      LABORATORY PANEL:   CBC Recent Labs  Lab 10/24/18 0314  WBC 4.7  HGB 13.0  HCT 42.6  PLT 139*   ------------------------------------------------------------------------------------------------------------------  Chemistries  Recent Labs  Lab 10/21/18 0345  10/24/18 0314  NA 138  --   --   K 4.1  --   --   CL 101  --   --   CO2 29  --   --   GLUCOSE 208*  --   --   BUN 20  --   --   CREATININE 0.87   < > 0.85  CALCIUM 8.4*  --   --    < > = values in this interval not displayed.   ------------------------------------------------------------------------------------------------------------------  Cardiac Enzymes Recent Labs  Lab 10/18/18 1346  TROPONINI <0.03   ------------------------------------------------------------------------------------------------------------------  RADIOLOGY:  Ct Chest W Contrast  Result Date: 10/23/2018 CLINICAL DATA:  Acute hypoxic respiratory failure, shortness of breath EXAM: CT CHEST WITH CONTRAST TECHNIQUE: Multidetector CT imaging of the chest was performed during intravenous contrast administration. CONTRAST:  81mL OMNIPAQUE IOHEXOL 300 MG/ML  SOLN COMPARISON:  Chest x-ray of 10/21/2018 FINDINGS: Cardiovascular: There is good opacification of the thoracic aorta and significant thoracic aortic atherosclerosis is present. The mid ascending thoracic aorta measures 43 mm in diameter indicative of fusiform aneurysmal dilatation. Recommend annual imaging followup by CTA or MRA. This recommendation follows 2010 ACCF/AHA/AATS/ACR/ASA/SCA/SCAI/SIR/STS/SVM Guidelines for the Diagnosis and Management of Patients with Thoracic Aortic Disease. Circulation. 2010; 121: N170-Y174.  The heart is minimally enlarged and no pericardial  effusion is seen. Coronary artery calcifications are noted diffusely. There is good opacification of the pulmonary arteries which appears slightly prominent which may indicate a degree of pulmonary arterial hypertension. No embolus is evident. Mediastinum/Nodes: There is mild left hilar and mediastinal adenopathy. Within the precarinal mediastinum there is a 16 mm node on image 48 series 2. Within the left hilum there is a node of 9 mm on image 63. No significant adenopathy is seen. The thyroid gland is well visualized and there is a 9 mm low-attenuation nodule in the left lobe of thyroid. In this age patient this finding is of doubtful clinical significance. Also there is some calcification within the left lobe of thyroid as well. There is significant degenerative change at the sternoclavicular joints bilaterally. Lungs/Pleura: On lung window images, there are parenchymal opacities within the posterior inferior right upper lobe, right middle lobe, right lower lobe posteriorly, left lower lobe particularly this appears segment, in the inferior aspect of the left upper lobe indicative of multifocal pneumonia. Air bronchograms course through several of these opacities. There may be very mild bronchiectatic change present particularly in the right lower lobe posteriorly. No pleural effusion is seen. No suspicious lung nodule is evident. Upper Abdomen: The portion of the upper abdomen that is visualized and this study is unremarkable. Musculoskeletal: There is a thoracic scoliosis convex the left with diffuse degenerative change throughout the thoracic spine. No compression deformity is seen. IMPRESSION: 1. No acute abnormality of the thoracic aorta and no pulmonary embolism is seen. 2. However there are patchy opacities throughout both lungs consistent with multifocal pneumonia. Mild bronchiectatic change is present in the right lower lobe posteriorly. 3. Fusiform dilatation of the mid ascending thoracic aorta  measuring 4.3 cm. Recommend annual imaging followup by CTA or MRA. This recommendation follows 2010 ACCF/AHA/AATS/ACR/ASA/SCA/SCAI/SIR/STS/SVM Guidelines for the Diagnosis and Management of Patients with Thoracic Aortic Disease. Circulation. 2010; 121: T035-W656. 4. Thoracic scoliosis with diffuse degenerative change. Electronically Signed   By: Ivar Drape M.D.   On: 10/23/2018 11:06     ASSESSMENT AND PLAN:   84 year old female with history of diabetes and hypothyroidism who presented with generalized weakness and dysuria.   1.  Acute hypoxic respiratory failure -acute bronchitis , metapneumo virus Given the new diagnosis of aspiration pneumonia on 10/23/2018 CT chest-patchy opacities with multifocal pneumonia no pulmonary embolism Pulmonology and ID consulted, appreciate the recommendations   IV Solu-Medrol tapering as patient is clinically better today  Rocephin and azithromycin changed to Unasyn p Wean oxygen as tolerated  2.  Acute kidney injury in the setting of UTI and dehydration which is improved  3.  UTI: Continue Rocephin and follow-up on urine culture, ordered on 10/20/2018 does not seem to be done until 10/22/2018 Urine culture is pending  4.  Essential hypertension: Continue metoprolol  5.  Hypothyroidism: Continue Synthroid  Generalized weakness refusing skilled nursing facility and requesting home health physical therapy.  Will discharge patient home with home health as per patient and family request   Therapy is recommended skilled nursing facility upon discharge.  Clinical social worker consult placed. Management plans discussed with the patient and patient's husband Agreeable with the plan of care CODE STATUS: full  TOTAL TIME TAKING CARE OF THIS PATIENT: 33 minutes.     POSSIBLE D/C tomorrow, DEPENDING ON CLINICAL CONDITION.   Crystal Delacruz M.D on 10/24/2018 at 4:02 PM  Between 7am to 6pm - Pager - [phone] After 6pm  go to [email or web address] - password  [facility]  Office  [phone]  CC: Primary care physician; Kirk Ruths, MD  Note: This dictation was prepared with Dragon dictation along with smaller phrase technology. Any transcriptional errors that result from this process are unintentional.

## 2018-10-24 NOTE — Progress Notes (Signed)
Patient assessed this morning per RN request. Patient is resting comfortably on nasal cannula. Patient has some mild left upper lobe rhonci. She already has flutter and incentive devices at bedside that due to weakness, she has not been able to use effectively. Patient has scheduled svn tx. Would benefit from good cough techniques.

## 2018-10-24 NOTE — Progress Notes (Signed)
Physical Therapy Treatment Patient Details Name: Crystal Delacruz MRN: [id number] DOB: 04-Apr-1924 Today's Date: 10/24/2018    History of Present Illness Patient is a 84 year old female admitted for UTI and hypoxia with c/o generalized weakness.  PMH includes DM, hypothyroid and Htn.    PT Comments    Pt pleasant but somewhat pensive about working with PT, but family assisted in encouraging her and she ultimately did quite well.  She was abe to do multiple sit to stands with min assist along with pivot transfers.  She was able to ambulate ~7 ft with walker and only CGA and participated well with exercises.  Initially pt's O2 was in the mid 80s on 2 liters, but with purposeful breathing and activity it came up to the 90s and remained in low 90s t/o most of the session.     Follow Up Recommendations  SNF     Equipment Recommendations  Rolling walker with 5" wheels    Recommendations for Other Services       Precautions / Restrictions Precautions Precautions: Fall Precaution Comments: High fall risk Restrictions Weight Bearing Restrictions: No    Mobility  Bed Mobility Overal bed mobility: Needs Assistance Bed Mobility: Supine to Sit     Supine to sit: Mod assist     General bed mobility comments: Pt did need assist to get to sitting EOB this date, showed some effort but unable to initiate w/o assist with LE and holding hand  Transfers Overall transfer level: Needs assistance Equipment used: Rolling walker (2 wheeled) Transfers: Sit to/from Stand Sit to Stand: Min assist         General transfer comment: 4 X sit to stand with light assist to initiate movement and shift weight forward  Ambulation/Gait Ambulation/Gait assistance: Min guard Gait Distance (Feet): 7 Feet Assistive device: Rolling walker (2 wheeled)       General Gait Details: Pt able to perform 3 shuffling pivot transfers and one bout of ambulation about 7 ft. Pt with some anxiety t/o each attempt, but  actually did relatively well.   Stairs             Wheelchair Mobility    Modified Rankin (Stroke Patients Only)       Balance Overall balance assessment: Needs assistance Sitting-balance support: No upper extremity supported;Feet supported Sitting balance-Leahy Scale: Good     Standing balance support: Bilateral upper extremity supported Standing balance-Leahy Scale: Fair Standing balance comment: some anxiety and very cautious, but able to maintain standing with walker                            Cognition Arousal/Alertness: Awake/alert Behavior During Therapy: Anxious Overall Cognitive Status: History of cognitive impairments - at baseline                                        Exercises General Exercises - Lower Extremity Ankle Circles/Pumps: Strengthening;10 reps;Both Short Arc Quad: Strengthening;10 reps;Both Heel Slides: Strengthening;10 reps;Both Hip ABduction/ADduction: Strengthening;10 reps;Both    General Comments        Pertinent Vitals/Pain Pain Assessment: No/denies pain    Home Living                      Prior Function            PT Goals (current  goals can now be found in the care plan section) Progress towards PT goals: Progressing toward goals    Frequency    Min 2X/week      PT Plan Current plan remains appropriate    Co-evaluation              AM-PAC PT "6 Clicks" Mobility   Outcome Measure  Help needed turning from your back to your side while in a flat bed without using bedrails?: A Little Help needed moving from lying on your back to sitting on the side of a flat bed without using bedrails?: A Lot Help needed moving to and from a bed to a chair (including a wheelchair)?: A Little Help needed standing up from a chair using your arms (e.g., wheelchair or bedside chair)?: A Little Help needed to walk in hospital room?: A Lot Help needed climbing 3-5 steps with a railing? :  Total 6 Click Score: 14    End of Session Equipment Utilized During Treatment: Gait belt;Oxygen Activity Tolerance: Patient limited by fatigue;Patient tolerated treatment well Patient left: in chair;with call bell/phone within reach;with chair alarm set;with family/visitor present;with nursing/sitter in room Nurse Communication: Mobility status;Precautions PT Visit Diagnosis: Unsteadiness on feet (R26.81);Other abnormalities of gait and mobility (R26.89);Muscle weakness (generalized) (M62.81)     Time: 1829-9371 PT Time Calculation (min) (ACUTE ONLY): 58 min  Charges:  $Gait Training: 8-22 mins $Therapeutic Exercise: 8-22 mins $Therapeutic Activity: 23-37 mins                     Kreg Shropshire, DPT 10/24/2018, 5:08 PM

## 2018-10-24 NOTE — Progress Notes (Signed)
[facility] INFECTIOUS DISEASE PROGRESS NOTE Date of Admission:  10/18/2018     ID: Crystal Delacruz is a 84 y.o. female with viral pneumonia Active Problems:   UTI (urinary tract infection)   Subjective: No fevers, coughing improving, on nebs and steroids. Much more alert and interactive  ROS  Eleven systems are reviewed and negative except per hpi  Medications:  Antibiotics Given (last 72 hours)    Date/Time Action Medication Dose Rate   10/21/18 1527 Given   azithromycin (ZITHROMAX) tablet 500 mg 500 mg    10/21/18 1711 New Bag/Given   cefTRIAXone (ROCEPHIN) 1 g in sodium chloride 0.9 % 100 mL IVPB 1 g 200 mL/hr   10/22/18 0925 Given   cefdinir (OMNICEF) capsule 300 mg 300 mg    10/22/18 1517 Given   azithromycin (ZITHROMAX) tablet 500 mg 500 mg    10/22/18 2104 Given   cefdinir (OMNICEF) capsule 300 mg 300 mg    10/23/18 1127 Given  [scanner not charging, service will be requested]   cefdinir (OMNICEF) capsule 300 mg 300 mg    10/23/18 1434 New Bag/Given   Ampicillin-Sulbactam (UNASYN) 3 g in sodium chloride 0.9 % 100 mL IVPB 3 g 200 mL/hr   10/23/18 1521 New Bag/Given   vancomycin (VANCOCIN) 1,250 mg in sodium chloride 0.9 % 250 mL IVPB 1,250 mg 166.7 mL/hr   10/23/18 1915 New Bag/Given   Ampicillin-Sulbactam (UNASYN) 3 g in sodium chloride 0.9 % 100 mL IVPB 3 g 200 mL/hr   10/24/18 0057 New Bag/Given   Ampicillin-Sulbactam (UNASYN) 3 g in sodium chloride 0.9 % 100 mL IVPB 3 g 200 mL/hr   10/24/18 0618 New Bag/Given   Ampicillin-Sulbactam (UNASYN) 3 g in sodium chloride 0.9 % 100 mL IVPB 3 g 200 mL/hr     . albuterol  2.5 mg Nebulization Q4H while awake  . aspirin EC  81 mg Oral Daily  . benzonatate  200 mg Oral TID  . enoxaparin (LOVENOX) injection  40 mg Subcutaneous Q24H  . gabapentin  100 mg Oral BID  . insulin aspart  0-15 Units Subcutaneous TID WC  . insulin aspart  0-5 Units Subcutaneous QHS  . insulin aspart  3 Units Subcutaneous TID WC  . levothyroxine   50 mcg Oral QAC breakfast  . methylPREDNISolone (SOLU-MEDROL) injection  40 mg Intravenous Q12H  . metoprolol tartrate  50 mg Oral BID  . nortriptyline  25 mg Oral BID  . polyethylene glycol  17 g Oral Daily  . senna-docusate  1 tablet Oral QHS  . sodium chloride flush  3 mL Intravenous Q12H    Objective: Vital signs in last 24 hours: Temp:  [97.7 F (36.5 C)-98.4 F (36.9 C)] 98.4 F (36.9 C) (12/20 1132) Pulse Rate:  [64-78] 64 (12/20 1132) Resp:  [18-20] 20 (12/20 0456) BP: (126-145)/(62-74) 126/62 (12/20 1132) SpO2:  [90 %-99 %] 99 % (12/20 1132) Constitutional:  Frail, breathing comfortably on 2 L O2 , awake and alert  HENT: [place]/AT, PERRLA, no scleral icterus Mouth/Throat: Oropharynx is clear but dry. No oropharyngeal exudate.  Cardiovascular: Normal rate, regular rhythm 2/6 sm Pulmonary/Chest: decent air movement, less rhonchi, less wheeze Neck = supple, no nuchal rigidity Abdominal: Soft. Bowel sounds are normal.  exhibits no distension. There is no tenderness.  Lymphadenopathy: no cervical adenopathy. No axillary adenopathy Neurological: alert and oriented to person, place, and time.  Skin: Skin is warm and dry. No rash noted. No erythema.  Psychiatric: a normal mood and affect.  behavior is normal.   Lab Results Recent Labs    10/23/18 1229 10/24/18 0314  WBC  --  4.7  HGB  --  13.0  HCT  --  42.6  CREATININE 0.99 0.85    Microbiology: Results for orders placed or performed during the hospital encounter of 10/18/18  Respiratory Panel by PCR     Status: Abnormal   Collection Time: 10/20/18  4:13 PM  Result Value Ref Range Status   Adenovirus NOT DETECTED NOT DETECTED Final   Coronavirus 229E NOT DETECTED NOT DETECTED Final   Coronavirus HKU1 NOT DETECTED NOT DETECTED Final   Coronavirus NL63 NOT DETECTED NOT DETECTED Final   Coronavirus OC43 NOT DETECTED NOT DETECTED Final   Metapneumovirus DETECTED (A) NOT DETECTED Final   Rhinovirus / Enterovirus NOT  DETECTED NOT DETECTED Final   Influenza A NOT DETECTED NOT DETECTED Final   Influenza B NOT DETECTED NOT DETECTED Final   Parainfluenza Virus 1 NOT DETECTED NOT DETECTED Final   Parainfluenza Virus 2 NOT DETECTED NOT DETECTED Final   Parainfluenza Virus 3 NOT DETECTED NOT DETECTED Final   Parainfluenza Virus 4 NOT DETECTED NOT DETECTED Final   Respiratory Syncytial Virus NOT DETECTED NOT DETECTED Final   Bordetella pertussis NOT DETECTED NOT DETECTED Final   Chlamydophila pneumoniae NOT DETECTED NOT DETECTED Final   Mycoplasma pneumoniae NOT DETECTED NOT DETECTED Final    Comment: Performed at [facility], [place] [address] [place], [place] [address]  Urine Culture     Status: Abnormal   Collection Time: 10/20/18  4:20 PM  Result Value Ref Range Status   Specimen Description   Final    URINE, RANDOM Performed at [facility], [address] [place], [place] [address]    Special Requests   Final    Normal Performed at [facility], [address] [place], [place] [address]    Culture (A)  Final    <10,000 COLONIES/mL INSIGNIFICANT GROWTH Performed at [facility], [place] [address] [place], [place] [address]    Report Status 10/24/2018 FINAL  Final  MRSA PCR Screening     Status: None   Collection Time: 10/23/18  2:41 PM  Result Value Ref Range Status   MRSA by PCR NEGATIVE NEGATIVE Final    Comment:        The GeneXpert MRSA Assay (FDA approved for NASAL specimens only), is one component of a comprehensive MRSA colonization surveillance program. It is not intended to diagnose MRSA infection nor to guide or monitor treatment for MRSA infections. Performed at [facility], [address] [place], [place] [address]     Studies/Results: Ct Chest W Contrast  Result Date: 10/23/2018 CLINICAL DATA:  Acute hypoxic respiratory failure, shortness of breath EXAM: CT CHEST WITH CONTRAST TECHNIQUE: Multidetector CT imaging of the chest  was performed during intravenous contrast administration. CONTRAST:  37mL OMNIPAQUE IOHEXOL 300 MG/ML  SOLN COMPARISON:  Chest x-ray of 10/21/2018 FINDINGS: Cardiovascular: There is good opacification of the thoracic aorta and significant thoracic aortic atherosclerosis is present. The mid ascending thoracic aorta measures 43 mm in diameter indicative of fusiform aneurysmal dilatation. Recommend annual imaging followup by CTA or MRA. This recommendation follows 2010 ACCF/AHA/AATS/ACR/ASA/SCA/SCAI/SIR/STS/SVM Guidelines for the Diagnosis and Management of Patients with Thoracic Aortic Disease. Circulation. 2010; 121: M754-G920. The heart is minimally enlarged and no pericardial effusion is seen. Coronary artery calcifications are noted diffusely. There is good opacification of the pulmonary arteries which appears slightly prominent  which may indicate a degree of pulmonary arterial hypertension. No embolus is evident. Mediastinum/Nodes: There is mild left hilar and mediastinal adenopathy. Within the precarinal mediastinum there is a 16 mm node on image 48 series 2. Within the left hilum there is a node of 9 mm on image 63. No significant adenopathy is seen. The thyroid gland is well visualized and there is a 9 mm low-attenuation nodule in the left lobe of thyroid. In this age patient this finding is of doubtful clinical significance. Also there is some calcification within the left lobe of thyroid as well. There is significant degenerative change at the sternoclavicular joints bilaterally. Lungs/Pleura: On lung window images, there are parenchymal opacities within the posterior inferior right upper lobe, right middle lobe, right lower lobe posteriorly, left lower lobe particularly this appears segment, in the inferior aspect of the left upper lobe indicative of multifocal pneumonia. Air bronchograms course through several of these opacities. There may be very mild bronchiectatic change present particularly in the  right lower lobe posteriorly. No pleural effusion is seen. No suspicious lung nodule is evident. Upper Abdomen: The portion of the upper abdomen that is visualized and this study is unremarkable. Musculoskeletal: There is a thoracic scoliosis convex the left with diffuse degenerative change throughout the thoracic spine. No compression deformity is seen. IMPRESSION: 1. No acute abnormality of the thoracic aorta and no pulmonary embolism is seen. 2. However there are patchy opacities throughout both lungs consistent with multifocal pneumonia. Mild bronchiectatic change is present in the right lower lobe posteriorly. 3. Fusiform dilatation of the mid ascending thoracic aorta measuring 4.3 cm. Recommend annual imaging followup by CTA or MRA. This recommendation follows 2010 ACCF/AHA/AATS/ACR/ASA/SCA/SCAI/SIR/STS/SVM Guidelines for the Diagnosis and Management of Patients with Thoracic Aortic Disease. Circulation. 2010; 121: M426-S341. 4. Thoracic scoliosis with diffuse degenerative change. Electronically Signed   By: Ivar Drape M.D.   On: 10/23/2018 11:06    Assessment/Plan: Crystal Delacruz is a 84 y.o. female admitted with weakness and dysuria and found to have urinary tract infection with urine cultures pending as well as respiratory infection with metapneumovirus.  She has had progressive changes on her CXR and CT showing multifocal infiltrates and bronchiectasis.  Respiratory viral panel is positive for metapneumovirus but negative for influenza.  She was on azithromycin and ceftriaxone.  Her family members all report that they have had a similar illness ongoing for the last 1 to 2 weeks. There is some concern for aspiration.  She has been seen by speech and swallow currently.   I do think her pneumonia is likely from metapneumovirus directly given her advanced age which is a risk for metapneumovirus pneumonia however it is difficult to sort out whether she could have bacterial superinfection or aspiration  pneumonia given her imaging findings.   12.20 - remains on unasyn, breathing much better, no fevers. PC < 0.1, MRSA PCR neg.  Recommendations Since no evidence bacterial PNA (normal procalcitonin, normal wbc, no fevers) and + resp PCR for metapneumovirus I think we can safely stop antibiotics and monitor.  She has already received a 7 day course of coverage anyway.  Continue ymptomatic management with steroids, nebs.  Would use cough suppressants liberally.   I will sign off but please call with questions. Thank you very much for the consult.  Leonel Ramsay   10/24/2018, 1:26 PM

## 2018-10-24 NOTE — Progress Notes (Signed)
Daily Progress Note   Patient Name: Crystal Delacruz       Date: 10/24/2018 DOB: Mar 27, 1924  Age: 84 y.o. MRN#: [id number] Attending Physician: Nicholes Mango, MD Primary Care Physician: [place] Ruths, MD Admit Date: 10/18/2018  Reason for Consultation/Follow-up: Establishing goals of care  Subjective: Patient is more awake and alert today. She states "I feel much better today and my goal is to keep going and hopefully live longer!" support given. Husband and daughter at bedside. Patient reports she is hungry and waiting for lunch tray to be delivered. Patient states she is ready to get home and get to her recliner.   Husband reports he is excited that she is feeling better today. Family reports they feels she was going through a rough patch due to entire family who have been sick and dealing with upper respiratory illnesses. They remain hopeful she will continue to improve and get home soon. They verbalized plans remain to have outpatient Palliative.   Length of Stay: 4  Current Medications: Scheduled Meds:  . albuterol  2.5 mg Nebulization Q4H while awake  . aspirin EC  81 mg Oral Daily  . benzonatate  200 mg Oral TID  . enoxaparin (LOVENOX) injection  40 mg Subcutaneous Q24H  . gabapentin  100 mg Oral BID  . insulin aspart  0-15 Units Subcutaneous TID WC  . insulin aspart  0-5 Units Subcutaneous QHS  . insulin aspart  3 Units Subcutaneous TID WC  . levothyroxine  50 mcg Oral QAC breakfast  . methylPREDNISolone (SOLU-MEDROL) injection  40 mg Intravenous Q12H  . metoprolol tartrate  50 mg Oral BID  . nortriptyline  25 mg Oral BID  . polyethylene glycol  17 g Oral Daily  . senna-docusate  1 tablet Oral QHS  . sodium chloride flush  3 mL Intravenous Q12H    Continuous Infusions: .  sodium chloride 250 mL (10/23/18 1915)    PRN Meds: sodium chloride, acetaminophen **OR** acetaminophen, albuterol, bisacodyl, ondansetron **OR** ondansetron (ZOFRAN) IV, sodium chloride flush, sodium phosphate, traMADol  Physical Exam Vitals signs and nursing note reviewed.  Constitutional:      General: She is awake.  Cardiovascular:     Rate and Rhythm: Normal rate.     Pulses: Normal pulses.     Heart sounds: Normal heart sounds.  Pulmonary:     Effort: Pulmonary effort is normal.     Breath sounds: Transmitted upper airway sounds present. Decreased breath sounds present.  Skin:    General: Skin is warm and dry.     Findings: Bruising present.  Neurological:     Mental Status: She is alert and oriented to person, place, and time.  Psychiatric:        Behavior: Behavior is cooperative.        Cognition and Memory: Cognition normal.        Judgment: Judgment normal.             Vital Signs: BP 126/62 (BP Location: Left Arm)   Pulse 64   Temp 98.4 F (36.9 C)   Resp 20   Ht 5\' 6"  (1.676 m)   Wt 80.7 kg   SpO2 99%   BMI 28.73 kg/m  SpO2: SpO2: 99 % O2 Device: O2 Device: Nasal Cannula O2 Flow Rate: O2 Flow Rate (L/min): 2 L/min  Intake/output summary:   Intake/Output Summary (Last 24 hours) at 10/24/2018 1455 Last data filed at 10/24/2018 1451 Gross per 24 hour  Intake 450 ml  Output 950 ml  Net -500 ml   LBM: Last BM Date: 10/22/18 Baseline Weight: Weight: 80.7 kg Most recent weight: Weight: 80.7 kg       Palliative Assessment/Data:PPS 30%   Flowsheet Rows     Most Recent Value  Intake Tab  Referral Department  Hospitalist  Unit at Time of Referral  Med/Surg Unit  Date Notified  10/23/18  Palliative Care Type  New Palliative care  Reason for referral  Clarify Goals of Care  Date of Admission  10/20/18  Date first seen by Palliative Care  10/23/18  # of days Palliative referral response time  0 Day(s)  # of days IP prior to Palliative referral  3    Clinical Assessment  Psychosocial & Spiritual Assessment  Palliative Care Outcomes      Patient Active Problem List   Diagnosis Date Noted  . UTI (urinary tract infection) 10/18/2018    Palliative Care Assessment & Plan   Patient Profile: 84 y.o. female admitted on 10/18/2018 from home with complaints of generalized weakness and cough. She has a past medical history of diabetes, hypertension, and hypothyroidism. During her ED course she complained of dysuria and foul-smelling urine with a low-grade fever. Patient appeared dry and dehydrated. Her UA was positive for UTI. She was started on IV Rocephin post cultures and started on IV fluids. She was initiated on supplemental oxygen via nasal cannula due to decreased oxygen saturations. Chest xray negative for pneumonia. Since admission patient continues on nasal cannula. She continues on Rocephin for her UTI and home medications. Palliative Medicine team consulted for goals of care.   Recommendations/Plan:  FULL CODE-patient/family confirmed   Continue to treat the treatable  Family request outpatient palliative services  Case Management referral for outpatient palliative  PMT will continue to follow and support.   Goals of Care and Additional Recommendations:  Limitations on Scope of Treatment: Full Scope Treatment  Code Status:    Code Status Orders  (From admission, onward)         Start     Ordered   10/18/18 1825  Full code  Continuous     10/18/18 1824        Code Status History    This patient has a current code status but no historical code status.  Prognosis:   Guarded to Poor   Discharge Planning:  Home with Palliative Services  Care plan was discussed with patient, patient's family, and bedside RN.   Thank you for allowing the Palliative Medicine Team to assist in the care of this patient.   Total Time 35 min.  Prolonged Time Billed  NO       Greater than 50%  of this time was spent  counseling and coordinating care related to the above assessment and plan.  Alda Lea, AGPCNP-BC Palliative Medicine Team  Pager: [phone] Amion: Bjorn Pippin   Please contact Palliative Medicine Team phone at [phone] for questions and concerns.

## 2018-10-24 NOTE — Consult Note (Signed)
Pharmacy Antibiotic Note  Crystal Delacruz is a 84 y.o. female admitted on 10/18/2018 with a UTI now with aspiration  Pneumonia. Pharmacy has been consulted for Unasyn dosing. She is being followed by infectious disease.  Plan: Start Unasyn 3 grams IV every 6 hours  Height: 5\' 6"  (167.6 cm) Weight: 178 lb (80.7 kg) IBW/kg (Calculated) : 59.3  Temp (24hrs), Avg:98.2 F (36.8 C), Min:97.7 F (36.5 C), Max:98.4 F (36.9 C)  Recent Labs  Lab 10/18/18 1346 10/18/18 1827 10/19/18 0507 10/21/18 0345 10/23/18 1229 10/24/18 0314  WBC 7.1  --  6.6  --   --  4.7  CREATININE 1.40*  --  1.16* 0.87 0.99 0.85  LATICACIDVEN 2.7* 2.0*  --   --   --   --     Estimated Creatinine Clearance: 45.3 mL/min (by C-G formula based on SCr of 0.85 mg/dL).    Allergies  Allergen Reactions  . Latex Swelling  . Sulfur     Antimicrobials this admission: Unasyn 12/19 >>  Azithromycin  12/16 >> 12/18 Ceftriaxone 12/14 >> 12/18 Cefdinir 12/18 >> 12/19  Microbiology results:         Results for orders placed or performed during the hospital encounter of 10/18/18  Respiratory Panel by PCR     Status: Abnormal   Collection Time: 10/20/18  4:13 PM  Result Value Ref Range Status   Adenovirus NOT DETECTED NOT DETECTED Final   Coronavirus 229E NOT DETECTED NOT DETECTED Final   Coronavirus HKU1 NOT DETECTED NOT DETECTED Final   Coronavirus NL63 NOT DETECTED NOT DETECTED Final   Coronavirus OC43 NOT DETECTED NOT DETECTED Final   Metapneumovirus DETECTED (A) NOT DETECTED Final   Rhinovirus / Enterovirus NOT DETECTED NOT DETECTED Final   Influenza A NOT DETECTED NOT DETECTED Final   Influenza B NOT DETECTED NOT DETECTED Final   Parainfluenza Virus 1 NOT DETECTED NOT DETECTED Final   Parainfluenza Virus 2 NOT DETECTED NOT DETECTED Final   Parainfluenza Virus 3 NOT DETECTED NOT DETECTED Final   Parainfluenza Virus 4 NOT DETECTED NOT DETECTED Final   Respiratory Syncytial Virus NOT DETECTED  NOT DETECTED Final   Bordetella pertussis NOT DETECTED NOT DETECTED Final   Chlamydophila pneumoniae NOT DETECTED NOT DETECTED Final   Mycoplasma pneumoniae NOT DETECTED NOT DETECTED Final    Comment: Performed at [facility], [place] [address], [place], [place] [address]  Urine Culture     Status: Abnormal   Collection Time: 10/20/18  4:20 PM  Result Value Ref Range Status   Specimen Description   Final    URINE, RANDOM Performed at [facility], [address], [place], [place] [address]    Special Requests   Final    Normal Performed at [facility], [address] [place], [place] [address]    Culture (A)  Final    <10,000 COLONIES/mL INSIGNIFICANT GROWTH Performed at [facility], [place] [address] [place], [place] [address]    Report Status 10/24/2018 FINAL  Final  MRSA PCR Screening     Status: None   Collection Time: 10/23/18  2:41 PM  Result Value Ref Range Status   MRSA by PCR NEGATIVE NEGATIVE Final    Comment:        The GeneXpert MRSA Assay (FDA approved for NASAL specimens only), is one component of a comprehensive MRSA colonization surveillance program. It is not intended to diagnose MRSA infection nor to guide or monitor treatment for MRSA infections. Performed  at [facility], [address], [place], [place] [address]     Thank you for allowing pharmacy to be a part of this patient's care.  Dallie Piles, PharmD 10/24/2018 4:33 PM

## 2018-10-25 LAB — GLUCOSE, CAPILLARY
Glucose-Capillary: 148 mg/dL — ABNORMAL HIGH (ref 70–99)
Glucose-Capillary: 183 mg/dL — ABNORMAL HIGH (ref 70–99)
Glucose-Capillary: 288 mg/dL — ABNORMAL HIGH (ref 70–99)
Glucose-Capillary: 212 mg/dL — ABNORMAL HIGH (ref 70–99)

## 2018-10-25 LAB — CREATININE, SERUM
Creatinine, Ser: 0.83 mg/dL (ref 0.44–1.00)
GFR calc Af Amer: >60 mL/min (ref >60)
GFR calc non Af Amer: >60 mL/min (ref >60)

## 2018-10-25 LAB — PROCALCITONIN: Procalcitonin: <0.1 ng/mL

## 2018-10-25 MED ORDER — PREDNISONE 50 MG PO TABS
50.0000 mg | ORAL_TABLET | Freq: Every day | ORAL | Status: DC
  Administered 2018-10-26 – 2018-10-27 (×2): 50 mg via ORAL
  Filled 2018-10-25 (×2): qty 1

## 2018-10-25 NOTE — Progress Notes (Signed)
[place] [facility] [place] NAME: Crystal Delacruz    MR#:  [id number]  DATE OF BIRTH:  1924-04-07  SUBJECTIVE:   Patient is feeling better today on 2  L of oxygen  she is less ambulatory at her baseline .  Husband and daughter-in-law at bedside  REVIEW OF SYSTEMS:    Review of Systems  Constitutional: Negative for fever, chills weight loss HENT: Negative for ear pain, nosebleeds, congestion, facial swelling, rhinorrhea, neck pain, neck stiffness and ear discharge.   Respiratory: Reporting chest tightness and shortness of breath getting worse, wheezing  Cardiovascular: Negative for chest pain, palpitations and leg swelling.  Gastrointestinal: Negative for heartburn, abdominal pain, vomiting, diarrhea or consitpation Genitourinary: +=dysuria,no urgency, frequency, hematuria Musculoskeletal: Negative for back pain or joint pain Neurological: Negative for dizziness, seizures, syncope, focal weakness,  numbness and headaches.  Hematological: Does not bruise/bleed easily.  Psychiatric/Behavioral: Negative for hallucinations, confusion, dysphoric mood    Tolerating Diet:yes      DRUG ALLERGIES:   Allergies  Allergen Reactions  . Latex Swelling  . Sulfur     VITALS:  Blood pressure (!) 164/75, pulse 68, temperature (!) 97.5 F (36.4 C), temperature source Oral, resp. rate 18, height 5\' 6"  (1.676 m), weight 80.7 kg, SpO2 94 %.  PHYSICAL EXAMINATION:  Constitutional: Appears well-developed and well-nourished. No distress. HENT: Normocephalic. Marland Kitchen Oropharynx is clear and moist.  Eyes: Conjunctivae and EOM are normal. PERRLA, no scleral icterus.  Neck: Normal ROM. Neck supple. No JVD. No tracheal deviation. CVS: RRR, S1/S2 +, no murmurs, no gallops, no carotid bruit.  Pulmonary: Moderate coarse bronchial breath sounds, no stridor, rhonchi, diffuse wheezes, no rales.  Abdominal: Soft. BS +,  no distension, tenderness, rebound or guarding.   Musculoskeletal: Normal range of motion. No edema and no tenderness.  Neuro: Alert.  Awake, alert and oriented x3. No focal deficits. Skin: Skin is warm and dry. No rash noted. Psychiatric: Normal mood and affect.      LABORATORY PANEL:   CBC Recent Labs  Lab 10/24/18 0314  WBC 4.7  HGB 13.0  HCT 42.6  PLT 139*   ------------------------------------------------------------------------------------------------------------------  Chemistries  Recent Labs  Lab 10/21/18 0345  10/25/18 0443  NA 138  --   --   K 4.1  --   --   CL 101  --   --   CO2 29  --   --   GLUCOSE 208*  --   --   BUN 20  --   --   CREATININE 0.87   < > 0.83  CALCIUM 8.4*  --   --    < > = values in this interval not displayed.   ------------------------------------------------------------------------------------------------------------------  Cardiac Enzymes No results for input(s): TROPONINI in the last 168 hours. ------------------------------------------------------------------------------------------------------------------  RADIOLOGY:  No results found.   ASSESSMENT AND PLAN:   84 year old female with history of diabetes and hypothyroidism who presented with generalized weakness and dysuria.   1.  Acute hypoxic respiratory failure -acute bronchitis , metapneumo virus Given the new diagnosis of aspiration pneumonia on 10/23/2018 CT chest-patchy opacities with multifocal pneumonia no pulmonary embolism Pulmonology and ID consulted, appreciate the recommendations   IV Solu-Medrol tapering as patient is clinically better today  Rocephin and azithromycin changed to Unasyn p Wean oxygen as tolerated  2.  Acute kidney injury in the setting of UTI and dehydration which is improved  3.  QMG:QQPYP culture with insignificant growth  4.  Essential hypertension:  Continue metoprolol  5.  Hypothyroidism: Continue Synthroid  Generalized weakness PT is recommending skilled nursing  facility  Generalized weakness refusing skilled nursing facility and requesting home health physical therapy.  Will discharge patient home with home health as per patient and family request   Therapy is recommended skilled nursing facility upon discharge.  Clinical social worker consult placed. Management plans discussed with the patient and patient's husband Agreeable with the plan of care CODE STATUS: full  TOTAL TIME TAKING CARE OF THIS PATIENT: 33 minutes.     POSSIBLE D/C 1 to 2 days, DEPENDING ON CLINICAL CONDITION.   Nicholes Mango M.D on 10/25/2018 at 3:27 PM  Between 7am to 6pm - Pager - [phone] After 6pm go to [email or web address] - password [email or web address] [facility]  Office  [phone]  CC: Primary care physician; [place] Ruths, MD  Note: This dictation was prepared with Dragon dictation along with smaller phrase technology. Any transcriptional errors that result from this process are unintentional.

## 2018-10-26 LAB — GLUCOSE, CAPILLARY
Glucose-Capillary: 136 mg/dL — ABNORMAL HIGH (ref 70–99)
Glucose-Capillary: 193 mg/dL — ABNORMAL HIGH (ref 70–99)
Glucose-Capillary: 278 mg/dL — ABNORMAL HIGH (ref 70–99)
Glucose-Capillary: 260 mg/dL — ABNORMAL HIGH (ref 70–99)

## 2018-10-26 MED ORDER — ALBUTEROL SULFATE (2.5 MG/3ML) 0.083% IN NEBU: 2.5000 mg | INHALATION_SOLUTION | Freq: Four times a day (QID) | RESPIRATORY_TRACT | 1 refills | Status: DC | PRN

## 2018-10-26 MED ORDER — BENZONATATE 200 MG PO CAPS: 200.0000 mg | ORAL_CAPSULE | Freq: Three times a day (TID) | ORAL | 0 refills | Status: DC

## 2018-10-26 MED ORDER — METOPROLOL TARTRATE 5 MG/5ML IV SOLN
5.0000 mg | Freq: Once | INTRAVENOUS | Status: AC
  Administered 2018-10-26: 5 mg via INTRAVENOUS
  Filled 2018-10-26: qty 5

## 2018-10-26 MED ORDER — AMOXICILLIN-POT CLAVULANATE 875-125 MG PO TABS
1.0000 | ORAL_TABLET | Freq: Two times a day (BID) | ORAL | Status: DC
  Administered 2018-10-26 – 2018-10-27 (×2): 1 via ORAL
  Filled 2018-10-26 (×2): qty 1

## 2018-10-26 MED ORDER — PREDNISONE 10 MG (21) PO TBPK: 10.0000 mg | ORAL_TABLET | Freq: Every day | ORAL | 0 refills | Status: DC

## 2018-10-26 MED ORDER — METOPROLOL TARTRATE 25 MG PO TABS
25.0000 mg | ORAL_TABLET | Freq: Once | ORAL | Status: AC
  Administered 2018-10-26: 25 mg via ORAL
  Filled 2018-10-26: qty 1

## 2018-10-26 MED ORDER — AMOXICILLIN-POT CLAVULANATE 875-125 MG PO TABS: 1.0000 | ORAL_TABLET | Freq: Two times a day (BID) | ORAL | 0 refills | Status: DC

## 2018-10-26 MED ORDER — ALBUTEROL SULFATE (2.5 MG/3ML) 0.083% IN NEBU
2.5000 mg | INHALATION_SOLUTION | Freq: Four times a day (QID) | RESPIRATORY_TRACT | Status: DC
  Administered 2018-10-26 – 2018-10-27 (×4): 2.5 mg via RESPIRATORY_TRACT
  Filled 2018-10-26 (×4): qty 3

## 2018-10-26 MED ORDER — METOPROLOL TARTRATE 50 MG PO TABS
75.0000 mg | ORAL_TABLET | Freq: Two times a day (BID) | ORAL | Status: DC
  Administered 2018-10-26 – 2018-10-27 (×2): 75 mg via ORAL
  Filled 2018-10-26 (×2): qty 1

## 2018-10-26 NOTE — Discharge Instructions (Signed)
Follow-up with primary care physician in 3 to 5 days or sooner as needed

## 2018-10-26 NOTE — Discharge Summary (Signed)
[facility] NAME: Crystal Delacruz    MR#:  [id number]  DATE OF BIRTH:  Dec 26, 1923  DATE OF ADMISSION:  10/18/2018 ADMITTING PHYSICIAN: Saundra Shelling, MD  DATE OF DISCHARGE:  10/26/18  PRIMARY CARE PHYSICIAN: Kirk Ruths, MD    ADMISSION DIAGNOSIS:  Hypoxia [R09.02] Urinary tract infection without hematuria, site unspecified [N39.0]  DISCHARGE DIAGNOSIS:  Active Problems:   UTI (urinary tract infection)   SECONDARY DIAGNOSIS:   Past Medical History:  Diagnosis Date  . Diabetes mellitus without complication ([place])   . Hypertension   . Hypothyroidism     HOSPITAL COURSE:   HISTORY OF PRESENT ILLNESS: Crystal Delacruz  is a 84 y.o. female with a known history of diabetes mellitus, hypertension, hypothyroidism presented to the emergency room for generalized weakness.  Patient complains of dysuria and foul-smelling urine.  States she had some low-grade fever.  Appears dry and dehydrated.  Patient was evaluated in the emergency room her urinalysis was positive for infection and she was given IV Rocephin antibiotic.  Lactic acid was elevated.  Was started on IV fluids.  Oxygen saturation was low when she presented to the emergency room patient was put on oxygen via nasal cannula. chest x-ray did not reveal any pneumonia.  Patient uses a walker at home to ambulate.  No history of fall, head injury.  Hospitalist service was consulted for further care.  1.  Acute hypoxic respiratory failure -acute bronchitis , metapneumo virus With new diagnosis of aspiration pneumonia on 10/23/2018 CT chest-patchy opacities with multifocal pneumonia no pulmonary embolism Pulmonology and ID consulted, appreciate their  recommendations   IV Solu-Medrol tapered to p.o. prednisone as patient is clinically better today  Rocephin and azithromycin changed to Unasyn p-we will discharge patient home with p.o. Augmentin for 5 more days Patient still  requiring 2 L of oxygen via nasal cannula will arrange home oxygen Atrovent nebulizer machine and continue neb treatments with albuterol as needed basis  2.  Acute kidney injury in the setting of UTI and dehydration which is improved  3.  YJE:HUDJS culture with insignificant growth  4.  Essential hypertension: Elevated secondary to steroid use  continue metoprolol 50 mg and torsemide which was held during the hospital course resumed  5.  Hypothyroidism: Continue Synthroid  6.  Left ankle wound status post skin cancer removal  she's supposed to have daily dressing changes with non-adhering dressings; will provide dressing changes  Generalized weakness PT is recommending skilled nursing facility Patient is refusing skilled nursing facility and requesting home health physical therapy.  Will discharge patient home with home health as per patient and family request  Plan of care discussed with the patient, husband son and daughter-in-law at bedside they all verbalized understanding of the plan DISCHARGE CONDITIONS:  fair  CONSULTS OBTAINED:  Treatment Team:  Leonel Ramsay, MD   PROCEDURES none  DRUG ALLERGIES:   Allergies  Allergen Reactions  . Latex Swelling  . Sulfur     DISCHARGE MEDICATIONS:   Allergies as of 10/26/2018      Reactions   Latex Swelling   Sulfur       Medication List    TAKE these medications   acetaminophen 500 MG tablet Commonly known as:  TYLENOL Take 500 mg by mouth every 6 (six) hours as needed.   albuterol (2.5 MG/3ML) 0.083% nebulizer solution Commonly known as:  PROVENTIL Take 3 mLs (2.5 mg total) by nebulization every 6 (  six) hours as needed for wheezing or shortness of breath.   amoxicillin-clavulanate 875-125 MG tablet Commonly known as:  AUGMENTIN Take 1 tablet by mouth every 12 (twelve) hours.   aspirin EC 81 MG tablet Take 81 mg by mouth daily.   benzonatate 200 MG capsule Commonly known as:  TESSALON Take 1  capsule (200 mg total) by mouth 3 (three) times daily.   gabapentin 100 MG capsule Commonly known as:  NEURONTIN Take 200 mg by mouth 3 (three) times daily.   glimepiride 2 MG tablet Commonly known as:  AMARYL Take 2 mg by mouth every morning.   levothyroxine 50 MCG tablet Commonly known as:  SYNTHROID, LEVOTHROID Take 50 mcg by mouth daily.   metoprolol tartrate 50 MG tablet Commonly known as:  LOPRESSOR Take 50 mg by mouth 2 (two) times daily.   nortriptyline 25 MG capsule Commonly known as:  PAMELOR Take 25 mg by mouth 2 (two) times daily.   predniSONE 10 MG (21) Tbpk tablet Commonly known as:  STERAPRED UNI-PAK 21 TAB Take 1 tablet (10 mg total) by mouth daily. Take 6 tablets by mouth for 1 day followed by  5 tablets by mouth for 1 day followed by  4 tablets by mouth for 1 day followed by  3 tablets by mouth for 1 day followed by  2 tablets by mouth for 1 day followed by  1 tablet by mouth for a day and stop   torsemide 20 MG tablet Commonly known as:  DEMADEX Take 40 mg by mouth daily.   traMADol 50 MG tablet Commonly known as:  ULTRAM Take 50 mg by mouth every 6 (six) hours as needed.            Durable Medical Equipment  (From admission, onward)         Start     Ordered   10/26/18 1402  For home use only DME oxygen  Once    Question Answer Comment  Mode or (Route) Nasal cannula   Liters per Minute 2   Frequency Continuous (stationary and portable oxygen unit needed)   Oxygen conserving device Yes   Oxygen delivery system Gas      10/26/18 1401           DISCHARGE INSTRUCTIONS:   Follow-up with primary care physician in 3 to 5 days or sooner as needed  DIET:  Dysphagia 3 diet with thin liquids low-salt  DISCHARGE CONDITION:  Fair  ACTIVITY:  Activity as tolerated  OXYGEN:  Home Oxygen: Yes.     Oxygen Delivery: 2 liters/min via Patient connected to nasal cannula oxygen  DISCHARGE LOCATION:  home   If you experience worsening  of your admission symptoms, develop shortness of breath, life threatening emergency, suicidal or homicidal thoughts you must seek medical attention immediately by calling 911 or calling your MD immediately  if symptoms less severe.  You Must read complete instructions/literature along with all the possible adverse reactions/side effects for all the Medicines you take and that have been prescribed to you. Take any new Medicines after you have completely understood and accpet all the possible adverse reactions/side effects.   Please note  You were cared for by a hospitalist during your hospital stay. If you have any questions about your discharge medications or the care you received while you were in the hospital after you are discharged, you can call the unit and asked to speak with the hospitalist on call if the hospitalist that took care  of you is not available. Once you are discharged, your primary care physician will handle any further medical issues. Please note that NO REFILLS for any discharge medications will be authorized once you are discharged, as it is imperative that you return to your primary care physician (or establish a relationship with a primary care physician if you do not have one) for your aftercare needs so that they can reassess your need for medications and monitor your lab values.     Today  Chief Complaint  Patient presents with  . Weakness  . Cough     ROS:  CONSTITUTIONAL: Denies fevers, chills. Denies any fatigue, weakness.  EYES: Denies blurry vision, double vision, eye pain. EARS, NOSE, THROAT: Denies tinnitus, ear pain, hearing loss. RESPIRATORY: Denies cough, wheeze, shortness of breath.  CARDIOVASCULAR: Denies chest pain, palpitations, edema.  GASTROINTESTINAL: Denies nausea, vomiting, diarrhea, abdominal pain. Denies bright red blood per rectum. GENITOURINARY: Denies dysuria, hematuria. ENDOCRINE: Denies nocturia or thyroid problems. HEMATOLOGIC AND  LYMPHATIC: Denies easy bruising or bleeding. SKIN: Denies rash or lesion. MUSCULOSKELETAL: Denies pain in neck, back, shoulder, knees, hips or arthritic symptoms.  NEUROLOGIC: Denies paralysis, paresthesias.  PSYCHIATRIC: Denies anxiety or depressive symptoms.   VITAL SIGNS:  Blood pressure (!) 176/82, pulse 70, temperature 97.7 F (36.5 C), temperature source Oral, resp. rate 16, height 5\' 6"  (1.676 m), weight 80.7 kg, SpO2 91 %.  I/O:    Intake/Output Summary (Last 24 hours) at 10/26/2018 1423 Last data filed at 10/26/2018 0448 Gross per 24 hour  Intake 800 ml  Output -  Net 800 ml    PHYSICAL EXAMINATION:  GENERAL:  84 y.o.-year-old patient lying in the bed with no acute distress.  EYES: Pupils equal, round, reactive to light and accommodation. No scleral icterus. Extraocular muscles intact.  HEENT: Head atraumatic, normocephalic. Oropharynx and nasopharynx clear.  NECK:  Supple, no jugular venous distention. No thyroid enlargement, no tenderness.  LUNGS: Normal breath sounds bilaterally, no wheezing, rales,rhonchi or crepitation. No use of accessory muscles of respiration.  CARDIOVASCULAR: S1, S2 normal. No murmurs, rubs, or gallops.  ABDOMEN: Soft, non-tender, non-distended. Bowel sounds present. No organomegaly or mass.  EXTREMITIES: No pedal edema, cyanosis, or clubbing.  NEUROLOGIC: Cranial nerves II through XII are intact. Muscle strength 5/5 in all extremities. Sensation intact. Gait not checked.  PSYCHIATRIC: The patient is alert and oriented x 3.  SKIN: No obvious rash, lesion, or ulcer.   DATA REVIEW:   CBC Recent Labs  Lab 10/24/18 0314  WBC 4.7  HGB 13.0  HCT 42.6  PLT 139*    Chemistries  Recent Labs  Lab 10/21/18 0345  10/25/18 0443  NA 138  --   --   K 4.1  --   --   CL 101  --   --   CO2 29  --   --   GLUCOSE 208*  --   --   BUN 20  --   --   CREATININE 0.87   < > 0.83  CALCIUM 8.4*  --   --    < > = values in this interval not displayed.     Cardiac Enzymes No results for input(s): TROPONINI in the last 168 hours.  Microbiology Results  Results for orders placed or performed during the hospital encounter of 10/18/18  Respiratory Panel by PCR     Status: Abnormal   Collection Time: 10/20/18  4:13 PM  Result Value Ref Range Status   Adenovirus NOT DETECTED NOT  DETECTED Final   Coronavirus 229E NOT DETECTED NOT DETECTED Final   Coronavirus HKU1 NOT DETECTED NOT DETECTED Final   Coronavirus NL63 NOT DETECTED NOT DETECTED Final   Coronavirus OC43 NOT DETECTED NOT DETECTED Final   Metapneumovirus DETECTED (A) NOT DETECTED Final   Rhinovirus / Enterovirus NOT DETECTED NOT DETECTED Final   Influenza A NOT DETECTED NOT DETECTED Final   Influenza B NOT DETECTED NOT DETECTED Final   Parainfluenza Virus 1 NOT DETECTED NOT DETECTED Final   Parainfluenza Virus 2 NOT DETECTED NOT DETECTED Final   Parainfluenza Virus 3 NOT DETECTED NOT DETECTED Final   Parainfluenza Virus 4 NOT DETECTED NOT DETECTED Final   Respiratory Syncytial Virus NOT DETECTED NOT DETECTED Final   Bordetella pertussis NOT DETECTED NOT DETECTED Final   Chlamydophila pneumoniae NOT DETECTED NOT DETECTED Final   Mycoplasma pneumoniae NOT DETECTED NOT DETECTED Final    Comment: Performed at [facility], [place] [address], [place], [place] [address]  Urine Culture     Status: Abnormal   Collection Time: 10/20/18  4:20 PM  Result Value Ref Range Status   Specimen Description   Final    URINE, RANDOM Performed at [facility], [address], [place], [place] [address]    Special Requests   Final    Normal Performed at [facility], [address] [place], [place] [address]    Culture (A)  Final    <10,000 COLONIES/mL INSIGNIFICANT GROWTH Performed at [facility], [place] [address], [place], [place] [address]    Report Status 10/24/2018 FINAL  Final  MRSA PCR Screening     Status: None   Collection Time: 10/23/18  2:41 PM   Result Value Ref Range Status   MRSA by PCR NEGATIVE NEGATIVE Final    Comment:        The GeneXpert MRSA Assay (FDA approved for NASAL specimens only), is one component of a comprehensive MRSA colonization surveillance program. It is not intended to diagnose MRSA infection nor to guide or monitor treatment for MRSA infections. Performed at [facility], [address] [place], [place] [address]     RADIOLOGY:  Ct Chest W Contrast  Result Date: 10/23/2018 CLINICAL DATA:  Acute hypoxic respiratory failure, shortness of breath EXAM: CT CHEST WITH CONTRAST TECHNIQUE: Multidetector CT imaging of the chest was performed during intravenous contrast administration. CONTRAST:  28mL OMNIPAQUE IOHEXOL 300 MG/ML  SOLN COMPARISON:  Chest x-ray of 10/21/2018 FINDINGS: Cardiovascular: There is good opacification of the thoracic aorta and significant thoracic aortic atherosclerosis is present. The mid ascending thoracic aorta measures 43 mm in diameter indicative of fusiform aneurysmal dilatation. Recommend annual imaging followup by CTA or MRA. This recommendation follows 2010 ACCF/AHA/AATS/ACR/ASA/SCA/SCAI/SIR/STS/SVM Guidelines for the Diagnosis and Management of Patients with Thoracic Aortic Disease. Circulation. 2010; 121: X448-J856. The heart is minimally enlarged and no pericardial effusion is seen. Coronary artery calcifications are noted diffusely. There is good opacification of the pulmonary arteries which appears slightly prominent which may indicate a degree of pulmonary arterial hypertension. No embolus is evident. Mediastinum/Nodes: There is mild left hilar and mediastinal adenopathy. Within the precarinal mediastinum there is a 16 mm node on image 48 series 2. Within the left hilum there is a node of 9 mm on image 63. No significant adenopathy is seen. The thyroid gland is well visualized and there is a 9 mm low-attenuation nodule in the left lobe of thyroid. In this age patient this  finding is of  doubtful clinical significance. Also there is some calcification within the left lobe of thyroid as well. There is significant degenerative change at the sternoclavicular joints bilaterally. Lungs/Pleura: On lung window images, there are parenchymal opacities within the posterior inferior right upper lobe, right middle lobe, right lower lobe posteriorly, left lower lobe particularly this appears segment, in the inferior aspect of the left upper lobe indicative of multifocal pneumonia. Air bronchograms course through several of these opacities. There may be very mild bronchiectatic change present particularly in the right lower lobe posteriorly. No pleural effusion is seen. No suspicious lung nodule is evident. Upper Abdomen: The portion of the upper abdomen that is visualized and this study is unremarkable. Musculoskeletal: There is a thoracic scoliosis convex the left with diffuse degenerative change throughout the thoracic spine. No compression deformity is seen. IMPRESSION: 1. No acute abnormality of the thoracic aorta and no pulmonary embolism is seen. 2. However there are patchy opacities throughout both lungs consistent with multifocal pneumonia. Mild bronchiectatic change is present in the right lower lobe posteriorly. 3. Fusiform dilatation of the mid ascending thoracic aorta measuring 4.3 cm. Recommend annual imaging followup by CTA or MRA. This recommendation follows 2010 ACCF/AHA/AATS/ACR/ASA/SCA/SCAI/SIR/STS/SVM Guidelines for the Diagnosis and Management of Patients with Thoracic Aortic Disease. Circulation. 2010; 121: D741-O878. 4. Thoracic scoliosis with diffuse degenerative change. Electronically Signed   By: Ivar Drape M.D.   On: 10/23/2018 11:06    EKG:   Orders placed or performed during the hospital encounter of 10/18/18  . ED EKG  . ED EKG  . EKG 12-Lead  . EKG 12-Lead  . EKG      Management plans discussed with the patient, family and they are in agreement.  CODE  STATUS:     Code Status Orders  (From admission, onward)         Start     Ordered   10/18/18 1825  Full code  Continuous     10/18/18 1824        Code Status History    This patient has a current code status but no historical code status.      TOTAL TIME TAKING CARE OF THIS PATIENT: 45 minutes.   Note: This dictation was prepared with Dragon dictation along with smaller phrase technology. Any transcriptional errors that result from this process are unintentional.   @MEC @  on 10/26/2018 at 2:23 PM  Between 7am to 6pm - Pager - [phone]  After 6pm go to [email or web address] - password [email or web address] [facility]  [facility]  Office  [phone]  CC: Primary care physician; Kirk Ruths, MD

## 2018-10-26 NOTE — Progress Notes (Signed)
Called Dr. Jodell Cipro regarding patient's elevated blood pressure.  Appropriate orders were placed.  Christene Slates 10/26/2018 5:13 AM

## 2018-10-26 NOTE — Care Management Note (Addendum)
Case Management Note  Patient Details  Name: Crystal Delacruz MRN: [id number] Date of Birth: [birth year]  Subjective/Objective:   Patient to be discharged per MD order. Orders in place for home health services. Patient was previosuly worked up to home with services via [facility]. jermaine aware of referral.  DMe nebulizer obtained from [facility] and brought to bedside. DME orders for O2. Per [facility] patient does not qualify due to lack of qualifying diagnosis. Notified MD who stated that the patients respiratory failure, bronchitis and PNA were all acute issues only. Per primary RN patient is only requiring O2 at night at this time. Spoke with [facility] who states they can possibly provide the O2 but need an overnight pulse oximetry study. Orders obtained from MD. Informed respiratory team of orders as well. Per [facility] if overnight study can be obtained they should be able to arrange oxygen nocturnally. Hopefully, the overnight sleep study is enough to qualify the patient for home O2 because the family is unable to afford the out of pocket fee for oxygen.                  Action/Plan:   Expected Discharge Date:  10/26/18               Expected Discharge Plan:  [place]  In-House Referral:     Discharge planning Services  CM Consult  Post Acute Care Choice:  Home Health Choice offered to:  Patient, Spouse  DME Arranged:  Oxygen, Nebulizer/meds DME Agency:  [place]  HH Arranged:  RN, PT, Nurse's Aide, Refused SNF [facility] Agency:  [place]  Status of Service:  In process, will continue to follow  If discussed at Long Length of Stay Meetings, dates discussed:    Additional Comments:  Latanya Maudlin, RN 10/26/2018, 3:51 PM

## 2018-10-27 ENCOUNTER — Encounter: Payer: Self-pay | Admitting: Internal Medicine

## 2018-10-27 LAB — GLUCOSE, CAPILLARY
Glucose-Capillary: 103 mg/dL — ABNORMAL HIGH (ref 70–99)
Glucose-Capillary: 161 mg/dL — ABNORMAL HIGH (ref 70–99)

## 2018-10-27 MED ORDER — ALBUTEROL SULFATE (2.5 MG/3ML) 0.083% IN NEBU: 2.5000 mg | INHALATION_SOLUTION | Freq: Four times a day (QID) | RESPIRATORY_TRACT | 0 refills | Status: DC | PRN

## 2018-10-27 NOTE — Progress Notes (Signed)
Speech Therapy Note: reviewed chart notes; consulted NSG re: pt's status today. Pt, family and NSG denied any difficulty w/ swallowing at her meals; husband was assisting by feeding pt ice cream from the tray while in room. NSG did note pt had min difficulty swallowing Pills Whole w/ Water this morning - one seemed to dissolve while she was preparing to swallow it. Pt also felt she had min difficulty swallowing the large pill this morning; she needed applesauce afterward to help clear it(?). Suggested to NSG to educate pt and family on the need to use Applesauce or Yogurt or Ice Cream ALL of the time when swallowing Pills to ease swallowing and clearing of large pills or dissolving pills. This had been suggested during the BSE.  No further skilled ST services indicated at this time. NSG to provide education on Pill swallowing and use of Puree.    Orinda Kenner, [place], CCC-SLP

## 2018-10-27 NOTE — Care Management Important Message (Signed)
Important Message  Patient Details  Name: Crystal Delacruz MRN: [id number] Date of Birth: June 18, 1924   Medicare Important Message Given:  Yes    Beverly Sessions, RN 10/27/2018, 3:04 PM

## 2018-10-27 NOTE — Discharge Summary (Addendum)
[facility] NAME: Crystal Delacruz    MR#:  [id number]  DATE OF BIRTH:  1924/05/01  DATE OF ADMISSION:  10/18/2018 ADMITTING PHYSICIAN: Saundra Shelling, MD  DATE OF DISCHARGE: October 27, 2018  PRIMARY CARE PHYSICIAN: Kirk Ruths, MD    ADMISSION DIAGNOSIS:  Hypoxia [R09.02] Urinary tract infection without hematuria, site unspecified [N39.0]  DISCHARGE DIAGNOSIS:  Active Problems:   UTI (urinary tract infection)   SECONDARY DIAGNOSIS:   Past Medical History:  Diagnosis Date  . Asthma   . Diabetes mellitus without complication ([place])   . Hypertension   . Hypothyroidism     HOSPITAL COURSE:  84 year old female with a history of diabetes and essential hypertension who presented to the emergency room due to generalized weakness. 1.  Acute hypoxic respiratory failure due to acute bronchitis and Metopneumonia virus on respiratory panel. Patient symptoms have improved.  She will need oxygen upon discharge. Continue steroid taper Continue Vantin for bronchitis 2.  Acute kidney injury in setting of dehydration which is improved  3.  Asymptomatic bacteriuria: Urine culture with any significant growth  4.  Essential hypertension: Patient will continue metgoprolol 5.  Diabetes: Continue ADA diet with outpatient regimen  6.  Hypothyroidism: Continue Synthroid outPatient palliative care  DISCHARGE CONDITIONS AND DIET:   Stable for discharge on heart healthy diabetic diet  CONSULTS OBTAINED:  Treatment Team:  Leonel Ramsay, MD  DRUG ALLERGIES:   Allergies  Allergen Reactions  . Latex Swelling  . Sulfur     DISCHARGE MEDICATIONS:   Allergies as of 10/27/2018      Reactions   Latex Swelling   Sulfur       Medication List    TAKE these medications   acetaminophen 500 MG tablet Commonly known as:  TYLENOL Take 500 mg by mouth every 6 (six) hours as needed.   albuterol (2.5 MG/3ML) 0.083% nebulizer  solution Commonly known as:  PROVENTIL Take 3 mLs (2.5 mg total) by nebulization every 6 (six) hours as needed for wheezing or shortness of breath.   amoxicillin-clavulanate 875-125 MG tablet Commonly known as:  AUGMENTIN Take 1 tablet by mouth every 12 (twelve) hours.   aspirin EC 81 MG tablet Take 81 mg by mouth daily.   benzonatate 200 MG capsule Commonly known as:  TESSALON Take 1 capsule (200 mg total) by mouth 3 (three) times daily.   gabapentin 100 MG capsule Commonly known as:  NEURONTIN Take 200 mg by mouth 3 (three) times daily.   glimepiride 2 MG tablet Commonly known as:  AMARYL Take 2 mg by mouth every morning.   levothyroxine 50 MCG tablet Commonly known as:  SYNTHROID, LEVOTHROID Take 50 mcg by mouth daily.   metoprolol tartrate 50 MG tablet Commonly known as:  LOPRESSOR Take 50 mg by mouth 2 (two) times daily.   nortriptyline 25 MG capsule Commonly known as:  PAMELOR Take 25 mg by mouth 2 (two) times daily.   predniSONE 10 MG (21) Tbpk tablet Commonly known as:  STERAPRED UNI-PAK 21 TAB Take 1 tablet (10 mg total) by mouth daily. Take 6 tablets by mouth for 1 day followed by  5 tablets by mouth for 1 day followed by  4 tablets by mouth for 1 day followed by  3 tablets by mouth for 1 day followed by  2 tablets by mouth for 1 day followed by  1 tablet by mouth for a day and stop   torsemide  20 MG tablet Commonly known as:  DEMADEX Take 40 mg by mouth daily.   traMADol 50 MG tablet Commonly known as:  ULTRAM Take 50 mg by mouth every 6 (six) hours as needed.            Durable Medical Equipment  (From admission, onward)         Start     Ordered   10/27/18 0954  For home use only DME oxygen  Once    Comments:  Portable oxygen concentrator  Question Answer Comment  Mode or (Route) Nasal cannula   Liters per Minute 2   Frequency Continuous (stationary and portable oxygen unit needed)   Oxygen conserving device Yes   Oxygen delivery  system Gas      10/27/18 0953   10/26/18 1426  For home use only DME Nebulizer machine  Once    Comments:  Acute hypoxic respiratory failure, aspiration pneumonia  Question:  Patient needs a nebulizer to treat with the following condition  Answer:  Shortness of breath   10/26/18 1426            Today   CHIEF COMPLAINT:  No acute events overnight.  Patient ready to go home today   VITAL SIGNS:  Blood pressure (!) 168/84, pulse (!) 57, temperature 98 F (36.7 C), temperature source Oral, resp. rate 14, height 5\' 6"  (1.676 m), weight 80.7 kg, SpO2 94 %.   REVIEW OF SYSTEMS:  Review of Systems  Constitutional: Negative.  Negative for chills, fever and malaise/fatigue.  HENT: Negative.  Negative for ear discharge, ear pain, hearing loss, nosebleeds and sore throat.   Eyes: Negative.  Negative for blurred vision and pain.  Respiratory: Negative.  Negative for cough, hemoptysis, shortness of breath and wheezing.   Cardiovascular: Negative.  Negative for chest pain, palpitations and leg swelling.  Gastrointestinal: Negative.  Negative for abdominal pain, blood in stool, diarrhea, nausea and vomiting.  Genitourinary: Negative.  Negative for dysuria.  Musculoskeletal: Negative.  Negative for back pain.  Skin: Negative.   Neurological: Negative for dizziness, tremors, speech change, focal weakness, seizures and headaches.  Endo/Heme/Allergies: Negative.  Does not bruise/bleed easily.  Psychiatric/Behavioral: Negative.  Negative for depression, hallucinations and suicidal ideas.     PHYSICAL EXAMINATION:  GENERAL:  84 y.o.-year-old patient lying in the bed with no acute distress.  NECK:  Supple, no jugular venous distention. No thyroid enlargement, no tenderness.  LUNGS: Normal breath sounds bilaterally, no wheezing, rales,rhonchi  No use of accessory muscles of respiration.  CARDIOVASCULAR: S1, S2 normal. No murmurs, rubs, or gallops.  ABDOMEN: Soft, non-tender, non-distended.  Bowel sounds present. No organomegaly or mass.  EXTREMITIES: No pedal edema, cyanosis, or clubbing.  PSYCHIATRIC: The patient is alert and oriented x 3.  SKIN: No obvious rash, lesion, or ulcer.   DATA REVIEW:   CBC Recent Labs  Lab 10/24/18 0314  WBC 4.7  HGB 13.0  HCT 42.6  PLT 139*    Chemistries  Recent Labs  Lab 10/21/18 0345  10/25/18 0443  NA 138  --   --   K 4.1  --   --   CL 101  --   --   CO2 29  --   --   GLUCOSE 208*  --   --   BUN 20  --   --   CREATININE 0.87   < > 0.83  CALCIUM 8.4*  --   --    < > = values in this  interval not displayed.    Cardiac Enzymes No results for input(s): TROPONINI in the last 168 hours.  Microbiology Results  @MICRORSLT48 @  RADIOLOGY:  No results found.    Allergies as of 10/27/2018      Reactions   Latex Swelling   Sulfur       Medication List    TAKE these medications   acetaminophen 500 MG tablet Commonly known as:  TYLENOL Take 500 mg by mouth every 6 (six) hours as needed.   albuterol (2.5 MG/3ML) 0.083% nebulizer solution Commonly known as:  PROVENTIL Take 3 mLs (2.5 mg total) by nebulization every 6 (six) hours as needed for wheezing or shortness of breath.   amoxicillin-clavulanate 875-125 MG tablet Commonly known as:  AUGMENTIN Take 1 tablet by mouth every 12 (twelve) hours.   aspirin EC 81 MG tablet Take 81 mg by mouth daily.   benzonatate 200 MG capsule Commonly known as:  TESSALON Take 1 capsule (200 mg total) by mouth 3 (three) times daily.   gabapentin 100 MG capsule Commonly known as:  NEURONTIN Take 200 mg by mouth 3 (three) times daily.   glimepiride 2 MG tablet Commonly known as:  AMARYL Take 2 mg by mouth every morning.   levothyroxine 50 MCG tablet Commonly known as:  SYNTHROID, LEVOTHROID Take 50 mcg by mouth daily.   metoprolol tartrate 50 MG tablet Commonly known as:  LOPRESSOR Take 50 mg by mouth 2 (two) times daily.   nortriptyline 25 MG capsule Commonly known  as:  PAMELOR Take 25 mg by mouth 2 (two) times daily.   predniSONE 10 MG (21) Tbpk tablet Commonly known as:  STERAPRED UNI-PAK 21 TAB Take 1 tablet (10 mg total) by mouth daily. Take 6 tablets by mouth for 1 day followed by  5 tablets by mouth for 1 day followed by  4 tablets by mouth for 1 day followed by  3 tablets by mouth for 1 day followed by  2 tablets by mouth for 1 day followed by  1 tablet by mouth for a day and stop   torsemide 20 MG tablet Commonly known as:  DEMADEX Take 40 mg by mouth daily.   traMADol 50 MG tablet Commonly known as:  ULTRAM Take 50 mg by mouth every 6 (six) hours as needed.            Durable Medical Equipment  (From admission, onward)         Start     Ordered   10/27/18 0954  For home use only DME oxygen  Once    Comments:  Portable oxygen concentrator  Question Answer Comment  Mode or (Route) Nasal cannula   Liters per Minute 2   Frequency Continuous (stationary and portable oxygen unit needed)   Oxygen conserving device Yes   Oxygen delivery system Gas      10/27/18 0953   10/26/18 1426  For home use only DME Nebulizer machine  Once    Comments:  Acute hypoxic respiratory failure, aspiration pneumonia  Question:  Patient needs a nebulizer to treat with the following condition  Answer:  Shortness of breath   10/26/18 1426             Management plans discussed with the patient and she is in agreement. Stable for discharge   Patient should follow up with pcp  CODE STATUS:     Code Status Orders  (From admission, onward)         Start  Ordered   10/18/18 1825  Full code  Continuous     10/18/18 1824        Code Status History    This patient has a current code status but no historical code status.      TOTAL TIME TAKING CARE OF THIS PATIENT: 38 minutes.    Note: This dictation was prepared with Dragon dictation along with smaller phrase technology. Any transcriptional errors that result from this  process are unintentional.  Devonta Blanford M.D on 10/27/2018 at 11:54 AM  Between 7am to 6pm - Pager - [phone] After 6pm go to [email or web address] - password [email or web address] [facility]  Office  [phone]  CC: Primary care physician; Kirk Ruths, MD

## 2018-10-27 NOTE — Progress Notes (Signed)
Follow up on new in home palliative care referral. Patient will be discharging home with [facility] services. Patient discharging home today.  Discharge summary faxed to the office.  Thank you for allowing participation in this patient's care.  Dimas Aguas, MA, BSN, RN, FNE [facility]

## 2018-10-27 NOTE — Progress Notes (Signed)
SATURATION QUALIFICATIONS: (This note is used to comply with regulatory documentation for home oxygen)  Patient Saturations on Room Air at Rest = 85%   

## 2018-10-27 NOTE — Care Management Note (Signed)
Case Management Note  Patient Details  Name: [name] MRN: [id number] Date of Birth: Oct 31, 1924   Patient to discharge home today.  Corene Cornea with [facility] notified of discharge. Patient with qualifying sats for continuous home O2.  MD requesting continuous O2.  Per Caryl Pina with [facility] they will be using patient's past history of Asthma for qualifying diagnosis.  Portable concentrator to be delivered to room prior to discharge.   Subjective/Objective:                    Action/Plan:   Expected Discharge Date:  10/27/18               Expected Discharge Plan:  [place]  In-House Referral:     Discharge planning Services  CM Consult  Post Acute Care Choice:  Home Health Choice offered to:  Patient, Spouse  DME Arranged:  Oxygen, Nebulizer/meds DME Agency:  [place]  HH Arranged:  RN, PT, Nurse's Aide, Refused SNF [facility] Agency:  [facility]  Status of Service:  Completed, signed off  If discussed at [place] of Stay Meetings, dates discussed:    Additional Comments:  Beverly Sessions, RN 10/27/2018, 1:53 PM

## 2018-10-28 DIAGNOSIS — J189 Pneumonia, unspecified organism: Secondary | ICD-10-CM | POA: Diagnosis not present

## 2018-10-28 DIAGNOSIS — N390 Urinary tract infection, site not specified: Secondary | ICD-10-CM | POA: Diagnosis not present

## 2018-10-28 DIAGNOSIS — Z7984 Long term (current) use of oral hypoglycemic drugs: Secondary | ICD-10-CM | POA: Diagnosis not present

## 2018-10-28 DIAGNOSIS — I10 Essential (primary) hypertension: Secondary | ICD-10-CM | POA: Diagnosis not present

## 2018-10-28 DIAGNOSIS — M6281 Muscle weakness (generalized): Secondary | ICD-10-CM | POA: Diagnosis not present

## 2018-10-28 DIAGNOSIS — J208 Acute bronchitis due to other specified organisms: Secondary | ICD-10-CM | POA: Diagnosis not present

## 2018-10-28 DIAGNOSIS — E039 Hypothyroidism, unspecified: Secondary | ICD-10-CM | POA: Diagnosis not present

## 2018-10-28 DIAGNOSIS — E119 Type 2 diabetes mellitus without complications: Secondary | ICD-10-CM | POA: Diagnosis not present

## 2018-10-28 DIAGNOSIS — Z7982 Long term (current) use of aspirin: Secondary | ICD-10-CM | POA: Diagnosis not present

## 2018-11-04 DIAGNOSIS — M6281 Muscle weakness (generalized): Secondary | ICD-10-CM | POA: Diagnosis not present

## 2018-11-04 DIAGNOSIS — I10 Essential (primary) hypertension: Secondary | ICD-10-CM | POA: Diagnosis not present

## 2018-11-04 DIAGNOSIS — E039 Hypothyroidism, unspecified: Secondary | ICD-10-CM | POA: Diagnosis not present

## 2018-11-04 DIAGNOSIS — J208 Acute bronchitis due to other specified organisms: Secondary | ICD-10-CM | POA: Diagnosis not present

## 2018-11-04 DIAGNOSIS — E119 Type 2 diabetes mellitus without complications: Secondary | ICD-10-CM | POA: Diagnosis not present

## 2018-11-04 DIAGNOSIS — J189 Pneumonia, unspecified organism: Secondary | ICD-10-CM | POA: Diagnosis not present

## 2018-11-04 DIAGNOSIS — N390 Urinary tract infection, site not specified: Secondary | ICD-10-CM | POA: Diagnosis not present

## 2018-11-04 DIAGNOSIS — Z7982 Long term (current) use of aspirin: Secondary | ICD-10-CM | POA: Diagnosis not present

## 2018-11-06 DIAGNOSIS — J4520 Mild intermittent asthma, uncomplicated: Secondary | ICD-10-CM | POA: Diagnosis not present

## 2018-11-06 DIAGNOSIS — M79642 Pain in left hand: Secondary | ICD-10-CM | POA: Diagnosis not present

## 2018-11-12 DIAGNOSIS — H401132 Primary open-angle glaucoma, bilateral, moderate stage: Secondary | ICD-10-CM | POA: Diagnosis not present

## 2018-11-18 DIAGNOSIS — H401132 Primary open-angle glaucoma, bilateral, moderate stage: Secondary | ICD-10-CM | POA: Diagnosis not present

## 2018-11-21 DIAGNOSIS — Z515 Encounter for palliative care: Secondary | ICD-10-CM | POA: Diagnosis not present

## 2018-11-21 DIAGNOSIS — J9601 Acute respiratory failure with hypoxia: Secondary | ICD-10-CM | POA: Diagnosis not present

## 2018-11-24 DIAGNOSIS — E1122 Type 2 diabetes mellitus with diabetic chronic kidney disease: Secondary | ICD-10-CM | POA: Diagnosis not present

## 2018-11-24 DIAGNOSIS — N183 Chronic kidney disease, stage 3 (moderate): Secondary | ICD-10-CM | POA: Diagnosis not present

## 2018-11-24 DIAGNOSIS — I10 Essential (primary) hypertension: Secondary | ICD-10-CM | POA: Diagnosis not present

## 2018-11-27 DIAGNOSIS — J9601 Acute respiratory failure with hypoxia: Secondary | ICD-10-CM | POA: Diagnosis not present

## 2018-12-01 DIAGNOSIS — R0602 Shortness of breath: Secondary | ICD-10-CM | POA: Diagnosis not present

## 2018-12-01 DIAGNOSIS — J4520 Mild intermittent asthma, uncomplicated: Secondary | ICD-10-CM | POA: Diagnosis not present

## 2018-12-01 DIAGNOSIS — N183 Chronic kidney disease, stage 3 (moderate): Secondary | ICD-10-CM | POA: Diagnosis not present

## 2018-12-01 DIAGNOSIS — I700 Atherosclerosis of aorta: Secondary | ICD-10-CM | POA: Diagnosis not present

## 2018-12-01 DIAGNOSIS — Z0000 Encounter for general adult medical examination without abnormal findings: Secondary | ICD-10-CM | POA: Diagnosis not present

## 2018-12-01 DIAGNOSIS — Z789 Other specified health status: Secondary | ICD-10-CM | POA: Diagnosis not present

## 2018-12-01 DIAGNOSIS — E1122 Type 2 diabetes mellitus with diabetic chronic kidney disease: Secondary | ICD-10-CM | POA: Diagnosis not present

## 2018-12-01 DIAGNOSIS — I10 Essential (primary) hypertension: Secondary | ICD-10-CM | POA: Diagnosis not present

## 2018-12-01 DIAGNOSIS — E039 Hypothyroidism, unspecified: Secondary | ICD-10-CM | POA: Diagnosis not present

## 2018-12-01 DIAGNOSIS — Z9989 Dependence on other enabling machines and devices: Secondary | ICD-10-CM | POA: Diagnosis not present

## 2018-12-01 DIAGNOSIS — E782 Mixed hyperlipidemia: Secondary | ICD-10-CM | POA: Diagnosis not present

## 2018-12-17 ENCOUNTER — Telehealth: Payer: Self-pay

## 2018-12-17 DIAGNOSIS — Z85828 Personal history of other malignant neoplasm of skin: Secondary | ICD-10-CM | POA: Diagnosis not present

## 2018-12-17 DIAGNOSIS — I8312 Varicose veins of left lower extremity with inflammation: Secondary | ICD-10-CM | POA: Diagnosis not present

## 2018-12-17 DIAGNOSIS — L820 Inflamed seborrheic keratosis: Secondary | ICD-10-CM | POA: Diagnosis not present

## 2018-12-17 DIAGNOSIS — L578 Other skin changes due to chronic exposure to nonionizing radiation: Secondary | ICD-10-CM | POA: Diagnosis not present

## 2018-12-17 DIAGNOSIS — I8311 Varicose veins of right lower extremity with inflammation: Secondary | ICD-10-CM | POA: Diagnosis not present

## 2018-12-17 DIAGNOSIS — L821 Other seborrheic keratosis: Secondary | ICD-10-CM | POA: Diagnosis not present

## 2018-12-17 NOTE — Telephone Encounter (Signed)
Telephone call to follow-up on patient wanting to update HCPOA and LW documents. Spoke to both patient and husband who state they will have to call SW back when they are able to arrange witnesses or has an upcoming doctor's appointment where SW can assist with completion. Contact information left.

## 2018-12-28 DIAGNOSIS — J9601 Acute respiratory failure with hypoxia: Secondary | ICD-10-CM | POA: Diagnosis not present

## 2019-03-03 DIAGNOSIS — L821 Other seborrheic keratosis: Secondary | ICD-10-CM | POA: Diagnosis not present

## 2019-03-03 DIAGNOSIS — L820 Inflamed seborrheic keratosis: Secondary | ICD-10-CM | POA: Diagnosis not present

## 2019-03-03 DIAGNOSIS — Z85828 Personal history of other malignant neoplasm of skin: Secondary | ICD-10-CM | POA: Diagnosis not present

## 2019-03-25 DIAGNOSIS — N183 Chronic kidney disease, stage 3 (moderate): Secondary | ICD-10-CM | POA: Diagnosis not present

## 2019-03-25 DIAGNOSIS — I10 Essential (primary) hypertension: Secondary | ICD-10-CM | POA: Diagnosis not present

## 2019-03-25 DIAGNOSIS — E039 Hypothyroidism, unspecified: Secondary | ICD-10-CM | POA: Diagnosis not present

## 2019-03-25 DIAGNOSIS — I700 Atherosclerosis of aorta: Secondary | ICD-10-CM | POA: Diagnosis not present

## 2019-03-25 DIAGNOSIS — E1122 Type 2 diabetes mellitus with diabetic chronic kidney disease: Secondary | ICD-10-CM | POA: Diagnosis not present

## 2019-03-25 DIAGNOSIS — E782 Mixed hyperlipidemia: Secondary | ICD-10-CM | POA: Diagnosis not present

## 2019-04-01 DIAGNOSIS — E039 Hypothyroidism, unspecified: Secondary | ICD-10-CM | POA: Diagnosis not present

## 2019-04-01 DIAGNOSIS — B351 Tinea unguium: Secondary | ICD-10-CM | POA: Diagnosis not present

## 2019-04-01 DIAGNOSIS — I10 Essential (primary) hypertension: Secondary | ICD-10-CM | POA: Diagnosis not present

## 2019-04-01 DIAGNOSIS — E1122 Type 2 diabetes mellitus with diabetic chronic kidney disease: Secondary | ICD-10-CM | POA: Diagnosis not present

## 2019-04-01 DIAGNOSIS — E1142 Type 2 diabetes mellitus with diabetic polyneuropathy: Secondary | ICD-10-CM | POA: Diagnosis not present

## 2019-04-01 DIAGNOSIS — Z9989 Dependence on other enabling machines and devices: Secondary | ICD-10-CM | POA: Diagnosis not present

## 2019-04-01 DIAGNOSIS — M791 Myalgia, unspecified site: Secondary | ICD-10-CM | POA: Diagnosis not present

## 2019-04-01 DIAGNOSIS — I700 Atherosclerosis of aorta: Secondary | ICD-10-CM | POA: Diagnosis not present

## 2019-04-01 DIAGNOSIS — E782 Mixed hyperlipidemia: Secondary | ICD-10-CM | POA: Diagnosis not present

## 2019-04-01 DIAGNOSIS — N183 Chronic kidney disease, stage 3 (moderate): Secondary | ICD-10-CM | POA: Diagnosis not present

## 2019-04-01 DIAGNOSIS — T466X5A Adverse effect of antihyperlipidemic and antiarteriosclerotic drugs, initial encounter: Secondary | ICD-10-CM | POA: Diagnosis not present

## 2019-04-10 DIAGNOSIS — I700 Atherosclerosis of aorta: Secondary | ICD-10-CM | POA: Diagnosis not present

## 2019-04-10 DIAGNOSIS — E1122 Type 2 diabetes mellitus with diabetic chronic kidney disease: Secondary | ICD-10-CM | POA: Diagnosis not present

## 2019-04-10 DIAGNOSIS — N183 Chronic kidney disease, stage 3 (moderate): Secondary | ICD-10-CM | POA: Diagnosis not present

## 2019-04-10 DIAGNOSIS — E782 Mixed hyperlipidemia: Secondary | ICD-10-CM | POA: Diagnosis not present

## 2019-04-10 DIAGNOSIS — I10 Essential (primary) hypertension: Secondary | ICD-10-CM | POA: Diagnosis not present

## 2019-05-04 DIAGNOSIS — L578 Other skin changes due to chronic exposure to nonionizing radiation: Secondary | ICD-10-CM | POA: Diagnosis not present

## 2019-05-04 DIAGNOSIS — L820 Inflamed seborrheic keratosis: Secondary | ICD-10-CM | POA: Diagnosis not present

## 2019-05-04 DIAGNOSIS — L821 Other seborrheic keratosis: Secondary | ICD-10-CM | POA: Diagnosis not present

## 2019-05-14 ENCOUNTER — Telehealth: Payer: Self-pay | Admitting: Student

## 2019-05-14 NOTE — Telephone Encounter (Signed)
Palliative NP spoke with patient to follow up with her. She denies having any needs. She declines to have Palliative Medicine services continue.

## 2019-05-19 DIAGNOSIS — E119 Type 2 diabetes mellitus without complications: Secondary | ICD-10-CM | POA: Diagnosis not present

## 2019-07-06 DIAGNOSIS — L600 Ingrowing nail: Secondary | ICD-10-CM | POA: Diagnosis not present

## 2019-07-06 DIAGNOSIS — E1142 Type 2 diabetes mellitus with diabetic polyneuropathy: Secondary | ICD-10-CM | POA: Diagnosis not present

## 2019-07-06 DIAGNOSIS — B351 Tinea unguium: Secondary | ICD-10-CM | POA: Diagnosis not present

## 2019-07-27 DIAGNOSIS — I10 Essential (primary) hypertension: Secondary | ICD-10-CM | POA: Diagnosis not present

## 2019-07-27 DIAGNOSIS — E782 Mixed hyperlipidemia: Secondary | ICD-10-CM | POA: Diagnosis not present

## 2019-07-27 DIAGNOSIS — E1122 Type 2 diabetes mellitus with diabetic chronic kidney disease: Secondary | ICD-10-CM | POA: Diagnosis not present

## 2019-07-27 DIAGNOSIS — N183 Chronic kidney disease, stage 3 (moderate): Secondary | ICD-10-CM | POA: Diagnosis not present

## 2019-08-03 DIAGNOSIS — E782 Mixed hyperlipidemia: Secondary | ICD-10-CM | POA: Diagnosis not present

## 2019-08-03 DIAGNOSIS — E1122 Type 2 diabetes mellitus with diabetic chronic kidney disease: Secondary | ICD-10-CM | POA: Diagnosis not present

## 2019-08-03 DIAGNOSIS — N183 Chronic kidney disease, stage 3 (moderate): Secondary | ICD-10-CM | POA: Diagnosis not present

## 2019-08-03 DIAGNOSIS — T466X5A Adverse effect of antihyperlipidemic and antiarteriosclerotic drugs, initial encounter: Secondary | ICD-10-CM | POA: Diagnosis not present

## 2019-08-03 DIAGNOSIS — Z9989 Dependence on other enabling machines and devices: Secondary | ICD-10-CM | POA: Diagnosis not present

## 2019-08-03 DIAGNOSIS — R1310 Dysphagia, unspecified: Secondary | ICD-10-CM | POA: Diagnosis not present

## 2019-08-03 DIAGNOSIS — I10 Essential (primary) hypertension: Secondary | ICD-10-CM | POA: Diagnosis not present

## 2019-08-03 DIAGNOSIS — E039 Hypothyroidism, unspecified: Secondary | ICD-10-CM | POA: Diagnosis not present

## 2019-08-03 DIAGNOSIS — Z23 Encounter for immunization: Secondary | ICD-10-CM | POA: Diagnosis not present

## 2019-08-03 DIAGNOSIS — M791 Myalgia, unspecified site: Secondary | ICD-10-CM | POA: Diagnosis not present

## 2019-08-31 DIAGNOSIS — R1310 Dysphagia, unspecified: Secondary | ICD-10-CM | POA: Diagnosis not present

## 2019-10-27 DIAGNOSIS — T466X5A Adverse effect of antihyperlipidemic and antiarteriosclerotic drugs, initial encounter: Secondary | ICD-10-CM | POA: Diagnosis not present

## 2019-10-27 DIAGNOSIS — M791 Myalgia, unspecified site: Secondary | ICD-10-CM | POA: Diagnosis not present

## 2019-10-27 DIAGNOSIS — E782 Mixed hyperlipidemia: Secondary | ICD-10-CM | POA: Diagnosis not present

## 2019-10-27 DIAGNOSIS — I700 Atherosclerosis of aorta: Secondary | ICD-10-CM | POA: Diagnosis not present

## 2019-10-27 DIAGNOSIS — I10 Essential (primary) hypertension: Secondary | ICD-10-CM | POA: Diagnosis not present

## 2019-11-11 DIAGNOSIS — B351 Tinea unguium: Secondary | ICD-10-CM | POA: Diagnosis not present

## 2019-11-11 DIAGNOSIS — E1142 Type 2 diabetes mellitus with diabetic polyneuropathy: Secondary | ICD-10-CM | POA: Diagnosis not present

## 2019-11-26 DIAGNOSIS — E1122 Type 2 diabetes mellitus with diabetic chronic kidney disease: Secondary | ICD-10-CM | POA: Diagnosis not present

## 2019-11-26 DIAGNOSIS — E039 Hypothyroidism, unspecified: Secondary | ICD-10-CM | POA: Diagnosis not present

## 2019-11-26 DIAGNOSIS — E782 Mixed hyperlipidemia: Secondary | ICD-10-CM | POA: Diagnosis not present

## 2019-11-26 DIAGNOSIS — N183 Chronic kidney disease, stage 3 unspecified: Secondary | ICD-10-CM | POA: Diagnosis not present

## 2019-12-03 DIAGNOSIS — Z9989 Dependence on other enabling machines and devices: Secondary | ICD-10-CM | POA: Diagnosis not present

## 2019-12-03 DIAGNOSIS — I10 Essential (primary) hypertension: Secondary | ICD-10-CM | POA: Diagnosis not present

## 2019-12-03 DIAGNOSIS — T466X5A Adverse effect of antihyperlipidemic and antiarteriosclerotic drugs, initial encounter: Secondary | ICD-10-CM | POA: Diagnosis not present

## 2019-12-03 DIAGNOSIS — I700 Atherosclerosis of aorta: Secondary | ICD-10-CM | POA: Diagnosis not present

## 2019-12-03 DIAGNOSIS — N183 Chronic kidney disease, stage 3 unspecified: Secondary | ICD-10-CM | POA: Diagnosis not present

## 2019-12-03 DIAGNOSIS — E1122 Type 2 diabetes mellitus with diabetic chronic kidney disease: Secondary | ICD-10-CM | POA: Diagnosis not present

## 2019-12-03 DIAGNOSIS — E039 Hypothyroidism, unspecified: Secondary | ICD-10-CM | POA: Diagnosis not present

## 2019-12-03 DIAGNOSIS — Z0000 Encounter for general adult medical examination without abnormal findings: Secondary | ICD-10-CM | POA: Diagnosis not present

## 2019-12-03 DIAGNOSIS — M791 Myalgia, unspecified site: Secondary | ICD-10-CM | POA: Diagnosis not present

## 2020-01-07 DIAGNOSIS — I872 Venous insufficiency (chronic) (peripheral): Secondary | ICD-10-CM | POA: Diagnosis not present

## 2020-01-07 DIAGNOSIS — L578 Other skin changes due to chronic exposure to nonionizing radiation: Secondary | ICD-10-CM | POA: Diagnosis not present

## 2020-01-07 DIAGNOSIS — L814 Other melanin hyperpigmentation: Secondary | ICD-10-CM | POA: Diagnosis not present

## 2020-01-07 DIAGNOSIS — L570 Actinic keratosis: Secondary | ICD-10-CM | POA: Diagnosis not present

## 2020-01-07 DIAGNOSIS — D229 Melanocytic nevi, unspecified: Secondary | ICD-10-CM | POA: Diagnosis not present

## 2020-01-07 DIAGNOSIS — D1801 Hemangioma of skin and subcutaneous tissue: Secondary | ICD-10-CM | POA: Diagnosis not present

## 2020-01-07 DIAGNOSIS — L821 Other seborrheic keratosis: Secondary | ICD-10-CM | POA: Diagnosis not present

## 2020-01-07 DIAGNOSIS — L820 Inflamed seborrheic keratosis: Secondary | ICD-10-CM | POA: Diagnosis not present

## 2020-01-07 DIAGNOSIS — D225 Melanocytic nevi of trunk: Secondary | ICD-10-CM | POA: Diagnosis not present

## 2020-01-07 DIAGNOSIS — D0461 Carcinoma in situ of skin of right upper limb, including shoulder: Secondary | ICD-10-CM | POA: Diagnosis not present

## 2020-01-07 DIAGNOSIS — Z1283 Encounter for screening for malignant neoplasm of skin: Secondary | ICD-10-CM | POA: Diagnosis not present

## 2020-01-07 DIAGNOSIS — D2230 Melanocytic nevi of unspecified part of face: Secondary | ICD-10-CM | POA: Diagnosis not present

## 2020-01-11 DIAGNOSIS — H903 Sensorineural hearing loss, bilateral: Secondary | ICD-10-CM | POA: Diagnosis not present

## 2020-02-07 IMAGING — CR DG CHEST 2V
1 series · 2 of 2 positions shown · non-contrast
Comparison: October 19, 2018

CLINICAL DATA: Cough and shortness of breath.

EXAM:
CHEST - 2 VIEW

[Series 1: dg chest 2 view · 0.14mm/px · 2 of 2 slices shown]
[im 1/2]
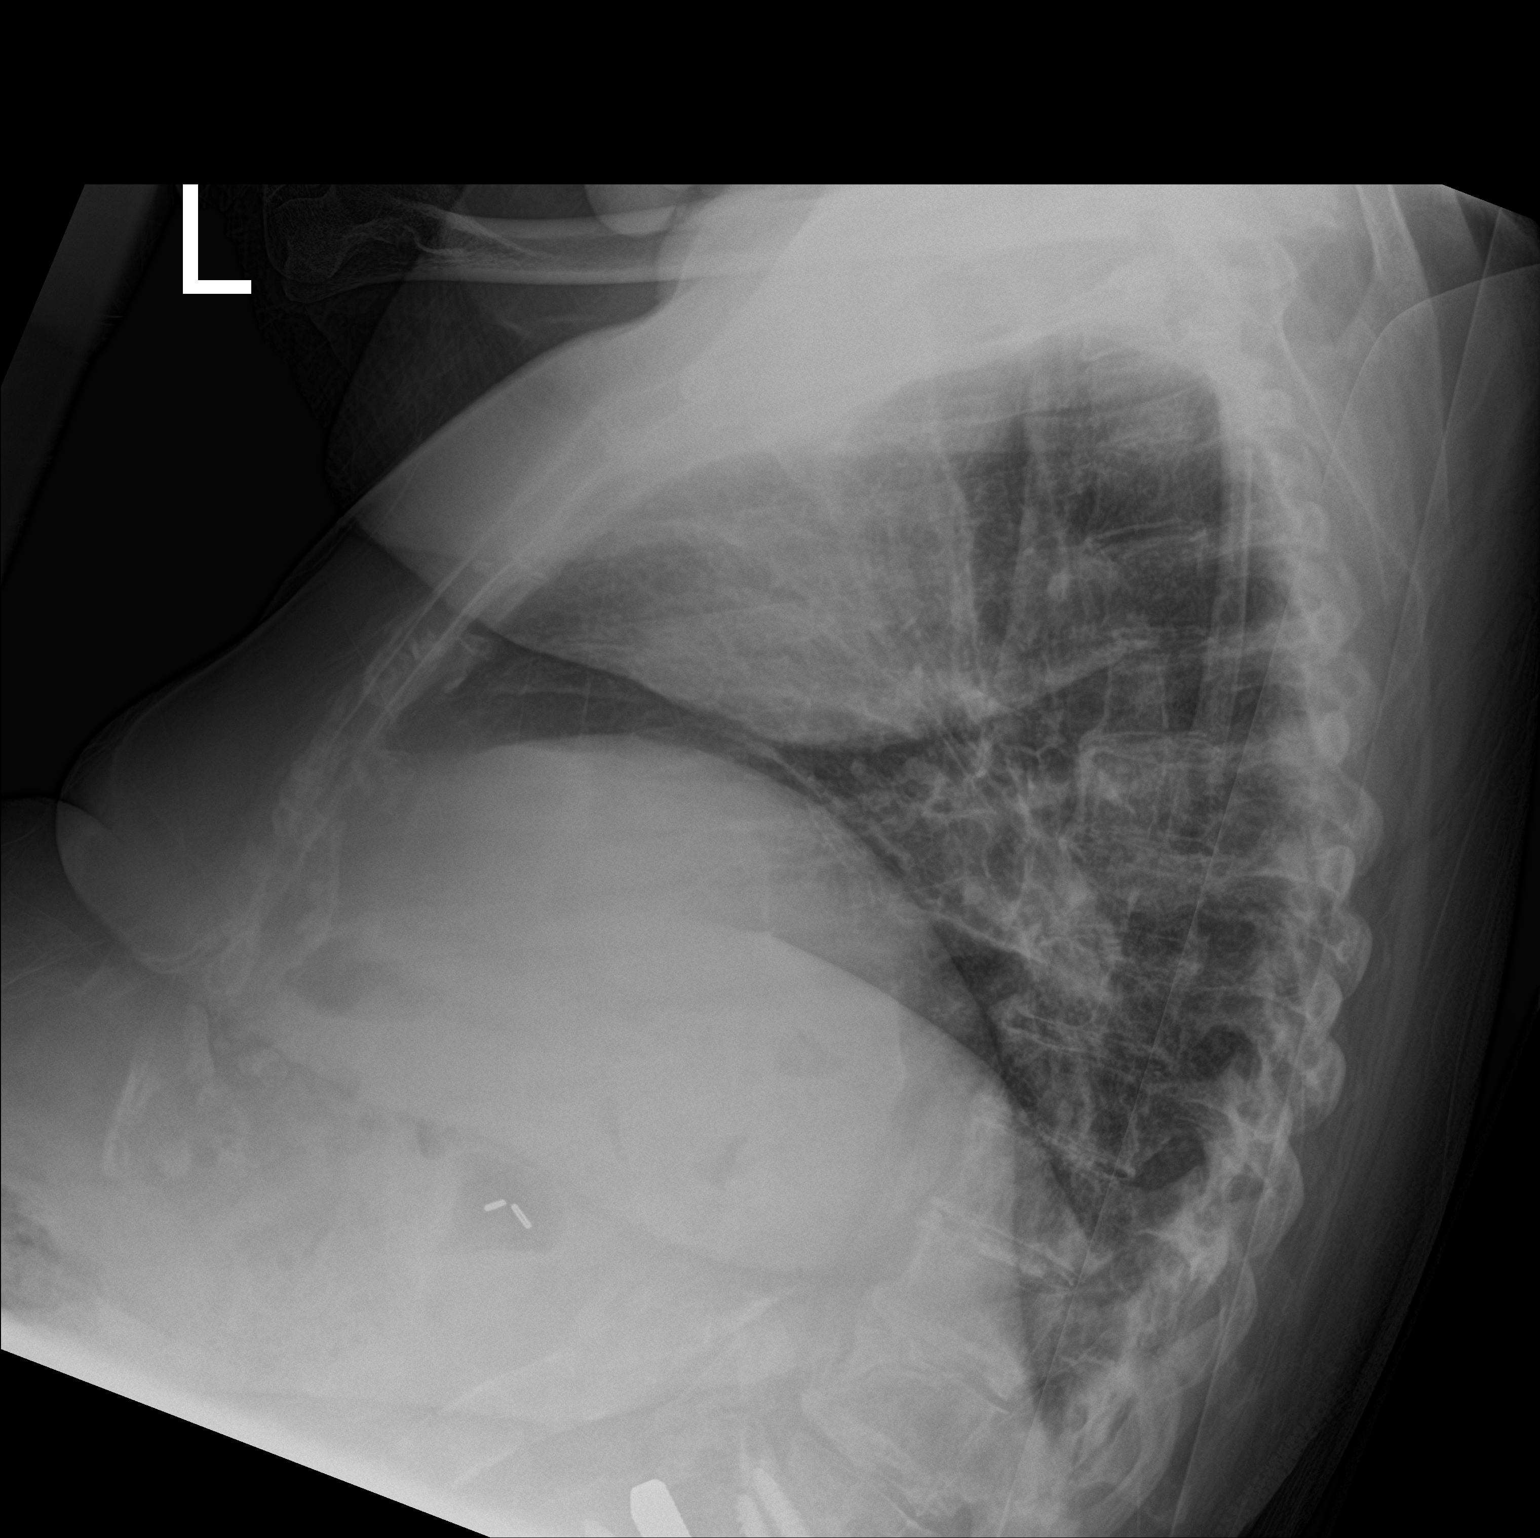
[im 2/2]
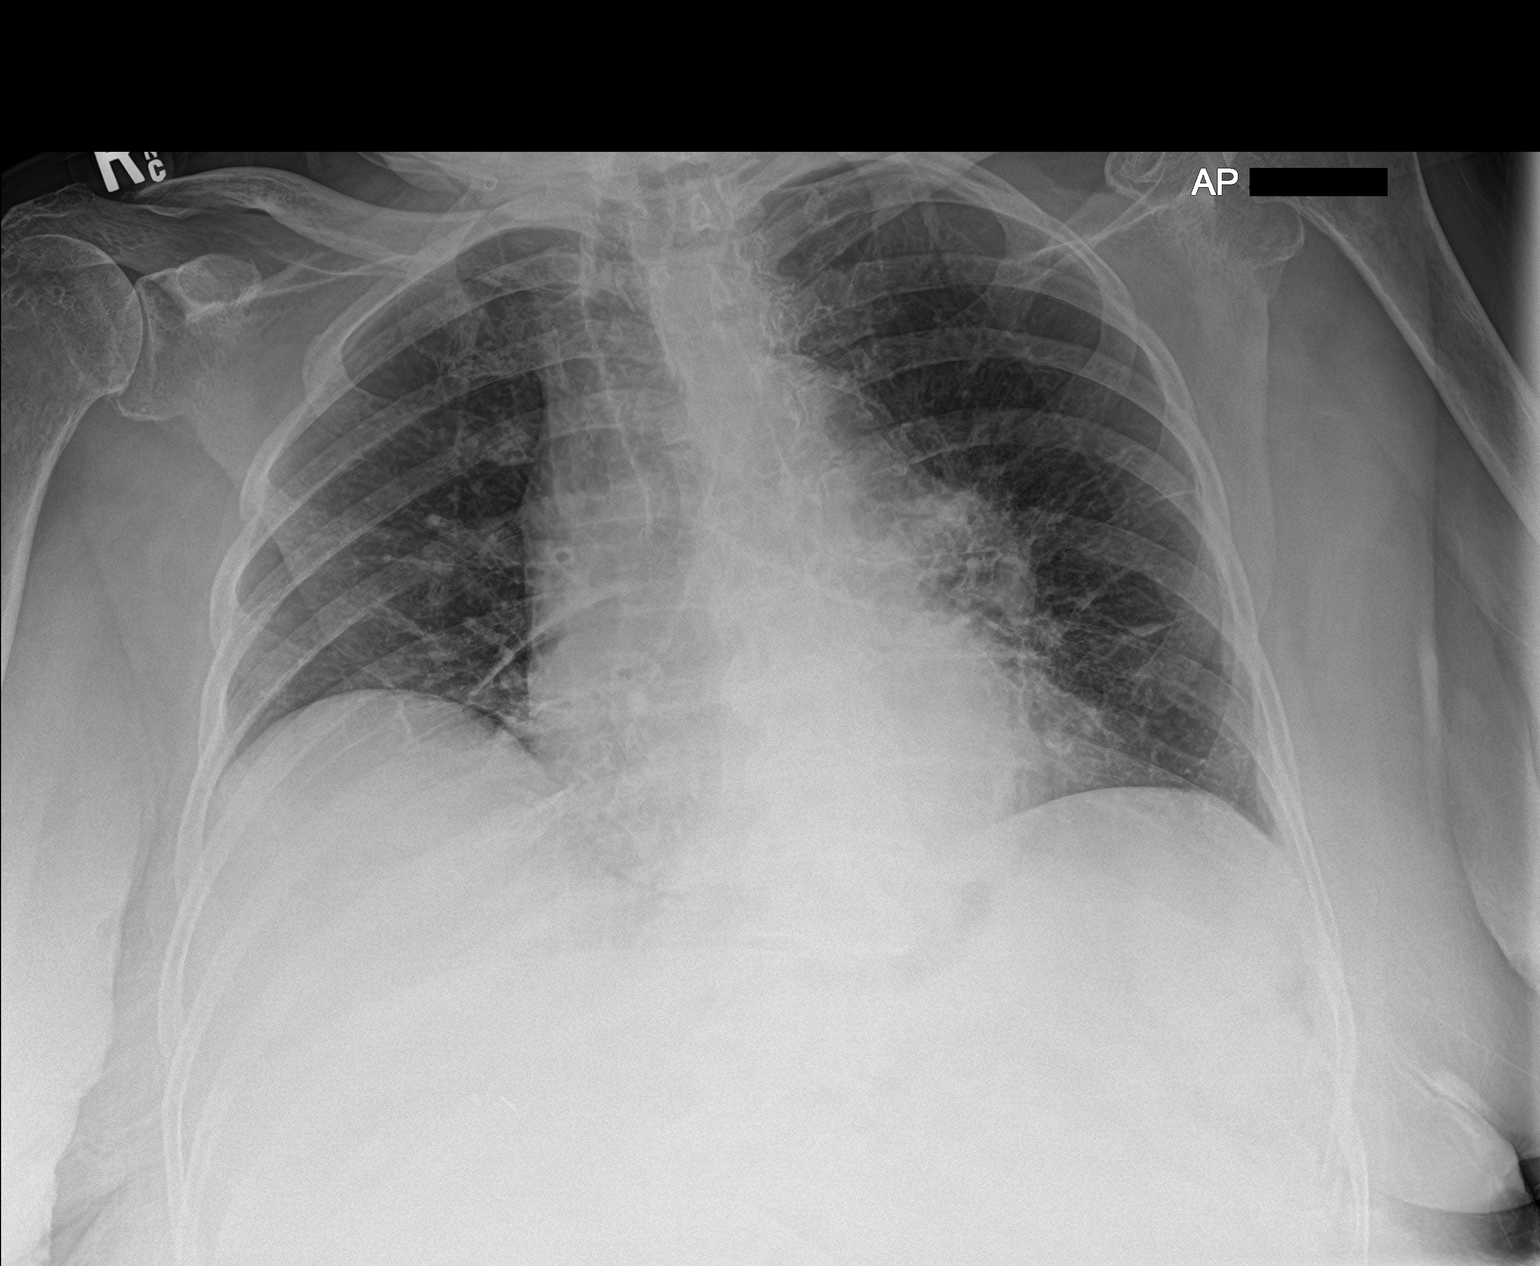

[2 of 2 positions shown; findings below may reference images not displayed]

FINDINGS: The cardiomediastinal silhouette is stable. No pneumothorax. No
nodules or masses. No focal infiltrate. No other change.
IMPRESSION: No active cardiopulmonary disease.

## 2020-02-10 DIAGNOSIS — E1142 Type 2 diabetes mellitus with diabetic polyneuropathy: Secondary | ICD-10-CM | POA: Diagnosis not present

## 2020-02-10 DIAGNOSIS — B351 Tinea unguium: Secondary | ICD-10-CM | POA: Diagnosis not present

## 2020-02-25 DIAGNOSIS — H353132 Nonexudative age-related macular degeneration, bilateral, intermediate dry stage: Secondary | ICD-10-CM | POA: Diagnosis not present

## 2020-03-03 DIAGNOSIS — H16212 Exposure keratoconjunctivitis, left eye: Secondary | ICD-10-CM | POA: Diagnosis not present

## 2020-03-24 DIAGNOSIS — R399 Unspecified symptoms and signs involving the genitourinary system: Secondary | ICD-10-CM | POA: Diagnosis not present

## 2020-03-24 DIAGNOSIS — T466X5A Adverse effect of antihyperlipidemic and antiarteriosclerotic drugs, initial encounter: Secondary | ICD-10-CM | POA: Diagnosis not present

## 2020-03-24 DIAGNOSIS — I10 Essential (primary) hypertension: Secondary | ICD-10-CM | POA: Diagnosis not present

## 2020-03-24 DIAGNOSIS — E1122 Type 2 diabetes mellitus with diabetic chronic kidney disease: Secondary | ICD-10-CM | POA: Diagnosis not present

## 2020-03-24 DIAGNOSIS — N183 Chronic kidney disease, stage 3 unspecified: Secondary | ICD-10-CM | POA: Diagnosis not present

## 2020-03-24 DIAGNOSIS — M791 Myalgia, unspecified site: Secondary | ICD-10-CM | POA: Diagnosis not present

## 2020-03-25 DIAGNOSIS — R8290 Unspecified abnormal findings in urine: Secondary | ICD-10-CM | POA: Diagnosis not present

## 2020-03-29 ENCOUNTER — Other Ambulatory Visit: Payer: Self-pay

## 2020-03-29 ENCOUNTER — Ambulatory Visit: Payer: PPO | Admitting: Dermatology

## 2020-03-29 DIAGNOSIS — D0461 Carcinoma in situ of skin of right upper limb, including shoulder: Secondary | ICD-10-CM | POA: Diagnosis not present

## 2020-03-29 DIAGNOSIS — D099 Carcinoma in situ, unspecified: Secondary | ICD-10-CM

## 2020-03-29 DIAGNOSIS — L578 Other skin changes due to chronic exposure to nonionizing radiation: Secondary | ICD-10-CM | POA: Diagnosis not present

## 2020-03-29 DIAGNOSIS — L821 Other seborrheic keratosis: Secondary | ICD-10-CM | POA: Diagnosis not present

## 2020-03-29 DIAGNOSIS — L820 Inflamed seborrheic keratosis: Secondary | ICD-10-CM | POA: Diagnosis not present

## 2020-03-29 DIAGNOSIS — L570 Actinic keratosis: Secondary | ICD-10-CM | POA: Diagnosis not present

## 2020-03-29 NOTE — Patient Instructions (Signed)

## 2020-03-29 NOTE — Progress Notes (Signed)
Follow-Up Visit   Subjective  Crystal Delacruz is a 86 y.o. female who presents for the following: SCC IS bx proven (R mid dorsum forearm bx 01/07/20), Actinic Keratosis (8 wk f/u face), and ISK (8 wk f/u arms, back).    The following portions of the chart were reviewed this encounter and updated as appropriate:  Tobacco  Allergies  Meds  Problems  Med Hx  Surg Hx  Fam Hx      Review of Systems:  No other skin or systemic complaints except as noted in HPI or Assessment and Plan.  Objective  Well appearing patient in no apparent distress; mood and affect are within normal limits.  A focused examination was performed including upper body and left leg. Relevant physical exam findings are noted in the Assessment and Plan.  Objective  Left cheek x 2, right cheek x 1 (3): Erythematous thin papules/macules with gritty scale.   Objective  Left arm x 2, left calf x 1, back x 6, chest x 3, right ant shoulder x 4, left shoulder x 4 (20): Erythematous keratotic or waxy stuck-on papule or plaque.   Objective  R mid dorsum forearm: Pink bx site   Assessment & Plan    Actinic Damage - diffuse scaly erythematous macules with underlying dyspigmentation - Recommend daily broad spectrum sunscreen SPF 30+ to sun-exposed areas, reapply every 2 hours as needed.  - Call for new or changing lesions.  Seborrheic Keratoses - Stuck-on, waxy, tan-brown papules and plaques  - Discussed benign etiology and prognosis. - Observe - Call for any changes  AK (actinic keratosis) (3) Left cheek x 2, right cheek x 1  Destruction of lesion - Left cheek x 2, right cheek x 1 Complexity: simple   Destruction method: cryotherapy   Informed consent: discussed and consent obtained   Timeout:  patient name, date of birth, surgical site, and procedure verified Lesion destroyed using liquid nitrogen: Yes   Region frozen until ice ball extended beyond lesion: Yes   Outcome: patient tolerated procedure  well with no complications   Post-procedure details: wound care instructions given    Inflamed seborrheic keratosis (20) Left arm x 2, left calf x 1, back x 6, chest x 3, right ant shoulder x 4, left shoulder x 4  Destruction of lesion - Left arm x 2, left calf x 1, back x 6, chest x 3, right ant shoulder x 4, left shoulder x 4 Complexity: simple   Destruction method: cryotherapy   Informed consent: discussed and consent obtained   Timeout:  patient name, date of birth, surgical site, and procedure verified Lesion destroyed using liquid nitrogen: Yes   Region frozen until ice ball extended beyond lesion: Yes   Outcome: patient tolerated procedure well with no complications   Post-procedure details: wound care instructions given    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  Destruction of lesion Complexity: extensive   Destruction method: electrodesiccation and curettage   Informed consent: discussed and consent obtained   Timeout:  patient name, date of birth, surgical site, and procedure verified Procedure prep:  Patient was prepped and draped in usual sterile fashion Prep type:  Isopropyl alcohol Anesthesia: the lesion was anesthetized in a standard fashion   Anesthetic:  1% lidocaine w/ epinephrine 1-100,000 buffered w/ 8.4% NaHCO3 Curettage performed in three different directions: Yes   Electrodesiccation performed over the curetted area: Yes   Lesion length (cm):  2.3 Lesion width (cm):  2.3 Margin per side (cm):  0.2 Final wound size (cm):  2.7 Hemostasis achieved with:  pressure and aluminum chloride Outcome: patient tolerated procedure well with no complications   Post-procedure details: sterile dressing applied and wound care instructions given   Dressing type: bandage and petrolatum    Return in about 6 months (around 09/29/2020).   I, Ashok Cordia, CMA, am acting as scribe for Sarina Ser, MD . Documentation: I have reviewed the above  documentation for accuracy and completeness, and I agree with the above.  Sarina Ser, MD

## 2020-03-30 ENCOUNTER — Encounter: Payer: Self-pay | Admitting: Dermatology

## 2020-03-31 DIAGNOSIS — E782 Mixed hyperlipidemia: Secondary | ICD-10-CM | POA: Diagnosis not present

## 2020-03-31 DIAGNOSIS — Z9989 Dependence on other enabling machines and devices: Secondary | ICD-10-CM | POA: Diagnosis not present

## 2020-03-31 DIAGNOSIS — E1122 Type 2 diabetes mellitus with diabetic chronic kidney disease: Secondary | ICD-10-CM | POA: Diagnosis not present

## 2020-03-31 DIAGNOSIS — R300 Dysuria: Secondary | ICD-10-CM | POA: Diagnosis not present

## 2020-03-31 DIAGNOSIS — N183 Chronic kidney disease, stage 3 unspecified: Secondary | ICD-10-CM | POA: Diagnosis not present

## 2020-03-31 DIAGNOSIS — T466X5A Adverse effect of antihyperlipidemic and antiarteriosclerotic drugs, initial encounter: Secondary | ICD-10-CM | POA: Diagnosis not present

## 2020-03-31 DIAGNOSIS — M791 Myalgia, unspecified site: Secondary | ICD-10-CM | POA: Diagnosis not present

## 2020-03-31 DIAGNOSIS — E039 Hypothyroidism, unspecified: Secondary | ICD-10-CM | POA: Diagnosis not present

## 2020-03-31 DIAGNOSIS — I700 Atherosclerosis of aorta: Secondary | ICD-10-CM | POA: Diagnosis not present

## 2020-03-31 DIAGNOSIS — I10 Essential (primary) hypertension: Secondary | ICD-10-CM | POA: Diagnosis not present

## 2020-05-16 DIAGNOSIS — B351 Tinea unguium: Secondary | ICD-10-CM | POA: Diagnosis not present

## 2020-05-16 DIAGNOSIS — E1142 Type 2 diabetes mellitus with diabetic polyneuropathy: Secondary | ICD-10-CM | POA: Diagnosis not present

## 2020-05-17 DIAGNOSIS — N183 Chronic kidney disease, stage 3 unspecified: Secondary | ICD-10-CM | POA: Diagnosis not present

## 2020-05-17 DIAGNOSIS — I700 Atherosclerosis of aorta: Secondary | ICD-10-CM | POA: Diagnosis not present

## 2020-05-17 DIAGNOSIS — E1122 Type 2 diabetes mellitus with diabetic chronic kidney disease: Secondary | ICD-10-CM | POA: Diagnosis not present

## 2020-05-17 DIAGNOSIS — I10 Essential (primary) hypertension: Secondary | ICD-10-CM | POA: Diagnosis not present

## 2020-06-02 DIAGNOSIS — H401132 Primary open-angle glaucoma, bilateral, moderate stage: Secondary | ICD-10-CM | POA: Diagnosis not present

## 2020-06-27 DIAGNOSIS — N183 Chronic kidney disease, stage 3 unspecified: Secondary | ICD-10-CM | POA: Diagnosis not present

## 2020-06-27 DIAGNOSIS — E1122 Type 2 diabetes mellitus with diabetic chronic kidney disease: Secondary | ICD-10-CM | POA: Diagnosis not present

## 2020-06-27 DIAGNOSIS — E782 Mixed hyperlipidemia: Secondary | ICD-10-CM | POA: Diagnosis not present

## 2020-07-04 DIAGNOSIS — E039 Hypothyroidism, unspecified: Secondary | ICD-10-CM | POA: Diagnosis not present

## 2020-07-04 DIAGNOSIS — E1122 Type 2 diabetes mellitus with diabetic chronic kidney disease: Secondary | ICD-10-CM | POA: Diagnosis not present

## 2020-07-04 DIAGNOSIS — Z9989 Dependence on other enabling machines and devices: Secondary | ICD-10-CM | POA: Diagnosis not present

## 2020-07-04 DIAGNOSIS — J40 Bronchitis, not specified as acute or chronic: Secondary | ICD-10-CM | POA: Diagnosis not present

## 2020-07-04 DIAGNOSIS — I10 Essential (primary) hypertension: Secondary | ICD-10-CM | POA: Diagnosis not present

## 2020-07-04 DIAGNOSIS — N183 Chronic kidney disease, stage 3 unspecified: Secondary | ICD-10-CM | POA: Diagnosis not present

## 2020-07-04 DIAGNOSIS — I700 Atherosclerosis of aorta: Secondary | ICD-10-CM | POA: Diagnosis not present

## 2020-07-22 DIAGNOSIS — R300 Dysuria: Secondary | ICD-10-CM | POA: Diagnosis not present

## 2020-08-19 DIAGNOSIS — E1142 Type 2 diabetes mellitus with diabetic polyneuropathy: Secondary | ICD-10-CM | POA: Diagnosis not present

## 2020-08-19 DIAGNOSIS — B351 Tinea unguium: Secondary | ICD-10-CM | POA: Diagnosis not present

## 2020-09-21 ENCOUNTER — Other Ambulatory Visit: Payer: Self-pay

## 2020-09-21 ENCOUNTER — Ambulatory Visit: Payer: PPO | Admitting: Dermatology

## 2020-09-21 DIAGNOSIS — M2570 Osteophyte, unspecified joint: Secondary | ICD-10-CM | POA: Diagnosis not present

## 2020-09-21 DIAGNOSIS — L820 Inflamed seborrheic keratosis: Secondary | ICD-10-CM

## 2020-09-21 DIAGNOSIS — L304 Erythema intertrigo: Secondary | ICD-10-CM

## 2020-09-21 DIAGNOSIS — I872 Venous insufficiency (chronic) (peripheral): Secondary | ICD-10-CM

## 2020-09-21 DIAGNOSIS — L570 Actinic keratosis: Secondary | ICD-10-CM | POA: Diagnosis not present

## 2020-09-21 MED ORDER — TRIAMCINOLONE ACETONIDE 0.1 % EX CREA: TOPICAL_CREAM | CUTANEOUS | 3 refills | Status: DC

## 2020-09-21 MED ORDER — KETOCONAZOLE 2 % EX CREA: TOPICAL_CREAM | CUTANEOUS | 2 refills | Status: DC

## 2020-09-21 NOTE — Progress Notes (Signed)
   Follow-Up Visit   Subjective  Crystal Delacruz is a 86 y.o. female who presents for the following: Skin Problem (Check spots on the arms, legs, growing, irritating and not going away ), growth (Pt c/o growth on the R scalp appeared after pt fell 2 months ago and hit her head ), and Rash (red rash on her lower legs and beneath her breast ).  Husband with pt contributing to history   The following portions of the chart were reviewed this encounter and updated as appropriate:  Tobacco  Allergies  Meds  Problems  Med Hx  Surg Hx  Fam Hx     Review of Systems:  No other skin or systemic complaints except as noted in HPI or Assessment and Plan.  Objective   Well appearing patient in no apparent distress; mood and affect are within normal limits. A full examination was performed including scalp, head, eyes, ears, nose, lips, neck, chest, axillae, abdomen, back, buttocks, bilateral upper extremities, bilateral lower extremities, hands, feet, fingers, toes, fingernails, and toenails. All findings within normal limits unless otherwise noted below."}  Objective  arms x 4, inframammary x 16 (20): Erythematous keratotic or waxy stuck-on papule or plaque.   Objective  R wrist x 1: Erythematous thin papules/macules with gritty scale.   Objective  Right Abdomen (side) - Upper: erythema  Objective  lower legs: Erythematous, scaly patches involving the ankle and distal lower leg with associated lower leg edema.   Objective  R scalp: Firm papule    Assessment & Plan  Inflamed seborrheic keratosis (20) arms x 4, inframammary x 16  Destruction of lesion - arms x 4, inframammary x 16 Complexity: simple   Destruction method: cryotherapy   Informed consent: discussed and consent obtained   Timeout:  patient name, date of birth, surgical site, and procedure verified Lesion destroyed using liquid nitrogen: Yes   Region frozen until ice ball extended beyond lesion: Yes   Outcome: patient  tolerated procedure well with no complications   Post-procedure details: wound care instructions given    AK (actinic keratosis) R wrist x 1  Destruction of lesion - R wrist x 1 Complexity: simple   Destruction method: cryotherapy   Informed consent: discussed and consent obtained   Timeout:  patient name, date of birth, surgical site, and procedure verified Lesion destroyed using liquid nitrogen: Yes   Region frozen until ice ball extended beyond lesion: Yes   Outcome: patient tolerated procedure well with no complications   Post-procedure details: wound care instructions given    Erythema intertrigo Right Abdomen (side) - Upper Intertrigo is a chronic recurrent rash that occurs in skin fold areas that may be associated with friction; heat; moisture; yeast; fungus; and bacteria.  It is exacerbated by increased movement / activity; sweating; and higher atmospheric temperature.   Start Ketoconazole   Ordered Medications: ketoconazole (NIZORAL) 2 % cream  Venous stasis dermatitis of left lower extremity - chronic; persistent lower legs Start TMC ointment apply to legs in the morning  Start wearing graduated compressions daily every morning  Keep legs elevated   Ordered Medications: triamcinolone (KENALOG) 0.1 %  Bone callus R scalp Recommend patient consult with her PCP   Return in about 4 months (around 01/19/2021).  IMarye Round, CMA, am acting as scribe for Sarina Ser, MD .  Documentation: I have reviewed the above documentation for accuracy and completeness, and I agree with the above.  Sarina Ser, MD

## 2020-09-21 NOTE — Patient Instructions (Addendum)
Cryotherapy Aftercare  . Wash gently with soap and water everyday.   Marland Kitchen Apply Vaseline and Band-Aid daily until healed.   Keep legs elevated   Wear graduated compression stockings daily

## 2020-09-26 ENCOUNTER — Encounter: Payer: Self-pay | Admitting: Dermatology

## 2020-09-27 DIAGNOSIS — H353132 Nonexudative age-related macular degeneration, bilateral, intermediate dry stage: Secondary | ICD-10-CM | POA: Diagnosis not present

## 2020-11-28 DIAGNOSIS — B351 Tinea unguium: Secondary | ICD-10-CM | POA: Diagnosis not present

## 2020-11-28 DIAGNOSIS — E039 Hypothyroidism, unspecified: Secondary | ICD-10-CM | POA: Diagnosis not present

## 2020-11-28 DIAGNOSIS — I10 Essential (primary) hypertension: Secondary | ICD-10-CM | POA: Diagnosis not present

## 2020-11-28 DIAGNOSIS — E1142 Type 2 diabetes mellitus with diabetic polyneuropathy: Secondary | ICD-10-CM | POA: Diagnosis not present

## 2020-11-28 DIAGNOSIS — E1122 Type 2 diabetes mellitus with diabetic chronic kidney disease: Secondary | ICD-10-CM | POA: Diagnosis not present

## 2020-11-28 DIAGNOSIS — I739 Peripheral vascular disease, unspecified: Secondary | ICD-10-CM | POA: Diagnosis not present

## 2020-11-28 DIAGNOSIS — N183 Chronic kidney disease, stage 3 unspecified: Secondary | ICD-10-CM | POA: Diagnosis not present

## 2020-12-05 DIAGNOSIS — I10 Essential (primary) hypertension: Secondary | ICD-10-CM | POA: Diagnosis not present

## 2020-12-05 DIAGNOSIS — N183 Chronic kidney disease, stage 3 unspecified: Secondary | ICD-10-CM | POA: Diagnosis not present

## 2020-12-05 DIAGNOSIS — I700 Atherosclerosis of aorta: Secondary | ICD-10-CM | POA: Diagnosis not present

## 2020-12-05 DIAGNOSIS — E782 Mixed hyperlipidemia: Secondary | ICD-10-CM | POA: Diagnosis not present

## 2020-12-05 DIAGNOSIS — E039 Hypothyroidism, unspecified: Secondary | ICD-10-CM | POA: Diagnosis not present

## 2020-12-05 DIAGNOSIS — E1122 Type 2 diabetes mellitus with diabetic chronic kidney disease: Secondary | ICD-10-CM | POA: Diagnosis not present

## 2020-12-07 DIAGNOSIS — M3501 Sicca syndrome with keratoconjunctivitis: Secondary | ICD-10-CM | POA: Diagnosis not present

## 2020-12-08 DIAGNOSIS — I10 Essential (primary) hypertension: Secondary | ICD-10-CM | POA: Diagnosis not present

## 2020-12-08 DIAGNOSIS — E1122 Type 2 diabetes mellitus with diabetic chronic kidney disease: Secondary | ICD-10-CM | POA: Diagnosis not present

## 2020-12-08 DIAGNOSIS — E039 Hypothyroidism, unspecified: Secondary | ICD-10-CM | POA: Diagnosis not present

## 2020-12-08 DIAGNOSIS — I700 Atherosclerosis of aorta: Secondary | ICD-10-CM | POA: Diagnosis not present

## 2020-12-08 DIAGNOSIS — T466X5A Adverse effect of antihyperlipidemic and antiarteriosclerotic drugs, initial encounter: Secondary | ICD-10-CM | POA: Diagnosis not present

## 2020-12-08 DIAGNOSIS — N183 Chronic kidney disease, stage 3 unspecified: Secondary | ICD-10-CM | POA: Diagnosis not present

## 2020-12-08 DIAGNOSIS — Z0000 Encounter for general adult medical examination without abnormal findings: Secondary | ICD-10-CM | POA: Diagnosis not present

## 2020-12-08 DIAGNOSIS — M791 Myalgia, unspecified site: Secondary | ICD-10-CM | POA: Diagnosis not present

## 2020-12-08 DIAGNOSIS — Z9989 Dependence on other enabling machines and devices: Secondary | ICD-10-CM | POA: Diagnosis not present

## 2020-12-19 ENCOUNTER — Ambulatory Visit: Payer: PPO | Admitting: Dermatology

## 2020-12-19 ENCOUNTER — Other Ambulatory Visit: Payer: Self-pay

## 2020-12-19 DIAGNOSIS — D692 Other nonthrombocytopenic purpura: Secondary | ICD-10-CM | POA: Diagnosis not present

## 2020-12-19 DIAGNOSIS — S80812A Abrasion, left lower leg, initial encounter: Secondary | ICD-10-CM

## 2020-12-19 DIAGNOSIS — D0462 Carcinoma in situ of skin of left upper limb, including shoulder: Secondary | ICD-10-CM | POA: Diagnosis not present

## 2020-12-19 DIAGNOSIS — I872 Venous insufficiency (chronic) (peripheral): Secondary | ICD-10-CM | POA: Diagnosis not present

## 2020-12-19 DIAGNOSIS — L859 Epidermal thickening, unspecified: Secondary | ICD-10-CM | POA: Diagnosis not present

## 2020-12-19 DIAGNOSIS — L578 Other skin changes due to chronic exposure to nonionizing radiation: Secondary | ICD-10-CM | POA: Diagnosis not present

## 2020-12-19 DIAGNOSIS — L820 Inflamed seborrheic keratosis: Secondary | ICD-10-CM

## 2020-12-19 DIAGNOSIS — D485 Neoplasm of uncertain behavior of skin: Secondary | ICD-10-CM

## 2020-12-19 DIAGNOSIS — L304 Erythema intertrigo: Secondary | ICD-10-CM

## 2020-12-19 DIAGNOSIS — T148XXA Other injury of unspecified body region, initial encounter: Secondary | ICD-10-CM

## 2020-12-19 MED ORDER — MUPIROCIN 2 % EX OINT: 1.0000 "application " | TOPICAL_OINTMENT | Freq: Every day | CUTANEOUS | 2 refills | Status: DC

## 2020-12-19 NOTE — Progress Notes (Unsigned)
Follow-Up Visit   Subjective  Crystal Delacruz is a 87 y.o. female who presents for the following: Skin Problem (Patient with areas at lower legs that she is concerned about. Areas have been present for a few months and she has been applying TMC 0.1% to the areas. ).  The following portions of the chart were reviewed this encounter and updated as appropriate:   Tobacco  Allergies  Meds  Problems  Med Hx  Surg Hx  Fam Hx     Review of Systems:  No other skin or systemic complaints except as noted in HPI or Assessment and Plan.  Objective  Well appearing patient in no apparent distress; mood and affect are within normal limits.  A focused examination was performed including face, arms, legs, breast. Relevant physical exam findings are noted in the Assessment and Plan.  Objective  Left Lower pretibial: 1.5 x 1.0cm abrasion  Objective  Bilateral Lower Legs: Edema with stasis changes  Objective  Bilateral feet: hyperkeratosis  Objective  Left Forearm - Posterior: Cutaneous horn 0.6cm  Objective  Left Forearm x 4, right breast x 1, right arm x 12 (17): Erythematous keratotic or waxy stuck-on papule or plaque.   Objective  Left Inframammary Fold: clear     Assessment & Plan  Abrasion from Trauma  Left Lower pretibial Start mupirocin daily to affected area and wrap.  Cleansed and re-dressed today.  Ordered Medications: mupirocin ointment (BACTROBAN) 2 %  Venous stasis dermatitis of right & Left lower extremity Bilateral Lower Legs Recommend leg elevation, compression stockings D/C TMC 0.1% ointment.   Hyperkeratosis Bilateral feet Recommend AmLactin to feet daily Getting Edema down will help too  Neoplasm of uncertain behavior of skin Left Forearm - Posterior  Epidermal / dermal shaving  Lesion diameter (cm):  0.6 Informed consent: discussed and consent obtained   Timeout: patient name, date of birth, surgical site, and procedure verified   Procedure  prep:  Patient was prepped and draped in usual sterile fashion Prep type:  Isopropyl alcohol Anesthesia: the lesion was anesthetized in a standard fashion   Anesthetic:  1% lidocaine w/ epinephrine 1-100,000 buffered w/ 8.4% NaHCO3 Instrument used: flexible razor blade   Hemostasis achieved with: pressure, aluminum chloride and electrodesiccation   Outcome: patient tolerated procedure well   Post-procedure details: sterile dressing applied and wound care instructions given   Dressing type: bandage and petrolatum    Destruction of lesion Complexity: extensive   Destruction method: electrodesiccation and curettage   Informed consent: discussed and consent obtained   Timeout:  patient name, date of birth, surgical site, and procedure verified Procedure prep:  Patient was prepped and draped in usual sterile fashion Prep type:  Isopropyl alcohol Anesthesia: the lesion was anesthetized in a standard fashion   Anesthetic:  1% lidocaine w/ epinephrine 1-100,000 buffered w/ 8.4% NaHCO3 Curettage performed in three different directions: Yes   Electrodesiccation performed over the curetted area: Yes   Lesion length (cm):  0.6 Lesion width (cm):  0.6 Margin per side (cm):  0.2 Final wound size (cm):  1 Hemostasis achieved with:  pressure, aluminum chloride and electrodesiccation Outcome: patient tolerated procedure well with no complications   Post-procedure details: sterile dressing applied and wound care instructions given   Dressing type: bandage and petrolatum    Specimen 1 - Surgical pathology Differential Diagnosis: r/o SCC  Check Margins: No Cutaneous horn 0.6cm  Inflamed seborrheic keratosis (17) Left Forearm x 4, right breast x 1, right arm x  12  Destruction of lesion - Left Forearm x 4, right breast x 1, right arm x 12 Complexity: simple   Destruction method: cryotherapy   Informed consent: discussed and consent obtained   Timeout:  patient name, date of birth, surgical site,  and procedure verified Lesion destroyed using liquid nitrogen: Yes   Region frozen until ice ball extended beyond lesion: Yes   Outcome: patient tolerated procedure well with no complications   Post-procedure details: wound care instructions given    Erythema intertrigo Left Inframammary Fold improving Intertrigo is a chronic recurrent rash that occurs in skin fold areas that may be associated with friction; heat; moisture; yeast; fungus; and bacteria.  It is exacerbated by increased movement / activity; sweating; and higher atmospheric temperature.  Continue ketoconazole 2% cream as needed for rash to areas under breast.  Other Related Medications ketoconazole (NIZORAL) 2 % cream   Purpura - Chronic; persistent and recurrent.  Treatable, but not curable. - Violaceous macules and patches - Benign - Related to trauma, age, sun damage and/or use of blood thinners, chronic use of topical and/or oral steroids - Observe - Can use OTC arnica containing moisturizer such as Dermend Bruise Formula if desired - Call for worsening or other concerns  Actinic Damage - chronic, secondary to cumulative UV radiation exposure/sun exposure over time - diffuse scaly erythematous macules with underlying dyspigmentation - Recommend daily broad spectrum sunscreen SPF 30+ to sun-exposed areas, reapply every 2 hours as needed.  - Call for new or changing lesions.  Return in about 6 months (around 06/18/2021).  Graciella Belton, RMA, am acting as scribe for Sarina Ser, MD . Documentation: I have reviewed the above documentation for accuracy and completeness, and I agree with the above.  Sarina Ser, MD

## 2020-12-19 NOTE — Patient Instructions (Addendum)
Wound Care Instructions  1. Cleanse wound gently with soap and water once a day then pat dry with clean gauze. Apply a thing coat of Petrolatum (petroleum jelly, "Vaseline") over the wound (unless you have an allergy to this). We recommend that you use a new, sterile tube of Vaseline. Do not pick or remove scabs. Do not remove the yellow or white "healing tissue" from the base of the wound.  2. Cover the wound with fresh, clean, nonstick gauze and secure with paper tape. You may use Band-Aids in place of gauze and tape if the would is small enough, but would recommend trimming much of the tape off as there is often too much. Sometimes Band-Aids can irritate the skin.  3. You should call the office for your biopsy report after 1 week if you have not already been contacted.  4. If you experience any problems, such as abnormal amounts of bleeding, swelling, significant bruising, significant pain, or evidence of infection, please call the office immediately.  5. FOR ADULT SURGERY PATIENTS: If you need something for pain relief you may take 1 extra strength Tylenol (acetaminophen) AND 2 Ibuprofen (200mg  each) together every 4 hours as needed for pain. (do not take these if you are allergic to them or if you have a reason you should not take them.) Typically, you may only need pain medication for 1 to 3 days.    Cryotherapy Aftercare  . Wash gently with soap and water everyday.   Marland Kitchen Apply Vaseline and Band-Aid daily until healed.  Continue ketoconazole 2% cream as needed for rash to areas under breast.  Discontinue triamcinolone 0.1% ointment to lower legs.  Recommend leg elevation and compression stockings.   Start mupirocin ointment to open area at legs and cover with bandage.   Recommend over the counter AmLactin (ammonium lactate) to both feet once daily.

## 2020-12-20 ENCOUNTER — Encounter: Payer: Self-pay | Admitting: Dermatology

## 2020-12-21 ENCOUNTER — Telehealth: Payer: Self-pay

## 2020-12-21 NOTE — Telephone Encounter (Signed)
Advised patient of results/hd  

## 2020-12-21 NOTE — Telephone Encounter (Signed)
-----   Message from Ralene Bathe, MD sent at 12/20/2020  6:35 PM EST ----- Diagnosis Skin , left forearm-posterior SQUAMOUS CELL CARCINOMA IN SITU, HYPERTROPHIC  Cancer - SCC in situ Already treated Recheck next visit

## 2021-01-23 ENCOUNTER — Ambulatory Visit: Payer: PPO | Admitting: Dermatology

## 2021-03-01 DIAGNOSIS — E1142 Type 2 diabetes mellitus with diabetic polyneuropathy: Secondary | ICD-10-CM | POA: Diagnosis not present

## 2021-03-01 DIAGNOSIS — B351 Tinea unguium: Secondary | ICD-10-CM | POA: Diagnosis not present

## 2021-03-14 DIAGNOSIS — B351 Tinea unguium: Secondary | ICD-10-CM | POA: Diagnosis not present

## 2021-03-14 DIAGNOSIS — E1142 Type 2 diabetes mellitus with diabetic polyneuropathy: Secondary | ICD-10-CM | POA: Diagnosis not present

## 2021-03-14 DIAGNOSIS — M79674 Pain in right toe(s): Secondary | ICD-10-CM | POA: Diagnosis not present

## 2021-03-14 DIAGNOSIS — I739 Peripheral vascular disease, unspecified: Secondary | ICD-10-CM | POA: Diagnosis not present

## 2021-03-14 DIAGNOSIS — M79675 Pain in left toe(s): Secondary | ICD-10-CM | POA: Diagnosis not present

## 2021-03-14 DIAGNOSIS — M7989 Other specified soft tissue disorders: Secondary | ICD-10-CM | POA: Diagnosis not present

## 2021-03-30 DIAGNOSIS — E039 Hypothyroidism, unspecified: Secondary | ICD-10-CM | POA: Diagnosis not present

## 2021-03-30 DIAGNOSIS — E1122 Type 2 diabetes mellitus with diabetic chronic kidney disease: Secondary | ICD-10-CM | POA: Diagnosis not present

## 2021-03-30 DIAGNOSIS — N183 Chronic kidney disease, stage 3 unspecified: Secondary | ICD-10-CM | POA: Diagnosis not present

## 2021-03-30 DIAGNOSIS — I10 Essential (primary) hypertension: Secondary | ICD-10-CM | POA: Diagnosis not present

## 2021-04-04 DIAGNOSIS — I10 Essential (primary) hypertension: Secondary | ICD-10-CM | POA: Diagnosis not present

## 2021-04-04 DIAGNOSIS — E039 Hypothyroidism, unspecified: Secondary | ICD-10-CM | POA: Diagnosis not present

## 2021-04-04 DIAGNOSIS — E1122 Type 2 diabetes mellitus with diabetic chronic kidney disease: Secondary | ICD-10-CM | POA: Diagnosis not present

## 2021-04-04 DIAGNOSIS — N183 Chronic kidney disease, stage 3 unspecified: Secondary | ICD-10-CM | POA: Diagnosis not present

## 2021-04-05 DIAGNOSIS — E039 Hypothyroidism, unspecified: Secondary | ICD-10-CM | POA: Diagnosis not present

## 2021-04-05 DIAGNOSIS — I10 Essential (primary) hypertension: Secondary | ICD-10-CM | POA: Diagnosis not present

## 2021-04-05 DIAGNOSIS — I700 Atherosclerosis of aorta: Secondary | ICD-10-CM | POA: Diagnosis not present

## 2021-04-05 DIAGNOSIS — E782 Mixed hyperlipidemia: Secondary | ICD-10-CM | POA: Diagnosis not present

## 2021-04-05 DIAGNOSIS — N183 Chronic kidney disease, stage 3 unspecified: Secondary | ICD-10-CM | POA: Diagnosis not present

## 2021-04-05 DIAGNOSIS — E1122 Type 2 diabetes mellitus with diabetic chronic kidney disease: Secondary | ICD-10-CM | POA: Diagnosis not present

## 2021-04-05 DIAGNOSIS — M791 Myalgia, unspecified site: Secondary | ICD-10-CM | POA: Diagnosis not present

## 2021-04-06 DIAGNOSIS — H401112 Primary open-angle glaucoma, right eye, moderate stage: Secondary | ICD-10-CM | POA: Diagnosis not present

## 2021-04-06 DIAGNOSIS — H353132 Nonexudative age-related macular degeneration, bilateral, intermediate dry stage: Secondary | ICD-10-CM | POA: Diagnosis not present

## 2021-05-17 DIAGNOSIS — N183 Chronic kidney disease, stage 3 unspecified: Secondary | ICD-10-CM | POA: Diagnosis not present

## 2021-05-17 DIAGNOSIS — E782 Mixed hyperlipidemia: Secondary | ICD-10-CM | POA: Diagnosis not present

## 2021-05-17 DIAGNOSIS — E1122 Type 2 diabetes mellitus with diabetic chronic kidney disease: Secondary | ICD-10-CM | POA: Diagnosis not present

## 2021-05-17 DIAGNOSIS — I10 Essential (primary) hypertension: Secondary | ICD-10-CM | POA: Diagnosis not present

## 2021-06-21 ENCOUNTER — Ambulatory Visit: Payer: PPO | Admitting: Dermatology

## 2021-06-21 ENCOUNTER — Encounter: Payer: Self-pay | Admitting: Dermatology

## 2021-06-21 ENCOUNTER — Other Ambulatory Visit: Payer: Self-pay

## 2021-06-21 DIAGNOSIS — L97822 Non-pressure chronic ulcer of other part of left lower leg with fat layer exposed: Secondary | ICD-10-CM | POA: Diagnosis not present

## 2021-06-21 DIAGNOSIS — C44622 Squamous cell carcinoma of skin of right upper limb, including shoulder: Secondary | ICD-10-CM | POA: Diagnosis not present

## 2021-06-21 DIAGNOSIS — L820 Inflamed seborrheic keratosis: Secondary | ICD-10-CM | POA: Diagnosis not present

## 2021-06-21 DIAGNOSIS — D0461 Carcinoma in situ of skin of right upper limb, including shoulder: Secondary | ICD-10-CM

## 2021-06-21 DIAGNOSIS — D492 Neoplasm of unspecified behavior of bone, soft tissue, and skin: Secondary | ICD-10-CM

## 2021-06-21 DIAGNOSIS — L570 Actinic keratosis: Secondary | ICD-10-CM | POA: Diagnosis not present

## 2021-06-21 DIAGNOSIS — Z85828 Personal history of other malignant neoplasm of skin: Secondary | ICD-10-CM | POA: Diagnosis not present

## 2021-06-21 DIAGNOSIS — L578 Other skin changes due to chronic exposure to nonionizing radiation: Secondary | ICD-10-CM

## 2021-06-21 DIAGNOSIS — I872 Venous insufficiency (chronic) (peripheral): Secondary | ICD-10-CM | POA: Diagnosis not present

## 2021-06-21 DIAGNOSIS — L97821 Non-pressure chronic ulcer of other part of left lower leg limited to breakdown of skin: Secondary | ICD-10-CM | POA: Diagnosis not present

## 2021-06-21 DIAGNOSIS — L98491 Non-pressure chronic ulcer of skin of other sites limited to breakdown of skin: Secondary | ICD-10-CM

## 2021-06-21 NOTE — Progress Notes (Signed)
Follow-Up Visit   Subjective  Crystal Delacruz is a 87 y.o. female who presents for the following: check spot (R upper arm, sore, not treating,), stasis derm (Bil lower legs, compression socks prn), and hx of Intertrigo (L inframammary, ketoconazole 2% cr orb).  Patient accompanied by husband, who contributes to history.  The following portions of the chart were reviewed this encounter and updated as appropriate:   Tobacco  Allergies  Meds  Problems  Med Hx  Surg Hx  Fam Hx     Review of Systems:  No other skin or systemic complaints except as noted in HPI or Assessment and Plan.  Objective  Well appearing patient in no apparent distress; mood and affect are within normal limits.  A focused examination was performed including face, arms, legs. Relevant physical exam findings are noted in the Assessment and Plan.  R bicep Hyperkeratotic pap 1.5 x 1.0cm  R mid dorsum forearm Hyperkeratotic pap 1.1 x 0.9cm  R forearm x 1, Total  = 1 Erythematous keratotic or waxy stuck-on papule or plaque.   bil arms x 10 (10) Pink scaly macules   Left Lower Leg - Anterior Erythematous, scaly patches involving the ankle and distal lower leg with associated lower leg edema.   Left Lower Leg - Anterior, Right Lower Leg - Anterior L pretibial 1.0 x 1.0cm crust R pretibial 1.1 x 0.4cm crust   Assessment & Plan   History of Squamous Cell Carcinoma of the Skin - No evidence of recurrence today - No lymphadenopathy - Recommend regular full body skin exams - Recommend daily broad spectrum sunscreen SPF 30+ to sun-exposed areas, reapply every 2 hours as needed.  - Call if any new or changing lesions are noted between office visits - L forearm clear  Actinic Damage - chronic, secondary to cumulative UV radiation exposure/sun exposure over time - diffuse scaly erythematous macules with underlying dyspigmentation - Recommend daily broad spectrum sunscreen SPF 30+ to sun-exposed areas,  reapply every 2 hours as needed.  - Recommend staying in the shade or wearing long sleeves, sun glasses (UVA+UVB protection) and wide brim hats (4-inch brim around the entire circumference of the hat). - Call for new or changing lesions.  Neoplasm of skin (2) R bicep  Epidermal / dermal shaving  Lesion diameter (cm):  1.5 Informed consent: discussed and consent obtained   Timeout: patient name, date of birth, surgical site, and procedure verified   Procedure prep:  Patient was prepped and draped in usual sterile fashion Prep type:  Isopropyl alcohol Anesthesia: the lesion was anesthetized in a standard fashion   Anesthetic:  1% lidocaine w/ epinephrine 1-100,000 buffered w/ 8.4% NaHCO3 Instrument used: flexible razor blade   Hemostasis achieved with: pressure, aluminum chloride and electrodesiccation   Outcome: patient tolerated procedure well   Post-procedure details: sterile dressing applied and wound care instructions given   Dressing type: bandage and bacitracin    Destruction of lesion Complexity: extensive   Destruction method: electrodesiccation and curettage   Informed consent: discussed and consent obtained   Timeout:  patient name, date of birth, surgical site, and procedure verified Procedure prep:  Patient was prepped and draped in usual sterile fashion Prep type:  Isopropyl alcohol Anesthesia: the lesion was anesthetized in a standard fashion   Anesthetic:  1% lidocaine w/ epinephrine 1-100,000 buffered w/ 8.4% NaHCO3 Curettage performed in three different directions: Yes   Electrodesiccation performed over the curetted area: Yes   Lesion length (cm):  1.5 Lesion  width (cm):  1 Margin per side (cm):  0.2 Final wound size (cm):  1.9 Hemostasis achieved with:  pressure, aluminum chloride and electrodesiccation Outcome: patient tolerated procedure well with no complications   Post-procedure details: sterile dressing applied and wound care instructions given    Dressing type: bandage and bacitracin    Specimen 1 - Surgical pathology Differential Diagnosis: D48.5 R/O SCC  Check Margins: No Hyperkeratotic pap 1.5 x 1.0cm EDC today  R mid dorsum forearm  Epidermal / dermal shaving  Lesion diameter (cm):  1.1 Informed consent: discussed and consent obtained   Timeout: patient name, date of birth, surgical site, and procedure verified   Procedure prep:  Patient was prepped and draped in usual sterile fashion Prep type:  Isopropyl alcohol Anesthesia: the lesion was anesthetized in a standard fashion   Anesthetic:  1% lidocaine w/ epinephrine 1-100,000 buffered w/ 8.4% NaHCO3 Instrument used: flexible razor blade   Hemostasis achieved with: pressure, aluminum chloride and electrodesiccation   Outcome: patient tolerated procedure well   Post-procedure details: sterile dressing applied and wound care instructions given   Dressing type: bandage, petrolatum and bacitracin    Destruction of lesion Complexity: extensive   Destruction method: electrodesiccation and curettage   Informed consent: discussed and consent obtained   Timeout:  patient name, date of birth, surgical site, and procedure verified Procedure prep:  Patient was prepped and draped in usual sterile fashion Prep type:  Isopropyl alcohol Anesthesia: the lesion was anesthetized in a standard fashion   Anesthetic:  1% lidocaine w/ epinephrine 1-100,000 buffered w/ 8.4% NaHCO3 Curettage performed in three different directions: Yes   Electrodesiccation performed over the curetted area: Yes   Lesion length (cm):  1.1 Lesion width (cm):  0.9 Margin per side (cm):  0.2 Final wound size (cm):  1.5 Hemostasis achieved with:  pressure, aluminum chloride and electrodesiccation Outcome: patient tolerated procedure well with no complications   Post-procedure details: sterile dressing applied and wound care instructions given   Dressing type: bandage and bacitracin    Specimen 2 -  Surgical pathology Differential Diagnosis: D48.5 R/O SCC  Check Margins: No Hyperkeratotic pap 1.1 x 0.9cm EDC today  Inflamed seborrheic keratosis R forearm x 1, Total  = 1  Destruction of lesion - R forearm x 1, Total  = 1 Complexity: simple   Destruction method: cryotherapy   Informed consent: discussed and consent obtained   Timeout:  patient name, date of birth, surgical site, and procedure verified Lesion destroyed using liquid nitrogen: Yes   Region frozen until ice ball extended beyond lesion: Yes   Outcome: patient tolerated procedure well with no complications   Post-procedure details: wound care instructions given    AK (actinic keratosis) (10) bil arms x 10  Destruction of lesion - bil arms x 10 Complexity: simple   Destruction method: cryotherapy   Informed consent: discussed and consent obtained   Timeout:  patient name, date of birth, surgical site, and procedure verified Lesion destroyed using liquid nitrogen: Yes   Region frozen until ice ball extended beyond lesion: Yes   Outcome: patient tolerated procedure well with no complications   Post-procedure details: wound care instructions given    Stasis dermatitis of both legs Left Lower Leg - Anterior  Stasis in the legs causes chronic leg swelling, which may result in itchy or painful rashes, skin discoloration, skin texture changes, and sometimes ulceration.  Recommend daily compression hose/stockings- easiest to put on first thing in morning, remove at bedtime.  Elevate legs as much as possible. Avoid salt/sodium rich foods.   Recommend compression socks during waking hours.  Traumatic ulcer, limited to breakdown of skin (HCC) (2) Left Lower Leg - Anterior; Right Lower Leg - Anterior  Mechanical debridement today, followed by mupirocin and coban wrap.  Will send order to [facility] for compression socks  Return in about 3 months (around 09/21/2021) for AKs, ISKs, Stasis derm.  I, Othelia Pulling, RMA, am  acting as scribe for Sarina Ser, MD . Documentation: I have reviewed the above documentation for accuracy and completeness, and I agree with the above.  Sarina Ser, MD

## 2021-06-21 NOTE — Patient Instructions (Addendum)
Wound Care Instructions  Cleanse wound gently with soap and water once a day then pat dry with clean gauze. Apply a thing coat of Petrolatum (petroleum jelly, "Vaseline") over the wound (unless you have an allergy to this). We recommend that you use a new, sterile tube of Vaseline. Do not pick or remove scabs. Do not remove the yellow or white "healing tissue" from the base of the wound.  Cover the wound with fresh, clean, nonstick gauze and secure with paper tape. You may use Band-Aids in place of gauze and tape if the would is small enough, but would recommend trimming much of the tape off as there is often too much. Sometimes Band-Aids can irritate the skin.  You should call the office for your biopsy report after 1 week if you have not already been contacted.  If you experience any problems, such as abnormal amounts of bleeding, swelling, significant bruising, significant pain, or evidence of infection, please call the office immediately.  FOR ADULT SURGERY PATIENTS: If you need something for pain relief you may take 1 extra strength Tylenol (acetaminophen) AND 2 Ibuprofen (200mg each) together every 4 hours as needed for pain. (do not take these if you are allergic to them or if you have a reason you should not take them.) Typically, you may only need pain medication for 1 to 3 days.   If you have any questions or concerns for your doctor, please call our main line at [phone] and press option 4 to reach your doctor's medical assistant. If no one answers, please leave a voicemail as directed and we will return your call as soon as possible. Messages left after 4 pm will be answered the following business day.   You may also send us a message via MyChart. We typically respond to MyChart messages within 1-2 business days.  For prescription refills, please ask your pharmacy to contact our office. Our fax number is [phone].  If you have an urgent issue when the clinic is closed that  cannot wait until the next business day, you can page your doctor at the number below.    Please note that while we do our best to be available for urgent issues outside of office hours, we are not available 24/7.   If you have an urgent issue and are unable to reach us, you may choose to seek medical care at your doctor's office, retail clinic, urgent care center, or emergency room.  If you have a medical emergency, please immediately call 911 or go to the emergency department.  Pager Numbers  - Dr. Kowalski: [phone]  - Dr. Moye: [phone]  - Dr. Stewart: [phone]  In the event of inclement weather, please call our main line at [phone] for an update on the status of any delays or closures.  Dermatology Medication Tips: Please keep the boxes that topical medications come in in order to help keep track of the instructions about where and how to use these. Pharmacies typically print the medication instructions only on the boxes and not directly on the medication tubes.   If your medication is too expensive, please contact our office at [phone] option 4 or send us a message through MyChart.   We are unable to tell what your co-pay for medications will be in advance as this is different depending on your insurance coverage. However, we may be able to find a substitute medication at lower cost or fill out paperwork to get insurance to cover a needed   medication.   If a prior authorization is required to get your medication covered by your insurance company, please allow us 1-2 business days to complete this process.  Drug prices often vary depending on where the prescription is filled and some pharmacies may offer cheaper prices.  The website www.goodrx.com contains coupons for medications through different pharmacies. The prices here do not account for what the cost may be with help from insurance (it may be cheaper with your insurance), but the website can give you the  price if you did not use any insurance.  - You can print the associated coupon and take it with your prescription to the pharmacy.  - You may also stop by our office during regular business hours and pick up a GoodRx coupon card.  - If you need your prescription sent electronically to a different pharmacy, notify our office through [facility] MyChart or by phone at [phone] option 4.   

## 2021-06-22 DIAGNOSIS — L89890 Pressure ulcer of other site, unstageable: Secondary | ICD-10-CM | POA: Diagnosis not present

## 2021-06-25 ENCOUNTER — Encounter: Payer: Self-pay | Admitting: Dermatology

## 2021-06-27 ENCOUNTER — Telehealth: Payer: Self-pay

## 2021-06-27 NOTE — Telephone Encounter (Signed)
-----   Message from Ralene Bathe, MD sent at 06/27/2021 11:35 AM EDT ----- Diagnosis 1. Skin , right bicep MODERATELY DIFFERENTIATED SQUAMOUS CELL CARCINOMA 2. Skin , right mid dorsum forearm SQUAMOUS CELL CARCINOMA IN SITU, HYPERTROPHIC  1&2 - both cancer - SCC Both already treated Recheck next visit

## 2021-06-27 NOTE — Telephone Encounter (Signed)
Discussed biopsy results with pt  °

## 2021-07-03 DIAGNOSIS — E1142 Type 2 diabetes mellitus with diabetic polyneuropathy: Secondary | ICD-10-CM | POA: Diagnosis not present

## 2021-07-03 DIAGNOSIS — B351 Tinea unguium: Secondary | ICD-10-CM | POA: Diagnosis not present

## 2021-07-06 ENCOUNTER — Other Ambulatory Visit (INDEPENDENT_AMBULATORY_CARE_PROVIDER_SITE_OTHER): Payer: Self-pay | Admitting: Nurse Practitioner

## 2021-07-06 DIAGNOSIS — I739 Peripheral vascular disease, unspecified: Secondary | ICD-10-CM

## 2021-07-06 DIAGNOSIS — M79671 Pain in right foot: Secondary | ICD-10-CM

## 2021-07-07 ENCOUNTER — Other Ambulatory Visit: Payer: Self-pay

## 2021-07-07 ENCOUNTER — Encounter (INDEPENDENT_AMBULATORY_CARE_PROVIDER_SITE_OTHER): Payer: Self-pay | Admitting: Nurse Practitioner

## 2021-07-07 ENCOUNTER — Ambulatory Visit (INDEPENDENT_AMBULATORY_CARE_PROVIDER_SITE_OTHER): Payer: PPO

## 2021-07-07 ENCOUNTER — Encounter (INDEPENDENT_AMBULATORY_CARE_PROVIDER_SITE_OTHER): Payer: Self-pay

## 2021-07-07 ENCOUNTER — Ambulatory Visit (INDEPENDENT_AMBULATORY_CARE_PROVIDER_SITE_OTHER): Payer: PPO | Admitting: Nurse Practitioner

## 2021-07-07 DIAGNOSIS — M79672 Pain in left foot: Secondary | ICD-10-CM | POA: Diagnosis not present

## 2021-07-07 DIAGNOSIS — M79671 Pain in right foot: Secondary | ICD-10-CM | POA: Diagnosis not present

## 2021-07-07 DIAGNOSIS — I739 Peripheral vascular disease, unspecified: Secondary | ICD-10-CM | POA: Diagnosis not present

## 2021-07-14 ENCOUNTER — Telehealth (INDEPENDENT_AMBULATORY_CARE_PROVIDER_SITE_OTHER): Payer: Self-pay | Admitting: Nurse Practitioner

## 2021-07-14 NOTE — Telephone Encounter (Signed)
I Called to have patient r/s'd for [place] results ; husband would like to talk to Dr. Luana Shu to see if appt is necessary. Patient came in for [place] 07/07/21 where we had an emergency in clinic and patient left without seeing a provider. Husband called back shortly after speaking with me to see if it was possible for [facility] to call with results or send results to Dr. Luana Shu. I advised him that I would ask to see what providers says for that isn't typically our protocol. Patient states they are very old and would appreciate a call as it is hard to get around for appointments. Please advise.

## 2021-07-14 NOTE — Telephone Encounter (Signed)
Called and talked with the husband and discussed the results.  Based on this we will have the patient follow-up in 1 year with ABIs, if we can reach out to get him scheduled.

## 2021-07-14 NOTE — Telephone Encounter (Signed)
I contacted the patient and spoke with her husband in regards to her test results from her visit on 07/07/2021.  Unfortunately there was an emergency in the office and we were unable to see the patient at that time.  The patient was noted to have noncompressible ABIs but triphasic waveforms on the left and biphasic on the right.  The toe waveforms are slightly dampened.  The patient currently has no claudication-like symptoms, no wounds, no rest pain or other signs and symptoms of vascular compromise.  Based on this of the patient's advanced age and immobility we will continue to follow conservatively.  Patient is advised that if any of these things change they should contact her office for sooner follow-up visit.  Otherwise we will have the patient return at 1 year for noninvasive studies.

## 2021-07-14 NOTE — Telephone Encounter (Signed)
Thanks for that!  Will call and schedule patient

## 2021-07-31 DIAGNOSIS — E039 Hypothyroidism, unspecified: Secondary | ICD-10-CM | POA: Diagnosis not present

## 2021-07-31 DIAGNOSIS — I700 Atherosclerosis of aorta: Secondary | ICD-10-CM | POA: Diagnosis not present

## 2021-07-31 DIAGNOSIS — N183 Chronic kidney disease, stage 3 unspecified: Secondary | ICD-10-CM | POA: Diagnosis not present

## 2021-07-31 DIAGNOSIS — E1122 Type 2 diabetes mellitus with diabetic chronic kidney disease: Secondary | ICD-10-CM | POA: Diagnosis not present

## 2021-08-09 DIAGNOSIS — I700 Atherosclerosis of aorta: Secondary | ICD-10-CM | POA: Diagnosis not present

## 2021-08-09 DIAGNOSIS — T466X5A Adverse effect of antihyperlipidemic and antiarteriosclerotic drugs, initial encounter: Secondary | ICD-10-CM | POA: Diagnosis not present

## 2021-08-09 DIAGNOSIS — N183 Chronic kidney disease, stage 3 unspecified: Secondary | ICD-10-CM | POA: Diagnosis not present

## 2021-08-09 DIAGNOSIS — I10 Essential (primary) hypertension: Secondary | ICD-10-CM | POA: Diagnosis not present

## 2021-08-09 DIAGNOSIS — E039 Hypothyroidism, unspecified: Secondary | ICD-10-CM | POA: Diagnosis not present

## 2021-08-09 DIAGNOSIS — R1310 Dysphagia, unspecified: Secondary | ICD-10-CM | POA: Diagnosis not present

## 2021-08-09 DIAGNOSIS — E1122 Type 2 diabetes mellitus with diabetic chronic kidney disease: Secondary | ICD-10-CM | POA: Diagnosis not present

## 2021-08-09 DIAGNOSIS — M791 Myalgia, unspecified site: Secondary | ICD-10-CM | POA: Diagnosis not present

## 2021-09-21 ENCOUNTER — Other Ambulatory Visit: Payer: Self-pay

## 2021-09-21 ENCOUNTER — Ambulatory Visit: Payer: PPO | Admitting: Dermatology

## 2021-09-21 DIAGNOSIS — L820 Inflamed seborrheic keratosis: Secondary | ICD-10-CM

## 2021-09-21 DIAGNOSIS — I872 Venous insufficiency (chronic) (peripheral): Secondary | ICD-10-CM | POA: Diagnosis not present

## 2021-09-21 DIAGNOSIS — L570 Actinic keratosis: Secondary | ICD-10-CM | POA: Diagnosis not present

## 2021-09-21 MED ORDER — MOMETASONE FUROATE 0.1 % EX CREA: TOPICAL_CREAM | CUTANEOUS | 2 refills | Status: DC

## 2021-09-21 NOTE — Progress Notes (Signed)
   Follow-Up Visit   Subjective  Crystal Delacruz is a 87 y.o. female who presents for the following: Actinic Keratosis (3 months f/u Aks on the bilateral arms) and Follow-up (Check lower legs stasis dermatitis treating with compression stockings daily ).  She continues to have redness and scaliness and seems to be worsening.  The following portions of the chart were reviewed this encounter and updated as appropriate:   Tobacco  Allergies  Meds  Problems  Med Hx  Surg Hx  Fam Hx     Review of Systems:  No other skin or systemic complaints except as noted in HPI or Assessment and Plan.  Objective  Well appearing patient in no apparent distress; mood and affect are within normal limits.  A focused examination was performed including face,arms,legs . Relevant physical exam findings are noted in the Assessment and Plan.  right arm x 2 Erythematous keratotic or waxy stuck-on papule or plaque.   right arm x 7 (7) Erythematous thin papules/macules with gritty scale.   Left Lower Leg - Anterior Erythematous, scaly patches involving the ankle and distal lower leg with associated lower leg edema.    Assessment & Plan  Inflamed seborrheic keratosis right arm x 2  Destruction of lesion - right arm x 2 Complexity: simple   Destruction method: cryotherapy   Informed consent: discussed and consent obtained   Timeout:  patient name, date of birth, surgical site, and procedure verified Lesion destroyed using liquid nitrogen: Yes   Region frozen until ice ball extended beyond lesion: Yes   Outcome: patient tolerated procedure well with no complications   Post-procedure details: wound care instructions given    AK (actinic keratosis) right arm x 7  Destruction of lesion - right arm x 7 Complexity: simple   Destruction method: cryotherapy   Informed consent: discussed and consent obtained   Timeout:  patient name, date of birth, surgical site, and procedure verified Lesion destroyed  using liquid nitrogen: Yes   Region frozen until ice ball extended beyond lesion: Yes   Outcome: patient tolerated procedure well with no complications   Post-procedure details: wound care instructions given    Stasis dermatitis of both legs -flared with worsening Left Lower Leg - Anterior  Stasis dermatitis with dermatitis  Stasis in the legs causes chronic leg swelling, which may result in itchy or painful rashes, skin discoloration, skin texture changes, and sometimes ulceration.  Recommend daily compression hose/stockings- easiest to put on first thing in morning, remove at bedtime.  Elevate legs as much as possible. Avoid salt/sodium rich foods.   Start Mometasone cream apply to legs at bedtime, Mon-Fri  Related Medications mometasone (ELOCON) 0.1 % cream Apply to legs for rash at bedtime Mon-Fri  Return in about 6 months (around 03/21/2022) for Aks, stasis dermatitis .  IMarye Round, CMA, am acting as scribe for Sarina Ser, MD .  Documentation: I have reviewed the above documentation for accuracy and completeness, and I agree with the above.  Sarina Ser, MD

## 2021-09-21 NOTE — Patient Instructions (Addendum)

## 2021-09-25 NOTE — Progress Notes (Signed)
Patient seen today

## 2021-10-06 ENCOUNTER — Encounter: Payer: Self-pay | Admitting: Dermatology

## 2021-10-12 DIAGNOSIS — E119 Type 2 diabetes mellitus without complications: Secondary | ICD-10-CM | POA: Diagnosis not present

## 2021-12-02 ENCOUNTER — Other Ambulatory Visit: Payer: Self-pay

## 2021-12-02 ENCOUNTER — Inpatient Hospital Stay
Admission: EM | Admit: 2021-12-02 | Discharge: 2021-12-07 | DRG: 871 | Disposition: A | Discharge status: Home Health | Payer: PPO | Attending: Hospitalist | Admitting: Hospitalist

## 2021-12-02 ENCOUNTER — Emergency Department: Payer: PPO

## 2021-12-02 DIAGNOSIS — D696 Thrombocytopenia, unspecified: Secondary | ICD-10-CM | POA: Diagnosis not present

## 2021-12-02 DIAGNOSIS — Z885 Allergy status to narcotic agent status: Secondary | ICD-10-CM

## 2021-12-02 DIAGNOSIS — Z20822 Contact with and (suspected) exposure to covid-19: Secondary | ICD-10-CM | POA: Diagnosis present

## 2021-12-02 DIAGNOSIS — N179 Acute kidney failure, unspecified: Secondary | ICD-10-CM | POA: Diagnosis not present

## 2021-12-02 DIAGNOSIS — J188 Other pneumonia, unspecified organism: Secondary | ICD-10-CM | POA: Diagnosis present

## 2021-12-02 DIAGNOSIS — B961 Klebsiella pneumoniae [K. pneumoniae] as the cause of diseases classified elsewhere: Secondary | ICD-10-CM | POA: Diagnosis not present

## 2021-12-02 DIAGNOSIS — G9341 Metabolic encephalopathy: Secondary | ICD-10-CM | POA: Diagnosis not present

## 2021-12-02 DIAGNOSIS — E1122 Type 2 diabetes mellitus with diabetic chronic kidney disease: Secondary | ICD-10-CM | POA: Diagnosis not present

## 2021-12-02 DIAGNOSIS — A419 Sepsis, unspecified organism: Secondary | ICD-10-CM

## 2021-12-02 DIAGNOSIS — Z6827 Body mass index (BMI) 27.0-27.9, adult: Secondary | ICD-10-CM | POA: Diagnosis not present

## 2021-12-02 DIAGNOSIS — E039 Hypothyroidism, unspecified: Secondary | ICD-10-CM | POA: Diagnosis present

## 2021-12-02 DIAGNOSIS — R599 Enlarged lymph nodes, unspecified: Secondary | ICD-10-CM | POA: Diagnosis not present

## 2021-12-02 DIAGNOSIS — Z79899 Other long term (current) drug therapy: Secondary | ICD-10-CM

## 2021-12-02 DIAGNOSIS — B9689 Other specified bacterial agents as the cause of diseases classified elsewhere: Secondary | ICD-10-CM | POA: Diagnosis not present

## 2021-12-02 DIAGNOSIS — I129 Hypertensive chronic kidney disease with stage 1 through stage 4 chronic kidney disease, or unspecified chronic kidney disease: Secondary | ICD-10-CM | POA: Diagnosis present

## 2021-12-02 DIAGNOSIS — M1990 Unspecified osteoarthritis, unspecified site: Secondary | ICD-10-CM | POA: Diagnosis not present

## 2021-12-02 DIAGNOSIS — A4159 Other Gram-negative sepsis: Secondary | ICD-10-CM | POA: Diagnosis not present

## 2021-12-02 DIAGNOSIS — J9601 Acute respiratory failure with hypoxia: Secondary | ICD-10-CM | POA: Diagnosis present

## 2021-12-02 DIAGNOSIS — N390 Urinary tract infection, site not specified: Secondary | ICD-10-CM | POA: Diagnosis not present

## 2021-12-02 DIAGNOSIS — E785 Hyperlipidemia, unspecified: Secondary | ICD-10-CM | POA: Diagnosis present

## 2021-12-02 DIAGNOSIS — Z79890 Hormone replacement therapy (postmenopausal): Secondary | ICD-10-CM

## 2021-12-02 DIAGNOSIS — N1831 Chronic kidney disease, stage 3a: Secondary | ICD-10-CM | POA: Diagnosis not present

## 2021-12-02 DIAGNOSIS — Z7982 Long term (current) use of aspirin: Secondary | ICD-10-CM

## 2021-12-02 DIAGNOSIS — Z91048 Other nonmedicinal substance allergy status: Secondary | ICD-10-CM

## 2021-12-02 DIAGNOSIS — E8721 Acute metabolic acidosis: Secondary | ICD-10-CM | POA: Diagnosis present

## 2021-12-02 DIAGNOSIS — E1142 Type 2 diabetes mellitus with diabetic polyneuropathy: Secondary | ICD-10-CM | POA: Diagnosis present

## 2021-12-02 DIAGNOSIS — J189 Pneumonia, unspecified organism: Secondary | ICD-10-CM | POA: Diagnosis present

## 2021-12-02 DIAGNOSIS — R410 Disorientation, unspecified: Secondary | ICD-10-CM | POA: Diagnosis not present

## 2021-12-02 DIAGNOSIS — A409 Streptococcal sepsis, unspecified: Secondary | ICD-10-CM | POA: Diagnosis not present

## 2021-12-02 DIAGNOSIS — F0392 Unspecified dementia, unspecified severity, with psychotic disturbance: Secondary | ICD-10-CM | POA: Diagnosis not present

## 2021-12-02 DIAGNOSIS — Z794 Long term (current) use of insulin: Secondary | ICD-10-CM

## 2021-12-02 DIAGNOSIS — E872 Acidosis, unspecified: Secondary | ICD-10-CM | POA: Diagnosis not present

## 2021-12-02 DIAGNOSIS — E663 Overweight: Secondary | ICD-10-CM | POA: Diagnosis not present

## 2021-12-02 DIAGNOSIS — F0390 Unspecified dementia without behavioral disturbance: Secondary | ICD-10-CM

## 2021-12-02 DIAGNOSIS — E11649 Type 2 diabetes mellitus with hypoglycemia without coma: Secondary | ICD-10-CM | POA: Diagnosis not present

## 2021-12-02 DIAGNOSIS — R0602 Shortness of breath: Secondary | ICD-10-CM | POA: Diagnosis not present

## 2021-12-02 DIAGNOSIS — J45901 Unspecified asthma with (acute) exacerbation: Secondary | ICD-10-CM | POA: Diagnosis not present

## 2021-12-02 DIAGNOSIS — E876 Hypokalemia: Secondary | ICD-10-CM | POA: Diagnosis not present

## 2021-12-02 DIAGNOSIS — R9431 Abnormal electrocardiogram [ECG] [EKG]: Secondary | ICD-10-CM

## 2021-12-02 DIAGNOSIS — Z91040 Latex allergy status: Secondary | ICD-10-CM

## 2021-12-02 DIAGNOSIS — Z85828 Personal history of other malignant neoplasm of skin: Secondary | ICD-10-CM

## 2021-12-02 DIAGNOSIS — J13 Pneumonia due to Streptococcus pneumoniae: Secondary | ICD-10-CM | POA: Diagnosis not present

## 2021-12-02 DIAGNOSIS — R918 Other nonspecific abnormal finding of lung field: Secondary | ICD-10-CM | POA: Diagnosis not present

## 2021-12-02 DIAGNOSIS — J90 Pleural effusion, not elsewhere classified: Secondary | ICD-10-CM | POA: Diagnosis not present

## 2021-12-02 DIAGNOSIS — R531 Weakness: Secondary | ICD-10-CM | POA: Diagnosis not present

## 2021-12-02 DIAGNOSIS — R6520 Severe sepsis without septic shock: Secondary | ICD-10-CM | POA: Diagnosis not present

## 2021-12-02 DIAGNOSIS — Z7401 Bed confinement status: Secondary | ICD-10-CM | POA: Diagnosis not present

## 2021-12-02 DIAGNOSIS — I10 Essential (primary) hypertension: Secondary | ICD-10-CM | POA: Diagnosis not present

## 2021-12-02 LAB — CBC WITH DIFFERENTIAL/PLATELET
Abs Immature Granulocytes: 0.05 10*3/uL (ref 0.00–0.07)
Basophils Absolute: 0 10*3/uL (ref 0.0–0.1)
Basophils Relative: 0 %
Eosinophils Absolute: 0.2 10*3/uL (ref 0.0–0.5)
Eosinophils Relative: 1 %
HCT: 42 % (ref 36.0–46.0)
Hemoglobin: 12.8 g/dL (ref 12.0–15.0)
Immature Granulocytes: 0 %
Lymphocytes Relative: 15 %
Lymphs Abs: 2 10*3/uL (ref 0.7–4.0)
MCH: 30.3 pg (ref 26.0–34.0)
MCHC: 30.5 g/dL (ref 30.0–36.0)
MCV: 99.5 fL (ref 80.0–100.0)
Monocytes Absolute: 0.5 10*3/uL (ref 0.1–1.0)
Monocytes Relative: 4 %
Neutro Abs: 10.3 10*3/uL — ABNORMAL HIGH (ref 1.7–7.7)
Neutrophils Relative %: 80 %
Platelets: 214 10*3/uL (ref 150–400)
RBC: 4.22 MIL/uL (ref 3.87–5.11)
RDW: 14.3 % (ref 11.5–15.5)
WBC: 13 10*3/uL — ABNORMAL HIGH (ref 4.0–10.5)
nRBC: 0 % (ref 0.0–0.2)

## 2021-12-02 LAB — RESP PANEL BY RT-PCR (FLU A&B, COVID) ARPGX2
Influenza A by PCR: NEGATIVE
Influenza B by PCR: NEGATIVE
SARS Coronavirus 2 by RT PCR: NEGATIVE

## 2021-12-02 LAB — BASIC METABOLIC PANEL
Anion gap: 11 (ref 5–15)
BUN: 25 mg/dL — ABNORMAL HIGH (ref 8–23)
CO2: 31 mmol/L (ref 22–32)
Calcium: 8.7 mg/dL — ABNORMAL LOW (ref 8.9–10.3)
Chloride: 95 mmol/L — ABNORMAL LOW (ref 98–111)
Creatinine, Ser: 1.38 mg/dL — ABNORMAL HIGH (ref 0.44–1.00)
GFR, Estimated: 35 mL/min — ABNORMAL LOW (ref >60)
Glucose, Bld: 269 mg/dL — ABNORMAL HIGH (ref 70–99)
Potassium: 4.4 mmol/L (ref 3.5–5.1)
Sodium: 137 mmol/L (ref 135–145)

## 2021-12-02 LAB — LACTIC ACID, PLASMA: Lactic Acid, Venous: 3.6 mmol/L (ref 0.5–1.9)

## 2021-12-02 LAB — TROPONIN I (HIGH SENSITIVITY): Troponin I (High Sensitivity): 8 ng/L (ref <18)

## 2021-12-02 MED ORDER — LACTATED RINGERS IV BOLUS (SEPSIS)
1000.0000 mL | Freq: Once | INTRAVENOUS | Status: AC
  Administered 2021-12-03: 1000 mL via INTRAVENOUS

## 2021-12-02 MED ORDER — LACTATED RINGERS IV SOLN: INTRAVENOUS | Status: AC

## 2021-12-02 MED ORDER — SODIUM CHLORIDE 0.9 % IV SOLN
2.0000 g | INTRAVENOUS | Status: AC
  Administered 2021-12-02 – 2021-12-06 (×5): 2 g via INTRAVENOUS
  Filled 2021-12-02: qty 20
  Filled 2021-12-02: qty 2
  Filled 2021-12-02 (×3): qty 20

## 2021-12-02 MED ORDER — LACTATED RINGERS IV BOLUS (SEPSIS): 500.0000 mL | Freq: Once | INTRAVENOUS | Status: DC

## 2021-12-02 MED ORDER — LACTATED RINGERS IV BOLUS (SEPSIS)
1000.0000 mL | Freq: Once | INTRAVENOUS | Status: AC
  Administered 2021-12-02: 1000 mL via INTRAVENOUS

## 2021-12-02 MED ORDER — SODIUM CHLORIDE 0.9 % IV SOLN
500.0000 mg | INTRAVENOUS | Status: DC
  Administered 2021-12-03: 500 mg via INTRAVENOUS
  Filled 2021-12-02 (×2): qty 5

## 2021-12-02 NOTE — ED Triage Notes (Signed)
Pt husband states that she may have a UTI. Pt Has Hx of Dementia and is more confused than normal per husband. Pt has not been eating and drinking much lately. Pt appears short of breath.

## 2021-12-02 NOTE — ED Provider Notes (Signed)
[facility] Provider Note    Event Date/Time   First MD Initiated Contact with Patient 12/02/21 2331     (approximate)   History   Weakness and Altered Mental Status   HPI  Crystal Delacruz is a 88 y.o. female whose medical history most notably includes a history of dementia, diabetes, and asthma as well as allegedly some chronic kidney disease although her creatinine is usually around 0.8.  She presents tonight for several days of worsening overall status including increased fatigue and weakness, decreased oral intake with concern for dehydration, increased confusion in the setting of chronic dementia, and increased shortness of breath particularly with exertion.  Her adult son and her husband are at bedside and they both commented that she is working a lot harder than usual to breathe.  Her asthma makes it a little bit hard for her to breathe at baseline at times but not as bad as this, and she does not use supplemental oxygen usually.  She does not smoke.  She was noted to have an oxygen saturation of 88% on room air at the time of triage and was placed on 2 L of oxygen by nasal cannula.  The patient currently states that she feels fine but is confused, at her mental baseline, and states that she does not know why she is here.     Physical Exam   Triage Vital Signs: ED Triage Vitals  Enc Vitals Group     BP 12/02/21 2141 109/60     Pulse Rate 12/02/21 2141 71     Resp 12/02/21 2141 (!) 26     Temp 12/02/21 2141 98.8 F (37.1 C)     Temp Source 12/02/21 2141 Oral     SpO2 12/02/21 2140 92 %     Weight --      Height --      Head Circumference --      Peak Flow --      Pain Score 12/02/21 2141 0     Pain Loc --      Pain Edu? --      Excl. in [place]? --     Most recent vital signs: Vitals:   12/02/21 2336 12/02/21 2341  BP:  (!) 115/57  Pulse:  60  Resp:  17  Temp: 99.2 F (37.3 C)   SpO2: 94% 99%     General: Awake, no distress currently  on 2 L of oxygen. CV:  Good peripheral perfusion.  Normal heart sounds, no tachycardia. Resp:  Normal effort.  No wheezing currently.  No coarse breath sounds. Abd:  No distention.  No tenderness to palpation of the abdomen.   ED Results / Procedures / Treatments   Labs (all labs ordered are listed, but only abnormal results are displayed) Labs Reviewed  BASIC METABOLIC PANEL - Abnormal; Notable for the following components:      Result Value   Chloride 95 (*)    Glucose, Bld 269 (*)    BUN 25 (*)    Creatinine, Ser 1.38 (*)    Calcium 8.7 (*)    GFR, Estimated 35 (*)    All other components within normal limits  CBC WITH DIFFERENTIAL/PLATELET - Abnormal; Notable for the following components:   WBC 13.0 (*)    Neutro Abs 10.3 (*)    All other components within normal limits  LACTIC ACID, PLASMA - Abnormal; Notable for the following components:   Lactic Acid, Venous 3.6 (*)  All other components within normal limits  URINALYSIS, COMPLETE (UACMP) WITH MICROSCOPIC - Abnormal; Notable for the following components:   Color, Urine YELLOW (*)    APPearance CLOUDY (*)    pH 8.5 (*)    Hgb urine dipstick TRACE (*)    Protein, ur TRACE (*)    Leukocytes,Ua LARGE (*)    Bacteria, UA MANY (*)    All other components within normal limits  RESP PANEL BY RT-PCR (FLU A&B, COVID) ARPGX2  CULTURE, BLOOD (ROUTINE X 2)  CULTURE, BLOOD (ROUTINE X 2)  URINE CULTURE  EXPECTORATED SPUTUM ASSESSMENT W GRAM STAIN, RFLX TO RESP C  LACTIC ACID, PLASMA  LEGIONELLA PNEUMOPHILA SEROGP 1 UR AG  STREP PNEUMONIAE URINARY ANTIGEN  BASIC METABOLIC PANEL  CBC  TROPONIN I (HIGH SENSITIVITY)  TROPONIN I (HIGH SENSITIVITY)     EKG  ED ECG REPORT I, Hinda Kehr, the attending physician, personally viewed and interpreted this ECG.  Date: 12/02/2021 EKG Time: 23: 33 Rate: 65 Rhythm: sinus rhythm QRS Axis: normal Intervals: Abnormal QTc interval at 623 ms, otherwise unremarkable ST/T Wave  abnormalities: Non-specific ST segment / T-wave changes, but no clear evidence of acute ischemia. Narrative Interpretation: no definitive evidence of acute ischemia; does not meet STEMI criteria.    RADIOLOGY I personally reviewed the patient's chest x-ray and it appears consistent with multifocal pneumonia.  The radiology report also agrees with this assessment.    PROCEDURES:  Critical Care performed: Yes, see critical care procedure note(s)  .1-3 Lead EKG Interpretation Performed by: Hinda Kehr, MD Authorized by: Hinda Kehr, MD     Interpretation: normal     ECG rate:  67   ECG rate assessment: normal     Rhythm: sinus rhythm     Ectopy: none     Conduction: normal   .Critical Care Performed by: Hinda Kehr, MD Authorized by: Hinda Kehr, MD   Critical care provider statement:    Critical care time (minutes):  30   Critical care time was exclusive of:  Separately billable procedures and treating other patients   Critical care was necessary to treat or prevent imminent or life-threatening deterioration of the following conditions:  Sepsis   Critical care was time spent personally by me on the following activities:  Development of treatment plan with patient or surrogate, evaluation of patient's response to treatment, examination of patient, obtaining history from patient or surrogate, ordering and performing treatments and interventions, ordering and review of laboratory studies, ordering and review of radiographic studies, pulse oximetry, re-evaluation of patient's condition and review of old charts   MEDICATIONS ORDERED IN ED: Medications  lactated ringers infusion (has no administration in time range)  lactated ringers bolus 1,000 mL (0 mLs Intravenous Stopped 12/03/21 0143)    And  lactated ringers bolus 1,000 mL (1,000 mLs Intravenous New Bag/Given 12/03/21 0152)    And  lactated ringers bolus 500 mL (has no administration in time range)  cefTRIAXone  (ROCEPHIN) 2 g in sodium chloride 0.9 % 100 mL IVPB (0 g Intravenous Stopped 12/03/21 0021)  azithromycin (ZITHROMAX) 500 mg in sodium chloride 0.9 % 250 mL IVPB (500 mg Intravenous New Bag/Given 12/03/21 0020)  aspirin EC tablet 81 mg (has no administration in time range)  metoprolol succinate (TOPROL-XL) 24 hr tablet 50 mg (has no administration in time range)  torsemide (DEMADEX) tablet 40 mg (has no administration in time range)  glimepiride (AMARYL) tablet 4 mg (has no administration in time range)  insulin glargine-yfgn (SEMGLEE)  injection 15 Units (has no administration in time range)  levothyroxine (SYNTHROID) tablet 50 mcg (has no administration in time range)  gabapentin (NEURONTIN) capsule 200 mg (has no administration in time range)  enoxaparin (LOVENOX) injection 30 mg (has no administration in time range)  0.9 %  sodium chloride infusion (has no administration in time range)  acetaminophen (TYLENOL) tablet 650 mg (has no administration in time range)    Or  acetaminophen (TYLENOL) suppository 650 mg (has no administration in time range)  traZODone (DESYREL) tablet 25 mg (has no administration in time range)  magnesium hydroxide (MILK OF MAGNESIA) suspension 30 mL (has no administration in time range)  ondansetron (ZOFRAN) tablet 4 mg (has no administration in time range)    Or  ondansetron (ZOFRAN) injection 4 mg (has no administration in time range)  metoCLOPramide (REGLAN) injection 5 mg (has no administration in time range)  guaiFENesin (MUCINEX) 12 hr tablet 600 mg (has no administration in time range)  ipratropium-albuterol (DUONEB) 0.5-2.5 (3) MG/3ML nebulizer solution 3 mL (has no administration in time range)     IMPRESSION / MDM / ASSESSMENT AND PLAN / ED COURSE  I reviewed the triage vital signs and the nursing notes.                              Differential diagnosis includes, but is not limited to, UTI, pneumonia, intra-abdominal infection, electrolyte or  metabolic abnormality, acute kidney injury.  The patient is on the cardiac monitor to evaluate for evidence of arrhythmia and/or significant heart rate changes.  Patient's vital signs are stable and within normal limits except for her initial hypoxemia requiring 2 L of oxygen by nasal cannula.  However this is an acute change from baseline.  Labs ordered in triage include respiratory viral panel, high-sensitivity troponin, basic metabolic panel, CBC, lactic acid.  I reviewed her results and her lactic acid was substantially elevated at 3.6.  Her CBC was notable for leukocytosis of 13.  Her basic metabolic panel was notable for acute kidney injury with creatinine of 1.38 in the setting of what is usually a normal creatinine of 0.8.  Urinalysis shows many bacteria and WBCs consistent with urinary tract infection.  I personally reviewed her chest x-ray and she has what appears to be multifocal pneumonia.  Given her constellation of symptoms and results, she meets sepsis criteria and I initiated the sepsis protocol.  I ordered 2.5 L LR based on 30 mL/kg IV fluid bolus and her acute kidney injury.  I also ordered ceftriaxone 2 g IV and azithromycin 500 mg IV as per sepsis protocol for community-acquired pneumonia.  Cultures are pending.  Respiratory viral panel is negative.  I discussed the results with the patient and with her family and they agree with the plan for admission.  She is at high risk for decompensation including acute respiratory failure and cardiopulmonary collapse.  She needs inpatient treatment.   Clinical Course as of 12/03/21 6767  Sun Dec 03, 2021  0041 (delayed documentation) Consulted Dr. Sidney Ace with the hospitalist service for admission.  Discussed case, he agrees with the plan and will admit. [CF]    Clinical Course User Index [CF] Hinda Kehr, MD     FINAL CLINICAL IMPRESSION(S) / ED DIAGNOSES   Final diagnoses:  Sepsis, due to unspecified organism, unspecified  whether acute organ dysfunction present [facility])  Acute kidney injury ([place])  Multifocal pneumonia  Acute respiratory failure with hypoxemia (  [place])  Delirium  Chronic dementia ([place])  Prolonged QT interval  Urinary tract infection without hematuria, site unspecified     Rx / DC Orders   ED Discharge Orders     None        Note:  This document was prepared using Dragon voice recognition software and may include unintentional dictation errors.   Hinda Kehr, MD 12/03/21 [phone]

## 2021-12-02 NOTE — Progress Notes (Signed)
CODE SEPSIS - PHARMACY COMMUNICATION  **Broad Spectrum Antibiotics should be administered within 1 hour of Sepsis diagnosis**  Time Code Sepsis Called/Page Received: 2350  Antibiotics Ordered: Azithromycin & Ceftriaxone  Time of 1st antibiotic administration: Marina, PharmD, [facility] 12/02/2021 11:50 PM

## 2021-12-03 ENCOUNTER — Encounter: Payer: Self-pay | Admitting: Family Medicine

## 2021-12-03 ENCOUNTER — Other Ambulatory Visit: Payer: Self-pay

## 2021-12-03 DIAGNOSIS — A403 Sepsis due to Streptococcus pneumoniae: Secondary | ICD-10-CM

## 2021-12-03 DIAGNOSIS — J45901 Unspecified asthma with (acute) exacerbation: Secondary | ICD-10-CM | POA: Diagnosis present

## 2021-12-03 DIAGNOSIS — J90 Pleural effusion, not elsewhere classified: Secondary | ICD-10-CM | POA: Diagnosis not present

## 2021-12-03 DIAGNOSIS — I10 Essential (primary) hypertension: Secondary | ICD-10-CM | POA: Diagnosis not present

## 2021-12-03 DIAGNOSIS — A4159 Other Gram-negative sepsis: Secondary | ICD-10-CM | POA: Diagnosis present

## 2021-12-03 DIAGNOSIS — R6520 Severe sepsis without septic shock: Secondary | ICD-10-CM | POA: Diagnosis not present

## 2021-12-03 DIAGNOSIS — F0392 Unspecified dementia, unspecified severity, with psychotic disturbance: Secondary | ICD-10-CM | POA: Diagnosis present

## 2021-12-03 DIAGNOSIS — J9601 Acute respiratory failure with hypoxia: Secondary | ICD-10-CM | POA: Diagnosis not present

## 2021-12-03 DIAGNOSIS — N179 Acute kidney failure, unspecified: Secondary | ICD-10-CM

## 2021-12-03 DIAGNOSIS — Z7401 Bed confinement status: Secondary | ICD-10-CM | POA: Diagnosis not present

## 2021-12-03 DIAGNOSIS — E785 Hyperlipidemia, unspecified: Secondary | ICD-10-CM | POA: Diagnosis present

## 2021-12-03 DIAGNOSIS — R410 Disorientation, unspecified: Secondary | ICD-10-CM | POA: Diagnosis not present

## 2021-12-03 DIAGNOSIS — R0602 Shortness of breath: Secondary | ICD-10-CM | POA: Diagnosis not present

## 2021-12-03 DIAGNOSIS — J189 Pneumonia, unspecified organism: Secondary | ICD-10-CM | POA: Diagnosis not present

## 2021-12-03 DIAGNOSIS — E1142 Type 2 diabetes mellitus with diabetic polyneuropathy: Secondary | ICD-10-CM

## 2021-12-03 DIAGNOSIS — M1990 Unspecified osteoarthritis, unspecified site: Secondary | ICD-10-CM | POA: Diagnosis present

## 2021-12-03 DIAGNOSIS — E1122 Type 2 diabetes mellitus with diabetic chronic kidney disease: Secondary | ICD-10-CM | POA: Diagnosis present

## 2021-12-03 DIAGNOSIS — R531 Weakness: Secondary | ICD-10-CM | POA: Diagnosis not present

## 2021-12-03 DIAGNOSIS — E039 Hypothyroidism, unspecified: Secondary | ICD-10-CM | POA: Diagnosis present

## 2021-12-03 DIAGNOSIS — R918 Other nonspecific abnormal finding of lung field: Secondary | ICD-10-CM | POA: Diagnosis not present

## 2021-12-03 DIAGNOSIS — A419 Sepsis, unspecified organism: Secondary | ICD-10-CM

## 2021-12-03 DIAGNOSIS — R599 Enlarged lymph nodes, unspecified: Secondary | ICD-10-CM | POA: Diagnosis not present

## 2021-12-03 DIAGNOSIS — E876 Hypokalemia: Secondary | ICD-10-CM | POA: Diagnosis present

## 2021-12-03 DIAGNOSIS — G9341 Metabolic encephalopathy: Secondary | ICD-10-CM | POA: Diagnosis present

## 2021-12-03 DIAGNOSIS — Z6827 Body mass index (BMI) 27.0-27.9, adult: Secondary | ICD-10-CM | POA: Diagnosis not present

## 2021-12-03 DIAGNOSIS — B9689 Other specified bacterial agents as the cause of diseases classified elsewhere: Secondary | ICD-10-CM | POA: Diagnosis not present

## 2021-12-03 DIAGNOSIS — Z20822 Contact with and (suspected) exposure to covid-19: Secondary | ICD-10-CM | POA: Diagnosis present

## 2021-12-03 DIAGNOSIS — N390 Urinary tract infection, site not specified: Secondary | ICD-10-CM | POA: Diagnosis not present

## 2021-12-03 DIAGNOSIS — E11649 Type 2 diabetes mellitus with hypoglycemia without coma: Secondary | ICD-10-CM | POA: Diagnosis not present

## 2021-12-03 DIAGNOSIS — E872 Acidosis, unspecified: Secondary | ICD-10-CM | POA: Diagnosis present

## 2021-12-03 DIAGNOSIS — D696 Thrombocytopenia, unspecified: Secondary | ICD-10-CM

## 2021-12-03 DIAGNOSIS — Z7982 Long term (current) use of aspirin: Secondary | ICD-10-CM | POA: Diagnosis not present

## 2021-12-03 DIAGNOSIS — E663 Overweight: Secondary | ICD-10-CM | POA: Diagnosis present

## 2021-12-03 DIAGNOSIS — I129 Hypertensive chronic kidney disease with stage 1 through stage 4 chronic kidney disease, or unspecified chronic kidney disease: Secondary | ICD-10-CM | POA: Diagnosis present

## 2021-12-03 DIAGNOSIS — N189 Chronic kidney disease, unspecified: Secondary | ICD-10-CM

## 2021-12-03 DIAGNOSIS — N1831 Chronic kidney disease, stage 3a: Secondary | ICD-10-CM | POA: Diagnosis present

## 2021-12-03 DIAGNOSIS — A409 Streptococcal sepsis, unspecified: Secondary | ICD-10-CM | POA: Diagnosis not present

## 2021-12-03 DIAGNOSIS — B961 Klebsiella pneumoniae [K. pneumoniae] as the cause of diseases classified elsewhere: Secondary | ICD-10-CM | POA: Diagnosis present

## 2021-12-03 DIAGNOSIS — J13 Pneumonia due to Streptococcus pneumoniae: Secondary | ICD-10-CM | POA: Diagnosis present

## 2021-12-03 LAB — CBC
HCT: 38.8 % (ref 36.0–46.0)
Hemoglobin: 12 g/dL (ref 12.0–15.0)
MCH: 30.5 pg (ref 26.0–34.0)
MCHC: 30.9 g/dL (ref 30.0–36.0)
MCV: 98.7 fL (ref 80.0–100.0)
Platelets: 145 10*3/uL — ABNORMAL LOW (ref 150–400)
RBC: 3.93 MIL/uL (ref 3.87–5.11)
RDW: 14.2 % (ref 11.5–15.5)
WBC: 12.6 10*3/uL — ABNORMAL HIGH (ref 4.0–10.5)
nRBC: 0 % (ref 0.0–0.2)

## 2021-12-03 LAB — URINALYSIS, COMPLETE (UACMP) WITH MICROSCOPIC
Bilirubin Urine: NEGATIVE
Glucose, UA: NEGATIVE mg/dL
Ketones, ur: NEGATIVE mg/dL
Nitrite: NEGATIVE
Specific Gravity, Urine: 1.015 (ref 1.005–1.030)
Squamous Epithelial / HPF: NONE SEEN (ref 0–5)
pH: 8.5 — ABNORMAL HIGH (ref 5.0–8.0)

## 2021-12-03 LAB — BASIC METABOLIC PANEL
Anion gap: 8 (ref 5–15)
BUN: 22 mg/dL (ref 8–23)
CO2: 32 mmol/L (ref 22–32)
Calcium: 8.2 mg/dL — ABNORMAL LOW (ref 8.9–10.3)
Chloride: 102 mmol/L (ref 98–111)
Creatinine, Ser: 1.04 mg/dL — ABNORMAL HIGH (ref 0.44–1.00)
GFR, Estimated: 49 mL/min — ABNORMAL LOW (ref >60)
Glucose, Bld: 65 mg/dL — ABNORMAL LOW (ref 70–99)
Potassium: 3.9 mmol/L (ref 3.5–5.1)
Sodium: 142 mmol/L (ref 135–145)

## 2021-12-03 LAB — GLUCOSE, CAPILLARY
Glucose-Capillary: 62 mg/dL — ABNORMAL LOW (ref 70–99)
Glucose-Capillary: 62 mg/dL — ABNORMAL LOW (ref 70–99)
Glucose-Capillary: 112 mg/dL — ABNORMAL HIGH (ref 70–99)
Glucose-Capillary: 109 mg/dL — ABNORMAL HIGH (ref 70–99)
Glucose-Capillary: 144 mg/dL — ABNORMAL HIGH (ref 70–99)
Glucose-Capillary: 218 mg/dL — ABNORMAL HIGH (ref 70–99)

## 2021-12-03 LAB — TROPONIN I (HIGH SENSITIVITY): Troponin I (High Sensitivity): 5 ng/L (ref <18)

## 2021-12-03 LAB — LACTIC ACID, PLASMA: Lactic Acid, Venous: 1.7 mmol/L (ref 0.5–1.9)

## 2021-12-03 LAB — STREP PNEUMONIAE URINARY ANTIGEN: Strep Pneumo Urinary Antigen: POSITIVE — AB

## 2021-12-03 MED ORDER — MAGNESIUM HYDROXIDE 400 MG/5ML PO SUSP
30.0000 mL | Freq: Every day | ORAL | Status: DC | PRN
  Administered 2021-12-06: 08:00:00 30 mL via ORAL
  Filled 2021-12-03 (×2): qty 30

## 2021-12-03 MED ORDER — PHENAZOPYRIDINE HCL 100 MG PO TABS
100.0000 mg | ORAL_TABLET | Freq: Three times a day (TID) | ORAL | Status: DC
  Administered 2021-12-03 – 2021-12-04 (×4): 100 mg via ORAL
  Filled 2021-12-03 (×7): qty 1

## 2021-12-03 MED ORDER — TORSEMIDE 20 MG PO TABS
40.0000 mg | ORAL_TABLET | Freq: Every day | ORAL | Status: DC
  Administered 2021-12-03 – 2021-12-06 (×4): 40 mg via ORAL
  Filled 2021-12-03 (×4): qty 2

## 2021-12-03 MED ORDER — SALINE SPRAY 0.65 % NA SOLN
1.0000 | NASAL | Status: DC | PRN
  Filled 2021-12-03: qty 44

## 2021-12-03 MED ORDER — MENTHOL 3 MG MT LOZG
1.0000 | LOZENGE | OROMUCOSAL | Status: DC | PRN
  Administered 2021-12-06: 21:00:00 3 mg via ORAL
  Filled 2021-12-03: qty 9

## 2021-12-03 MED ORDER — GLIMEPIRIDE 4 MG PO TABS
4.0000 mg | ORAL_TABLET | Freq: Every day | ORAL | Status: DC
  Filled 2021-12-03: qty 1

## 2021-12-03 MED ORDER — ASPIRIN EC 81 MG PO TBEC
81.0000 mg | DELAYED_RELEASE_TABLET | Freq: Every day | ORAL | Status: DC
  Administered 2021-12-03 – 2021-12-07 (×5): 81 mg via ORAL
  Filled 2021-12-03 (×5): qty 1

## 2021-12-03 MED ORDER — TRAZODONE HCL 50 MG PO TABS
25.0000 mg | ORAL_TABLET | Freq: Every evening | ORAL | Status: DC | PRN
  Administered 2021-12-04 – 2021-12-06 (×3): 25 mg via ORAL
  Filled 2021-12-03 (×4): qty 1

## 2021-12-03 MED ORDER — ACETAMINOPHEN 325 MG PO TABS
650.0000 mg | ORAL_TABLET | Freq: Four times a day (QID) | ORAL | Status: DC | PRN
  Administered 2021-12-03 – 2021-12-04 (×2): 650 mg via ORAL
  Filled 2021-12-03 (×2): qty 2

## 2021-12-03 MED ORDER — SODIUM CHLORIDE 0.9 % IV SOLN: INTRAVENOUS | Status: DC

## 2021-12-03 MED ORDER — INSULIN ASPART 100 UNIT/ML IJ SOLN
0.0000 [IU] | Freq: Every day | INTRAMUSCULAR | Status: DC
  Administered 2021-12-03 – 2021-12-06 (×2): 2 [IU] via SUBCUTANEOUS
  Filled 2021-12-03 (×2): qty 1

## 2021-12-03 MED ORDER — IPRATROPIUM-ALBUTEROL 0.5-2.5 (3) MG/3ML IN SOLN
3.0000 mL | Freq: Four times a day (QID) | RESPIRATORY_TRACT | Status: DC
  Administered 2021-12-03: 3 mL via RESPIRATORY_TRACT
  Filled 2021-12-03: qty 3

## 2021-12-03 MED ORDER — METOCLOPRAMIDE HCL 5 MG/ML IJ SOLN: 5.0000 mg | Freq: Four times a day (QID) | INTRAMUSCULAR | Status: DC | PRN

## 2021-12-03 MED ORDER — ONDANSETRON HCL 4 MG PO TABS: 4.0000 mg | ORAL_TABLET | Freq: Four times a day (QID) | ORAL | Status: DC | PRN

## 2021-12-03 MED ORDER — IPRATROPIUM-ALBUTEROL 0.5-2.5 (3) MG/3ML IN SOLN
3.0000 mL | Freq: Two times a day (BID) | RESPIRATORY_TRACT | Status: DC
  Administered 2021-12-03 – 2021-12-04 (×2): 3 mL via RESPIRATORY_TRACT
  Filled 2021-12-03 (×2): qty 3

## 2021-12-03 MED ORDER — ONDANSETRON HCL 4 MG/2ML IJ SOLN: 4.0000 mg | Freq: Four times a day (QID) | INTRAMUSCULAR | Status: DC | PRN

## 2021-12-03 MED ORDER — SODIUM CHLORIDE 0.9 % IV SOLN: 2.0000 g | INTRAVENOUS | Status: DC

## 2021-12-03 MED ORDER — SODIUM CHLORIDE 0.9 % IV SOLN: 500.0000 mg | INTRAVENOUS | Status: DC

## 2021-12-03 MED ORDER — INSULIN ASPART 100 UNIT/ML IJ SOLN
0.0000 [IU] | Freq: Three times a day (TID) | INTRAMUSCULAR | Status: DC
  Administered 2021-12-03: 17:00:00 1 [IU] via SUBCUTANEOUS
  Administered 2021-12-04 – 2021-12-05 (×3): 2 [IU] via SUBCUTANEOUS
  Administered 2021-12-05: 17:00:00 1 [IU] via SUBCUTANEOUS
  Administered 2021-12-06: 2 [IU] via SUBCUTANEOUS
  Administered 2021-12-06: 1 [IU] via SUBCUTANEOUS
  Administered 2021-12-06: 2 [IU] via SUBCUTANEOUS
  Administered 2021-12-07 (×2): 1 [IU] via SUBCUTANEOUS
  Filled 2021-12-03 (×10): qty 1

## 2021-12-03 MED ORDER — GABAPENTIN 100 MG PO CAPS
200.0000 mg | ORAL_CAPSULE | Freq: Three times a day (TID) | ORAL | Status: DC
  Administered 2021-12-03 – 2021-12-07 (×13): 200 mg via ORAL
  Filled 2021-12-03 (×13): qty 2

## 2021-12-03 MED ORDER — ENOXAPARIN SODIUM 30 MG/0.3ML IJ SOSY
30.0000 mg | PREFILLED_SYRINGE | INTRAMUSCULAR | Status: DC
  Administered 2021-12-03 – 2021-12-07 (×5): 30 mg via SUBCUTANEOUS
  Filled 2021-12-03 (×5): qty 0.3

## 2021-12-03 MED ORDER — METOPROLOL SUCCINATE ER 50 MG PO TB24
50.0000 mg | ORAL_TABLET | Freq: Two times a day (BID) | ORAL | Status: DC
  Administered 2021-12-03 – 2021-12-07 (×9): 50 mg via ORAL
  Filled 2021-12-03 (×9): qty 1

## 2021-12-03 MED ORDER — GUAIFENESIN ER 600 MG PO TB12
600.0000 mg | ORAL_TABLET | Freq: Two times a day (BID) | ORAL | Status: DC
  Administered 2021-12-03 – 2021-12-07 (×9): 600 mg via ORAL
  Filled 2021-12-03 (×9): qty 1

## 2021-12-03 MED ORDER — LEVOTHYROXINE SODIUM 50 MCG PO TABS
50.0000 ug | ORAL_TABLET | Freq: Every day | ORAL | Status: DC
  Administered 2021-12-03 – 2021-12-07 (×5): 50 ug via ORAL
  Filled 2021-12-03 (×5): qty 1

## 2021-12-03 MED ORDER — ACETAMINOPHEN 650 MG RE SUPP: 650.0000 mg | Freq: Four times a day (QID) | RECTAL | Status: DC | PRN

## 2021-12-03 MED ORDER — INSULIN GLARGINE-YFGN 100 UNIT/ML ~~LOC~~ SOLN
15.0000 [IU] | Freq: Every day | SUBCUTANEOUS | Status: DC
  Administered 2021-12-04 – 2021-12-07 (×4): 15 [IU] via SUBCUTANEOUS
  Filled 2021-12-03 (×6): qty 0.15

## 2021-12-03 NOTE — Progress Notes (Signed)
Care started prior to midnight in the emergency room and patient was admitted early this morning after midnight by Dr. Eugenie Norrie and I am in current agreement with his assessment and plan.  Additional changes to plan of care been made accordingly.  The patient is a 88 year old elderly Caucasian female with a past medical history significant for but #2 asthma, hypertension, hypothyroidism, history of dementia, stage III chronic kidney disease, history of dyslipidemia, arthritis, type II days mellitus as well as other comorbidities who presented to the ED with acute generalized eyes onset of weakness and altered mental status and confusion.  She has not been eating or drinking much lately and she appeared to be short of breath with associated cough and chest swelling with inability to expectorate.  She had a T-max of 101 at home with associated chills which she was given p.o. Tylenol.  Has not been having much urine output with diminished p.o. intake and admits to having significant dysuria and burning on her urine.  Because of the symptoms she presented to the ED and respiratory rate was noted to be 26 and a pulse oximetry was 88%.  Is placed on 2 L of supplemental oxygen via nasal cannula and her O2 saturation improved 94%.  Temperature is normal at at 99.3.  Labs revealed a leukocytosis of 13 with neutrophilia and a BMP showed an elevated BUN/creatinine from her prior baseline.  Imaging done and showed a chest x-ray which showed multifocal patchy opacities in the left upper lobe predominant favoring multifocal pneumonia.  She is given IV ceftriaxone and azithromycin as well as 2 half liters of IV lactated ringer boluses and then placed on 150 MLS per hour.  She is admitted to the medical telemetry bed for the further evaluation management of sepsis secondary to multifocal pneumonia as well as concomitant UTI.  Currently she is being admitted and treated for the following but not noted to:  Sepsis secondary to  multifocal pneumonia with concomitant UTI manifested by leukocytosis and tachypnea -She was admitted to the medical telemetry -Initiated on antibiotics with IV ceftriaxone and azithromycin which will be continued; because strep pneumo was positive we will discontinue azithromycin and continue ceftriaxone -She is given mucolytic therapy and bronchodilators with DuoNebs -Obtain sputum culture and follow blood cultures -WBC went from 13.0 -> 12.6 -LA went from 3.6 -> 1.7 -Obtain urine pneumonia antigen and strep pneumo was positive -Is given IV fluid hydration with normal saline -Continue to follow cultures and repeat WBC and trend fever curve -Repeat chest x-ray in a.m.  Acute hypoxic respiratory failure in the setting of multifocal pneumonia -SpO2: 94 % O2 Flow Rate (L/min): 2 L/min -Continuous pulse oximetry maintain O2 saturation greater 90% -Continue supplemental oxygen via nasal cannula wean O2 as tolerated -She will need an ambulatory home O2 screen prior to discharge -Repeat chest x-ray in a.m. and may consider chest CT scan if not improving  Acute UTI  -Urinalysis showed cloudy appearance with yellow color urine, trace hemoglobin, large ketones, negative nitrites, trace protein, many bacteria, present mucus, present hyaline cast, 0-5 RBCs per high-power field, 21-50 WBCs -Urine cultures pending -Continue with empiric IV ceftriaxone as above and follow blood cultures -We will add Pyridium given her dysuria and burning  AKI on CKD stage IIIa -She is given IV fluid hydration with normal and currently getting maintenance rate at 100 MLS per hour -Patient's BUNs/creatinine went from 25/1.3 is now improved to 22/1.04 -Avoid further nephrotoxic medications, contrast dyes, hypotension and renally  adjust medications -Repeat CMP in the a.m.  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  We will continue to monitor patient's clinical response to intervention and repeat blood work and imaging in the a.m.

## 2021-12-03 NOTE — TOC Initial Note (Signed)
Transition of Care [facility]) - Initial/Assessment Note    Patient Details  Name: Crystal Delacruz MRN: [id number] Date of Birth: May 15, 1924  Transition of Care [facility]) CM/SW Contact:    Pete Pelt, RN Phone Number: 12/03/2021, 9:13 AM  Clinical Narrative:    Patient lives at home with 88 year old spouse who is her primary caregiver.  Son assists with care.        Son and spouse report that there are no concerns with transporting patient to appointments, and she is caught up on all of her appointments to date, including PCP.  There are no current concerns with obtaining medications and spouse and son assist with medication administration as directed.  Son and spouse request home health, as they state this would assist patient at home with care.  They would like an aide and therapy, if required.  Request sent to care team.    TOC contact information provide, TOC to follow to discharge.         Expected Discharge Plan:  (TBD) Barriers to Discharge: Continued Medical Work up   Patient Goals and CMS Choice        Expected Discharge Plan and Services Expected Discharge Plan:  (TBD)   Discharge Planning Services: CM Consult Post Acute Care Choice:  (would like [facility]) Living arrangements for the past 2 months: Single Family Home                                      Prior Living Arrangements/Services Living arrangements for the past 2 months: Single Family Home Lives with:: Self, Spouse Patient language and need for interpreter reviewed:: Yes (Spoke to son and spouse, as patient was not alert and oriented) Do you feel safe going back to the place where you live?: Yes      Need for Family Participation in Patient Care: Yes (Comment) Care giver support system in place?: Yes (comment)   Criminal Activity/Legal Involvement Pertinent to Current Situation/Hospitalization: No - Comment as needed  Activities of Daily Living Home Assistive Devices/Equipment: Eyeglasses, Hearing aid,  Wheelchair ADL Screening (condition at time of admission) Patient's cognitive ability adequate to safely complete daily activities?: Yes Is the patient deaf or have difficulty hearing?: Yes Does the patient have difficulty seeing, even when wearing glasses/contacts?: Yes Does the patient have difficulty concentrating, remembering, or making decisions?: No Patient able to express need for assistance with ADLs?: Yes Does the patient have difficulty dressing or bathing?: Yes Independently performs ADLs?: No Communication: Independent Dressing (OT): Dependent Grooming: Needs assistance Feeding: Needs assistance Bathing: Dependent Toileting: Dependent In/Out Bed: Dependent Walks in Home: Dependent Does the patient have difficulty walking or climbing stairs?: Yes Weakness of Legs: Both Weakness of Arms/Hands: None  Permission Sought/Granted Permission sought to share information with : Case Manager Permission granted to share information with : Yes, Verbal Permission Granted     Permission granted to share info w AGENCY: Potential agency or facility contact        Emotional Assessment Appearance:: Appears stated age Attitude/Demeanor/Rapport: Gracious   Orientation: : Oriented to Self, Oriented to Place Alcohol / Substance Use: Not Applicable Psych Involvement: No (comment)  Admission diagnosis:  Delirium [R41.0] Prolonged QT interval [R94.31] Acute kidney injury ([place]) [N17.9] Chronic dementia ([place]) [F03.90] Urinary tract infection without hematuria, site unspecified [N39.0] Acute respiratory failure with hypoxemia ([place]) [J96.01] Multifocal pneumonia [J18.9] Sepsis, due to unspecified organism, unspecified  whether acute organ dysfunction present [facility]) [A41.9] Patient Active Problem List   Diagnosis Date Noted   Multifocal pneumonia 12/03/2021   UTI (urinary tract infection) 10/18/2018   Diabetes mellitus with stage 3 chronic kidney disease ([place]) 03/21/2014   Hypothyroid  03/21/2014   Hyperlipidemia 03/21/2014   PCP:  Kirk Ruths, MD Pharmacy:   [place] [place] [address] Phone: [phone] Fax: [phone]     Social Determinants of Health (SDOH) Interventions    Readmission Risk Interventions No flowsheet data found.

## 2021-12-03 NOTE — Evaluation (Addendum)
Physical Therapy Evaluation Patient Details Name: Crystal Delacruz MRN: [id number] DOB: 13-Aug-1924 Today's Date: 12/03/2021  History of Present Illness  88 y.o. female with medical history significant for asthma, hypertension, hypothyroidism, dementia, stage IIIa chronic kidney disease, dyslipidemia, osteoarthritis and type 2 diabetes mellitus, who presented to emergency room with acute onset of generalized weakness and altered mental status.    Clinical Impression  Pt received in Semi-Fowler's position asleep, but when awakened, agreeable to therapy.  Pt slightly confused at times, however follows directions well.  Pt participated in limited bed-level exercises, at times becoming side-tracked, but able to be re-directed.  Pt able to perform bed mobility and transfers much better than during previous therapy session.  Pt still requires some assistance from therapist for STS, however is making progress.  Pt has much of necessary requirements at home for safe d/c, however due to lack of functional mobility demonstrated at initial evaluation, anticipate SNF being most appropriate placement at this time.  Therapist anticipates pt making progress towards goals and becoming safe to d/c home by end of hospital stay.     Recommendations for follow up therapy are one component of a multi-disciplinary discharge planning process, led by the attending physician.  Recommendations may be updated based on patient status, additional functional criteria and insurance authorization.  Follow Up Recommendations Skilled nursing-short term rehab (<3 hours/day)    Assistance Recommended at Discharge Frequent or constant Supervision/Assistance  Patient can return home with the following  A little help with walking and/or transfers;A little help with bathing/dressing/bathroom;Assistance with cooking/housework;Help with stairs or ramp for entrance    Equipment Recommendations None recommended by PT  Recommendations for  Other Services       Functional Status Assessment       Precautions / Restrictions Precautions Precautions: Fall Restrictions Weight Bearing Restrictions: No      Mobility  Bed Mobility Overal bed mobility: Needs Assistance Bed Mobility: Supine to Sit, Sit to Supine     Supine to sit: HOB elevated, Min assist Sit to supine: Mod assist   General bed mobility comments: cues for bed rail use, sequencing    Transfers Overall transfer level: Needs assistance Equipment used: Rolling walker (2 wheels) Transfers: Sit to/from Stand Sit to Stand: Min guard, From elevated surface, Min assist           General transfer comment: verbal cues for hand placement and slight assistance from therapist under L UE.  Pt performed much better than with previous therapy from a mobility standpoint.    Ambulation/Gait Ambulation/Gait assistance: Min guard Gait Distance (Feet): 4 Feet Assistive device: Rolling walker (2 wheels) Gait Pattern/deviations: Step-to pattern Gait velocity: decreased     General Gait Details: Pt sidestepping at EOB in order to get in better placement for bed transfer.  Stairs            Wheelchair Mobility    Modified Rankin (Stroke Patients Only)       Balance Overall balance assessment: Needs assistance, History of Falls Sitting-balance support: Bilateral upper extremity supported, Single extremity supported, Feet supported Sitting balance-Leahy Scale: Fair Sitting balance - Comments: initial CGA and PRN VC to improve intermittent posterior lean Postural control: Posterior lean Standing balance support: Bilateral upper extremity supported, Reliant on assistive device for balance Standing balance-Leahy Scale: Poor Standing balance comment: pt requires AD for side steps and upright posture.  Pertinent Vitals/Pain Pain Assessment Pain Assessment: No/denies pain    Home Living Family/patient expects to  be discharged to:: Private residence Living Arrangements: Spouse/significant other Available Help at Discharge: Family;Available 24 hours/day Type of Home: House Home Access: Elevator       Home Layout: One level Home Equipment: Conservation officer, nature (2 wheels);BSC/3in1;Hand held shower head;Grab bars - tub/shower;Shower seat;Wheelchair - Education administrator (comment) Additional Comments: has a Risk analyst    Prior Function Prior Level of Function : Needs assist;History of Falls (last six months)             Mobility Comments: Pt required assist for transfers + RW, used lift recliner to stand with RW to get to bathroom, w/c for out of the home/longer distances ADLs Comments: Pt reports having help for "nearly everything", daughter endorses pt's son and daughter in law live next door and assist with most meals, transportation, bathing, and dressing. Spouse manages medications. 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[employer])     [place]: Right    Extremity/Trunk Assessment   Upper Extremity Assessment Upper Extremity Assessment: Generalized weakness    Lower Extremity Assessment Lower Extremity Assessment: Generalized weakness    Cervical / Trunk Assessment Cervical / Trunk Assessment: Other exceptions Cervical / Trunk Exceptions: hx back surgeries  Communication   Communication: HOH  Cognition Arousal/Alertness: Awake/alert Behavior During Therapy: WFL for tasks assessed/performed Overall Cognitive Status: Impaired/Different from baseline Area of Impairment: Orientation, Problem solving, Following commands                       Following Commands: Follows multi-step commands inconsistently, Follows one step commands consistently     Problem Solving: Slow processing, Requires verbal cues, Requires tactile cues General Comments: A bit fearful of falling, HOH, slower processing time, appears a bit confused about PLOF/set up        General  Comments      Exercises Other Exercises Other Exercises: Pt educated on role of PT and services provided during hospital stay.  Pt also encouraged to perform exercises in order to decrease muscle atrophy during stay.   Assessment/Plan    PT Assessment Patient needs continued PT services  PT Problem List Decreased strength;Decreased activity tolerance;Decreased balance;Decreased mobility;Decreased knowledge of use of DME;Decreased safety awareness       PT Treatment Interventions DME instruction;Gait training;Functional mobility training;Therapeutic activities;Therapeutic exercise;Balance training    PT Goals (Current goals can be found in the Care Plan section)  Acute Rehab PT Goals Patient Stated Goal: to go home. PT Goal Formulation: With patient/family Time For Goal Achievement: 12/17/21 Potential to Achieve Goals: Fair    Frequency Min 2X/week     Co-evaluation               AM-PAC PT "6 Clicks" Mobility  Outcome Measure Help needed turning from your back to your side while in a flat bed without using bedrails?: A Little Help needed moving from lying on your back to sitting on the side of a flat bed without using bedrails?: A Little Help needed moving to and from a bed to a chair (including a wheelchair)?: A Lot Help needed standing up from a chair using your arms (e.g., wheelchair or bedside chair)?: A Lot Help needed to walk in hospital room?: A Little Help needed climbing 3-5 steps with a railing? : A Lot 6 Click Score: 15    End of Session Equipment Utilized During Treatment: Gait belt  Activity Tolerance: Patient tolerated treatment well Patient left: in bed;with call bell/phone within reach;with bed alarm set;with family/visitor present Nurse Communication: Mobility status PT Visit Diagnosis: Unsteadiness on feet (R26.81);Other abnormalities of gait and mobility (R26.89);Muscle weakness (generalized) (M62.81);History of falling (Z91.81);Difficulty in walking,  not elsewhere classified (R26.2)    Time: 7622-6333 PT Time Calculation (min) (ACUTE ONLY): 59 min   Charges:   PT Evaluation $PT Eval Moderate Complexity: 1 Mod PT Treatments $Gait Training: 8-22 mins $Therapeutic Exercise: 8-22 mins $Therapeutic Activity: 8-22 mins        Gwenlyn Saran, PT, DPT 12/03/21, 5:59 PM   Christie Nottingham 12/03/2021, 5:54 PM

## 2021-12-03 NOTE — H&P (Addendum)
[facility]   PATIENT NAME: Crystal Delacruz    MR#:  [id number]  DATE OF BIRTH:  [birth year]  DATE OF ADMISSION:  12/02/2021  PRIMARY CARE PHYSICIAN: Kirk Ruths, MD   Patient is coming from: Home  REQUESTING/REFERRING PHYSICIAN: Hinda Kehr, MD  CHIEF COMPLAINT:   Chief Complaint  Patient presents with   Weakness   Altered Mental Status    HISTORY OF PRESENT ILLNESS:  Crystal Delacruz is a 88 y.o. Caucasian female with medical history significant for asthma, hypertension, hypothyroidism, dementia, stage IIIa chronic kidney disease, dyslipidemia, osteoarthritis and type 2 diabetes mellitus, who presented to emergency room with acute onset of generalized weakness and altered mental status.  The patient has not been eating or drinking much lately.  She appeared to be short of breath with associated cough and chest rattling with inability to expectorate.  She had a T-max of 101 at home with associated chills for which she was given p.o. Tylenol.  She has not been having much urine output with diminished p.o. fluid intake.  No dysuria, hematuria or flank pain.  ED Course: Upon presentation to the emergency room, respiratory rate was 26 and pulse oximetry was 88% on room air and 94% on 2 L of O2 by nasal cannula.  Temperature was normal later 99.3.  Labs revealed leukocytosis of 13 with neutrophilia and BMP showed a BUN of 25 and creatinine 1.38 above previous levels with low chloride of 95.  High-sensitivity troponin I was 8.  Lactic acid was 3.6.  EKG as reviewed by me :  EKG showed sinus rhythm with a rate of 65 with PVCs. Imaging: 2 view chest x-ray showed multifocal patchy opacities in the left upper lobe predominantly favoring multifocal pneumonia.  The patient was given IV Rocephin and Zithromax as well as 2.5 L of IV lactated Ringer for 150 mill per hour.  She will be admitted to a medical telemetry bed for further evaluation and management. PAST MEDICAL HISTORY:   Past  Medical History:  Diagnosis Date   Arthritis    Asthma    Diabetes mellitus without complication ([place])    Hypertension    Hypothyroidism    Squamous cell carcinoma of skin 09/29/2018   L lower leg   Squamous cell carcinoma of skin 01/07/2020   R mid dorsum forearm   Squamous cell carcinoma of skin 12/19/2020   Left forearm (in situ), [facility]   Squamous cell carcinoma of skin 06/21/2021   right bicep, [facility]   Squamous cell carcinoma of skin 06/21/2021   right mid dorsum forearm, [facility]  -Stage III chronic kidney disease - Dyslipidemia. -Dementia PAST SURGICAL HISTORY:  -Cholecystectomy - Back surgeries. - Appendectomy. SOCIAL HISTORY:   Social History   Tobacco Use   Smoking status: Never   Smokeless tobacco: Never  Substance Use Topics   Alcohol use: No    FAMILY HISTORY:  Positive for cancer in her mother. DRUG ALLERGIES:   Allergies  Allergen Reactions   Elemental Sulfur    Latex Swelling   Morphine And Related     REVIEW OF SYSTEMS:   ROS per her husband and son: As per history of present illness. All pertinent systems were reviewed above. Constitutional, HEENT, cardiovascular, respiratory, GI, GU, musculoskeletal, neuro, psychiatric, endocrine, integumentary and hematologic systems were reviewed and are otherwise negative/unremarkable except for positive findings mentioned above in the HPI.   MEDICATIONS AT HOME:   Prior to Admission medications   Medication Sig  Start Date End Date Taking? Authorizing Provider  acetaminophen (TYLENOL) 500 MG tablet Take 500 mg by mouth every 6 (six) hours as needed.   Yes [provider]  aspirin EC 81 MG tablet Take 81 mg by mouth daily.   Yes [provider]  gabapentin (NEURONTIN) 100 MG capsule Take 200 mg by mouth 3 (three) times daily. 07/31/18  Yes [provider]  glimepiride (AMARYL) 4 MG tablet Take 4 mg by mouth in the morning and at bedtime. 10/20/21  Yes [provider]  LANTUS  SOLOSTAR 100 UNIT/ML Solostar Pen Inject 15 Units into the skin daily. 09/07/21  Yes [provider]  levothyroxine (SYNTHROID, LEVOTHROID) 50 MCG tablet Take 50 mcg by mouth daily. 08/08/18  Yes [provider]  metoprolol succinate (TOPROL-XL) 50 MG 24 hr tablet Take 50 mg by mouth 2 (two) times daily. 09/14/21  Yes [provider]  nortriptyline (PAMELOR) 25 MG capsule Take 25 mg by mouth 2 (two) times daily. 10/13/18  Yes [provider]  torsemide (DEMADEX) 20 MG tablet Take 40 mg by mouth daily. 09/05/18  Yes [provider]  mometasone (ELOCON) 0.1 % cream Apply to legs for rash at bedtime Mon-Fri Patient not taking: Reported on 12/03/2021 09/21/21   Ralene Bathe, MD      VITAL SIGNS:  Blood pressure (!) 115/57, pulse 60, temperature 99.2 F (37.3 C), temperature source Oral, resp. rate 17, height 5\' 7"  (1.702 m), SpO2 99 %.  PHYSICAL EXAMINATION:  Physical Exam  GENERAL:  88 y.o.-year-old Caucasian female patient lying in the bed with no acute distress.  EYES: Pupils equal, round, reactive to light and accommodation. No scleral icterus. Extraocular muscles intact.  HEENT: Head atraumatic, normocephalic. Oropharynx and nasopharynx clear.  NECK:  Supple, no jugular venous distention. No thyroid enlargement, no tenderness.  LUNGS: Diminished bibasilar breath sounds with bibasal crackles.  No use of accessory muscles of respiration.  CARDIOVASCULAR: Regular rate and rhythm, S1, S2 normal. No murmurs, rubs, or gallops.  ABDOMEN: Soft, nondistended, nontender. Bowel sounds present. No organomegaly or mass.  EXTREMITIES: 2+ bilateral lower extremity pitting feet, ankles and leg edema with no cyanosis, or clubbing.  NEUROLOGIC: Cranial nerves II through XII are intact. Muscle strength 5/5 in all extremities. Sensation intact. Gait not checked.  PSYCHIATRIC: The patient is alert and oriented x 3.  Normal affect and good eye contact. SKIN: No  obvious rash, lesion, or ulcer.   LABORATORY PANEL:   CBC Recent Labs  Lab 12/02/21 2147  WBC 13.0*  HGB 12.8  HCT 42.0  PLT 214   ------------------------------------------------------------------------------------------------------------------  Chemistries  Recent Labs  Lab 12/02/21 2147  NA 137  K 4.4  CL 95*  CO2 31  GLUCOSE 269*  BUN 25*  CREATININE 1.38*  CALCIUM 8.7*   ------------------------------------------------------------------------------------------------------------------  Cardiac Enzymes No results for input(s): TROPONINI in the last 168 hours. ------------------------------------------------------------------------------------------------------------------  RADIOLOGY:  DG Chest 2 View  Result Date: 12/02/2021 CLINICAL DATA:  Shortness of breath EXAM: CHEST - 2 VIEW COMPARISON:  10/21/2018 FINDINGS: Multifocal patchy opacities, left upper lobe predominant, favoring multifocal pneumonia. No definite pleural effusions. No pneumothorax. The heart is normal in size. Degenerative changes of the visualized thoracolumbar spine. Lumbar spine fixation hardware. Cholecystectomy clips. IMPRESSION: Multifocal patchy opacities, left upper lobe predominant, favoring multifocal pneumonia. Electronically Signed   By: Julian Hy M.D.   On: 12/02/2021 22:19      IMPRESSION AND PLAN:  Principal Problem:   Multifocal pneumonia  1.  Multifocal pneumonia with subsequent sepsis as manifested by leukocytosis and tachypnea. - The patient will be admitted to a medical telemetry bed. - We will continue antibiotic therapy with IV Rocephin and Zithromax. - Mucolytic therapy will be provided. - Bronchodilator therapy with DuoNebs will be provided. - We will obtain sputum culture and follow blood cultures. - We will obtain urine pneumonia antigens. - The patient will be hydrated with IV normal saline.  2.  Acute hypoxic respiratory failure secondary to 1. - O2  protocol will be followed. - Management otherwise as above.  3.  UTI, possibly contributing to her sepsis. - This should be covered by IV Rocephin. - Urine culture will be followed.  4.  Acute kidney injury superimposed on stage IIIa chronic kidney disease. - The patient will be hydrated with IV normal saline. - We will follow BMP. - We will avoid nephrotoxins.  5.  Type 2 diabetes mellitus with peripheral neuropathy. - We will place the patient on supplement coverage with NovoLog and continue Amaryl. -We will continue basal coverage. - We will continue Neurontin.  6.  Hypothyroidism. - We will continue Synthroid.  7.  Essential hypertension. - Continue Toprol-XL.  DVT prophylaxis: Lovenox.  Code Status: full code.  Family Communication:  The plan of care was discussed in details with the patient (and family). I answered all questions. The patient agreed to proceed with the above mentioned plan. Further management will depend upon hospital course. Disposition Plan: Back to previous home environment Consults called: none. All the records are reviewed and case discussed with ED provider.  Status is: Inpatient   At the time of the admission, it appears that the appropriate admission status for this patient is inpatient.  This is judged to be reasonable and necessary in order to provide the required intensity of service to ensure the patient's safety given the presenting symptoms, physical exam findings and initial radiographic and laboratory data in the context of comorbid conditions.  The patient requires inpatient status due to high intensity of service, high risk of further deterioration and high frequency of surveillance required.  I certify that at the time of admission, it is my clinical judgment that the patient will require inpatient hospital care extending more than 2 midnights.                            Dispo: The patient is from: Home              Anticipated d/c is  to: Home              Patient currently is not medically stable to d/c.              Difficult to place patient: No   Christel Mormon M.D on 12/03/2021 at 12:31 AM  [facility]   From 7 PM-7 AM, contact night-coverage [email or web address]  CC: Primary care physician; Kirk Ruths, MD

## 2021-12-03 NOTE — Progress Notes (Signed)
PHARMACIST - PHYSICIAN COMMUNICATION  CONCERNING:  Enoxaparin (Lovenox) for DVT Prophylaxis    RECOMMENDATION: Patient was prescribed enoxaprin 40mg  q24 hours for VTE prophylaxis.   There were no vitals filed for this visit.  Body mass index is 27.88 kg/m.  CrCl cannot be calculated (Unknown ideal weight.).  Patient is candidate for enoxaparin 30mg  every 24 hours based on CrCl <52ml/min or Weight <45kg  DESCRIPTION: Pharmacy has adjusted enoxaparin dose per [facility] policy.  Patient is now receiving enoxaparin 30 mg every 24 hours   Renda Rolls, PharmD, [facility] 12/03/2021 12:39 AM

## 2021-12-03 NOTE — Evaluation (Signed)
Occupational Therapy Evaluation Patient Details Name: Crystal Delacruz MRN: [id number] DOB: [birth year] Today's Date: 12/03/2021   History of Present Illness 88 y.o. female with medical history significant for asthma, hypertension, hypothyroidism, dementia, stage IIIa chronic kidney disease, dyslipidemia, osteoarthritis and type 2 diabetes mellitus, who presented to emergency room with acute onset of generalized weakness and altered mental status.   Clinical Impression   Pt was seen for OT evaluation this date. Prior to hospital admission, pt was living in a home with elevator access with her spouse as caregiver (medication mgt) and son and daughter in law living next door and providing assist for bathing, meals, transportation, and dressing. Daughter and son in law present and able to assist with PLOF. Pt was ambulating short HH distances with a 2WW at baseline and needing increased assistance for transfers (from bed, used lift recliner to help her stand otherwise). Pt endorses at least 1 fall in past 30mo where she slid out of her chair. Currently pt demonstrates impairments as described below (See OT problem list) which functionally limit her ability to perform ADL/self-care tasks. Pt currently requires MAX A for LB ADL, MIN-MOD A for UB ADL, MOD A for bed mobility, and MAX A +2 for ADL transfers EOB + RW + VC for sequencing and hand placement. Pt fearful of falling. On 2L O2 throughout, SpO2 >93% throughout. Pt/family instructed in ADL transfers, falls prevention. Pt would benefit from skilled OT services to address noted impairments and functional limitations (see below for any additional details) in order to maximize safety and independence while minimizing falls risk and caregiver burden. Upon hospital discharge, recommend STR to maximize pt safety and return to PLOF. Family shares that they wish to return home. In order to return home safety, pt would require significant assist for ADL and transfers,  including considering a STS lift.    Recommendations for follow up therapy are one component of a multi-disciplinary discharge planning process, led by the attending physician.  Recommendations may be updated based on patient status, additional functional criteria and insurance authorization.   Follow Up Recommendations  Skilled nursing-short term rehab (<3 hours/day)    Assistance Recommended at Discharge Frequent or constant Supervision/Assistance  Patient can return home with the following Two people to help with walking and/or transfers;Two people to help with bathing/dressing/bathroom;Assistance with cooking/housework;Assist for transportation;Help with stairs or ramp for entrance;Direct supervision/assist for medications management    Functional Status Assessment  Patient has had a recent decline in their functional status and demonstrates the ability to make significant improvements in function in a reasonable and predictable amount of time.  Equipment Recommendations  None recommended by OT    Recommendations for Other Services       Precautions / Restrictions Precautions Precautions: Fall Restrictions Weight Bearing Restrictions: No      Mobility Bed Mobility Overal bed mobility: Needs Assistance Bed Mobility: Supine to Sit, Sit to Supine     Supine to sit: HOB elevated, Mod assist, Min assist Sit to supine: Max assist   General bed mobility comments: cues for bed rail use, sequencing    Transfers Overall transfer level: Needs assistance Equipment used: Rolling walker (2 wheels) Transfers: Sit to/from Stand Sit to Stand: Max assist, +2 physical assistance, From elevated surface           General transfer comment: VC for hand placement, 1st attempt unable to clear buttocks with MAX A x1; 2nd attempt MAX A +2 + RW and able to take 1 side  step towards HOB; fearful of falling      Balance Overall balance assessment: Needs assistance, History of  Falls Sitting-balance support: Bilateral upper extremity supported, Single extremity supported, Feet supported Sitting balance-Leahy Scale: Fair Sitting balance - Comments: initial CGA and PRN VC to improve intermittent posterior lean Postural control: Posterior lean Standing balance support: Bilateral upper extremity supported, Reliant on assistive device for balance Standing balance-Leahy Scale: Zero Standing balance comment: required heavy +2 assist to maintain standing and for side step                           ADL either performed or assessed with clinical judgement   ADL Overall ADL's : Needs assistance/impaired                                       General ADL Comments: Pt currently requires MAX A for LB ADL, MAX A +2 for ADL transfers, and seated MIN - MOD A for UB ADL tasks in unsupported seated position. PRN set up assist for supported sitting meals/grooming.     Vision         Perception     Praxis      Pertinent Vitals/Pain Pain Assessment Pain Assessment: No/denies pain     Hand Dominance     Extremity/Trunk Assessment Upper Extremity Assessment Upper Extremity Assessment: Generalized weakness   Lower Extremity Assessment Lower Extremity Assessment: Generalized weakness (BLE edema, dtr reports appears to be improving)   Cervical / Trunk Assessment Cervical / Trunk Assessment: Other exceptions Cervical / Trunk Exceptions: hx back surgeries   Communication Communication Communication: HOH   Cognition Arousal/Alertness: Awake/alert Behavior During Therapy: WFL for tasks assessed/performed Overall Cognitive Status: Impaired/Different from baseline Area of Impairment: Orientation, Problem solving, Following commands                       Following Commands: Follows multi-step commands inconsistently, Follows one step commands consistently     Problem Solving: Slow processing, Requires verbal cues, Requires tactile  cues General Comments: A bit fearful of falling, HOH, slower processing time, appears a bit confused about PLOF/set up     General Comments       Exercises Other Exercises Other Exercises: ADL transfer training, RW mgt, falls prevention   Shoulder Instructions      Home Living Family/patient expects to be discharged to:: Private residence Living Arrangements: Spouse/significant other Available Help at Discharge: Family;Available 24 hours/day Type of Home: House Home Access: Elevator     Home Layout: One level     Bathroom Shower/Tub: Teacher, early years/pre: Handicapped height     Home Equipment: Conservation officer, nature (2 wheels);BSC/3in1;Hand held shower head;Grab bars - tub/shower;Shower seat;Wheelchair - Education administrator (comment)   Additional Comments: has a Risk analyst      Prior Functioning/Environment Prior Level of Function : Needs assist;History of Falls (last six months)             Mobility Comments: Pt required assist for transfers + RW, used lift recliner to stand with RW to get to bathroom, w/c for out of the home/longer distances ADLs Comments: Pt reports having help for "nearly everything", daughter endorses pt's son and daughter in law live next door and assist with most meals, transportation, bathing, and dressing. Spouse manages medications. 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[place])        OT Problem List: Decreased strength;Decreased activity tolerance;Impaired balance (sitting and/or standing);Decreased safety awareness;Decreased cognition;Cardiopulmonary status limiting activity;Decreased knowledge of use of DME or AE      OT Treatment/Interventions: Self-care/ADL training;Therapeutic exercise;Therapeutic activities;DME and/or AE instruction;Energy conservation;Patient/family education;Balance training;Cognitive remediation/compensation    OT Goals(Current goals can be found in the care plan section) Acute Rehab OT Goals Patient  Stated Goal: go home wiht family OT Goal Formulation: With patient/family Time For Goal Achievement: 12/17/21 Potential to Achieve Goals: Good ADL Goals Pt Will Transfer to Toilet: with mod assist;bedside commode;stand pivot transfer (LRAD) Additional ADL Goal #1: Pt will perform seated grooming tasks with supported back and set up. Additional ADL Goal #2: Pt will perform bed mobility with MIN-MOD A to sit EOB in anticipation of seated ADL.  OT Frequency: Min 2X/week    Co-evaluation              AM-PAC OT "6 Clicks" Daily Activity     Outcome Measure Help from another person eating meals?: None Help from another person taking care of personal grooming?: A Little Help from another person toileting, which includes using toliet, bedpan, or urinal?: Total Help from another person bathing (including washing, rinsing, drying)?: A Lot Help from another person to put on and taking off regular upper body clothing?: A Little Help from another person to put on and taking off regular lower body clothing?: A Lot 6 Click Score: 15   End of Session Equipment Utilized During Treatment: Gait belt;Oxygen;Rolling walker (2 wheels) Nurse Communication: Mobility status  Activity Tolerance: Patient tolerated treatment well Patient left: in bed;with call bell/phone within reach;with bed alarm set;with family/visitor present  OT Visit Diagnosis: Other abnormalities of gait and mobility (R26.89);History of falling (Z91.81);Muscle weakness (generalized) (M62.81)                Time: 8338-2505 OT Time Calculation (min): 40 min Charges:  OT General Charges $OT Visit: 1 Visit OT Evaluation $OT Eval Moderate Complexity: 1 Mod OT Treatments $Self Care/Home Management : 23-37 mins  Ardeth Perfect., MPH, MS, OTR/L ascom [phone] 12/03/21, 1:35 PM

## 2021-12-03 NOTE — Sepsis Progress Note (Signed)
Elink following code sepsis °

## 2021-12-04 DIAGNOSIS — A409 Streptococcal sepsis, unspecified: Principal | ICD-10-CM

## 2021-12-04 DIAGNOSIS — B9689 Other specified bacterial agents as the cause of diseases classified elsewhere: Secondary | ICD-10-CM

## 2021-12-04 LAB — CBC WITH DIFFERENTIAL/PLATELET
Abs Immature Granulocytes: 0.04 10*3/uL (ref 0.00–0.07)
Basophils Absolute: 0 10*3/uL (ref 0.0–0.1)
Basophils Relative: 0 %
Eosinophils Absolute: 0.3 10*3/uL (ref 0.0–0.5)
Eosinophils Relative: 3 %
HCT: 41 % (ref 36.0–46.0)
Hemoglobin: 12.7 g/dL (ref 12.0–15.0)
Immature Granulocytes: 0 %
Lymphocytes Relative: 25 %
Lymphs Abs: 2.4 10*3/uL (ref 0.7–4.0)
MCH: 30.6 pg (ref 26.0–34.0)
MCHC: 31 g/dL (ref 30.0–36.0)
MCV: 98.8 fL (ref 80.0–100.0)
Monocytes Absolute: 0.5 10*3/uL (ref 0.1–1.0)
Monocytes Relative: 5 %
Neutro Abs: 6.2 10*3/uL (ref 1.7–7.7)
Neutrophils Relative %: 67 %
Platelets: 164 10*3/uL (ref 150–400)
RBC: 4.15 MIL/uL (ref 3.87–5.11)
RDW: 14.1 % (ref 11.5–15.5)
WBC: 9.4 10*3/uL (ref 4.0–10.5)
nRBC: 0 % (ref 0.0–0.2)

## 2021-12-04 LAB — COMPREHENSIVE METABOLIC PANEL
ALT: 11 U/L (ref 0–44)
AST: 17 U/L (ref 15–41)
Albumin: 3.2 g/dL — ABNORMAL LOW (ref 3.5–5.0)
Alkaline Phosphatase: 58 U/L (ref 38–126)
Anion gap: 8 (ref 5–15)
BUN: 18 mg/dL (ref 8–23)
CO2: 30 mmol/L (ref 22–32)
Calcium: 8 mg/dL — ABNORMAL LOW (ref 8.9–10.3)
Chloride: 100 mmol/L (ref 98–111)
Creatinine, Ser: 1.01 mg/dL — ABNORMAL HIGH (ref 0.44–1.00)
GFR, Estimated: 51 mL/min — ABNORMAL LOW (ref >60)
Glucose, Bld: 121 mg/dL — ABNORMAL HIGH (ref 70–99)
Potassium: 3.8 mmol/L (ref 3.5–5.1)
Sodium: 138 mmol/L (ref 135–145)
Total Bilirubin: 0.5 mg/dL (ref 0.3–1.2)
Total Protein: 6.6 g/dL (ref 6.5–8.1)

## 2021-12-04 LAB — GLUCOSE, CAPILLARY
Glucose-Capillary: 121 mg/dL — ABNORMAL HIGH (ref 70–99)
Glucose-Capillary: 152 mg/dL — ABNORMAL HIGH (ref 70–99)
Glucose-Capillary: 160 mg/dL — ABNORMAL HIGH (ref 70–99)
Glucose-Capillary: 190 mg/dL — ABNORMAL HIGH (ref 70–99)

## 2021-12-04 LAB — LEGIONELLA PNEUMOPHILA SEROGP 1 UR AG: L. pneumophila Serogp 1 Ur Ag: NEGATIVE

## 2021-12-04 LAB — MAGNESIUM: Magnesium: 1.9 mg/dL (ref 1.7–2.4)

## 2021-12-04 LAB — PHOSPHORUS: Phosphorus: 2.6 mg/dL (ref 2.5–4.6)

## 2021-12-04 LAB — HEMOGLOBIN A1C
Hgb A1c MFr Bld: 7.6 % — ABNORMAL HIGH (ref 4.8–5.6)
Mean Plasma Glucose: 171 mg/dL

## 2021-12-04 MED ORDER — METHYLPREDNISOLONE SODIUM SUCC 125 MG IJ SOLR
60.0000 mg | INTRAMUSCULAR | Status: DC
  Administered 2021-12-04 – 2021-12-06 (×3): 60 mg via INTRAVENOUS
  Filled 2021-12-04 (×3): qty 2

## 2021-12-04 MED ORDER — SODIUM CHLORIDE 0.9 % IV SOLN: INTRAVENOUS | Status: DC

## 2021-12-04 MED ORDER — LEVALBUTEROL HCL 0.63 MG/3ML IN NEBU
0.6300 mg | INHALATION_SOLUTION | Freq: Four times a day (QID) | RESPIRATORY_TRACT | Status: DC
  Administered 2021-12-04 – 2021-12-06 (×5): 0.63 mg via RESPIRATORY_TRACT
  Filled 2021-12-04 (×9): qty 3

## 2021-12-04 MED ORDER — IPRATROPIUM BROMIDE 0.02 % IN SOLN
0.5000 mg | Freq: Four times a day (QID) | RESPIRATORY_TRACT | Status: DC
  Administered 2021-12-04 – 2021-12-06 (×5): 0.5 mg via RESPIRATORY_TRACT
  Filled 2021-12-04 (×5): qty 2.5

## 2021-12-04 MED ORDER — ALBUTEROL SULFATE (2.5 MG/3ML) 0.083% IN NEBU: 2.5000 mg | INHALATION_SOLUTION | Freq: Four times a day (QID) | RESPIRATORY_TRACT | Status: DC | PRN

## 2021-12-04 NOTE — Progress Notes (Signed)
Patient alert to self only this morning. Patient displaying paranoid behaviors and frequently commenting on the "monkeys" she sees in her room. Patient does seem fearful of the monkeys. Patient unable to be reoriented. MD made aware

## 2021-12-04 NOTE — TOC Progression Note (Signed)
Transition of Care [facility]) - Progression Note    Patient Details  Name: Crystal Delacruz MRN: [id number] Date of Birth: September 05, 1924  Transition of Care [facility]) CM/SW [place], RN Phone Number: 12/04/2021, 11:07 AM  Clinical Narrative:  Spoke to patient, spouse and son about SNF recommendation.  They would like Home Health.  Care team made aware of patient's decision.  RNCM reaching out to home health agencies to see if they will accept patient insurance.   Expected Discharge Plan:  (TBD) Barriers to Discharge: Continued Medical Work up  Expected Discharge Plan and Services Expected Discharge Plan:  (TBD)   Discharge Planning Services: CM Consult Post Acute Care Choice:  (would like [place]) Living arrangements for the past 2 months: Single Family Home                                       Social Determinants of Health (SDOH) Interventions    Readmission Risk Interventions No flowsheet data found.

## 2021-12-04 NOTE — Progress Notes (Addendum)
Physical Therapy Treatment Patient Details Name: Crystal Delacruz MRN: [id number] DOB: 1924-07-25 Today's Date: 12/04/2021   History of Present Illness 88 y.o. female with medical history significant for asthma, hypertension, hypothyroidism, dementia, stage IIIa chronic kidney disease, dyslipidemia, osteoarthritis and type 2 diabetes mellitus, who presented to emergency room with acute onset of generalized weakness and altered mental status.    PT Comments    Confused but able to direct care as needed.  To EOB with min a x 1.  Steady in sitting.  She is able to stand from bed with heavy cues and 2 tries and sucessfully transfer to [facility] per her request but did not void.  She transfers back then after seated rest is able to stand and take a few sidesteps to reposition in bed.  Fatigued with activity but overall does well.  Discussed discharge plan with husband.  They have all equipment at home and he feels she is at her baseline.  Has a lift chair, walker, commode and wheelchair at home.  He does not feel SNF would be beneficial.  Son assists with care and they are working on lining up more in home help for mobility.  Given PLOF and family wishes, will upgrade discharge plan to HHPT to assist with DC planning.     Recommendations for follow up therapy are one component of a multi-disciplinary discharge planning process, led by the attending physician.  Recommendations may be updated based on patient status, additional functional criteria and insurance authorization.  Follow Up Recommendations  Home health PT     Assistance Recommended at Discharge Frequent or constant Supervision/Assistance  Patient can return home with the following A little help with walking and/or transfers;A little help with bathing/dressing/bathroom;Assistance with cooking/housework;Help with stairs or ramp for entrance;Assist for transportation;Direct supervision/assist for financial management   Equipment Recommendations  None  recommended by PT    Recommendations for Other Services       Precautions / Restrictions Precautions Precautions: Fall Restrictions Weight Bearing Restrictions: No     Mobility  Bed Mobility Overal bed mobility: Needs Assistance Bed Mobility: Supine to Sit, Sit to Supine     Supine to sit: Min assist Sit to supine: Mod assist   General bed mobility comments: cues for bed rail use, sequencing    Transfers Overall transfer level: Needs assistance Equipment used: Rolling walker (2 wheels)   Sit to Stand: Min assist, Mod assist           General transfer comment: extensive cues and encouragement    Ambulation/Gait Ambulation/Gait assistance: Min guard Gait Distance (Feet): 4 Feet Assistive device: Rolling walker (2 wheels) Gait Pattern/deviations: Step-to pattern Gait velocity: decreased         Stairs             Wheelchair Mobility    Modified Rankin (Stroke Patients Only)       Balance Overall balance assessment: Needs assistance, History of Falls Sitting-balance support: Bilateral upper extremity supported, Single extremity supported, Feet supported Sitting balance-Leahy Scale: Fair     Standing balance support: Bilateral upper extremity supported, Reliant on assistive device for balance Standing balance-Leahy Scale: Poor Standing balance comment: pt requires AD for side steps and upright posture.                            Cognition Arousal/Alertness: Awake/alert Behavior During Therapy: WFL for tasks assessed/performed Overall Cognitive Status: History of cognitive impairments - at baseline  General Comments: husband stated she is at baseline cognition        Exercises Other Exercises Other Exercises: to commode but did not void    General Comments        Pertinent Vitals/Pain Pain Assessment Pain Assessment: No/denies pain    Home Living                           Prior Function            PT Goals (current goals can now be found in the care plan section) Progress towards PT goals: Progressing toward goals    Frequency    Min 2X/week      PT Plan Discharge plan needs to be updated    Co-evaluation              AM-PAC PT "6 Clicks" Mobility   Outcome Measure  Help needed turning from your back to your side while in a flat bed without using bedrails?: A Little Help needed moving from lying on your back to sitting on the side of a flat bed without using bedrails?: A Little Help needed moving to and from a bed to a chair (including a wheelchair)?: A Little Help needed standing up from a chair using your arms (e.g., wheelchair or bedside chair)?: A Lot Help needed to walk in hospital room?: A Little Help needed climbing 3-5 steps with a railing? : Total 6 Click Score: 15    End of Session Equipment Utilized During Treatment: Gait belt Activity Tolerance: Patient tolerated treatment well Patient left: in bed;with call bell/phone within reach;with bed alarm set;with family/visitor present Nurse Communication: Mobility status PT Visit Diagnosis: Unsteadiness on feet (R26.81);Other abnormalities of gait and mobility (R26.89);Muscle weakness (generalized) (M62.81);History of falling (Z91.81);Difficulty in walking, not elsewhere classified (R26.2)     Time: 0139-0209 PT Time Calculation (min) (ACUTE ONLY): 30 min  Charges:  $Gait Training: 8-22 mins $Therapeutic Activity: 8-22 mins                    Chesley Noon, PTA 12/04/21, 2:53 PM

## 2021-12-04 NOTE — Progress Notes (Signed)
PROGRESS NOTE    Crystal Delacruz  ZOX:[id number] DOB: 08-Jan-1924 DOA: 12/02/2021 PCP: Kirk Ruths, MD   Brief Narrative:  The patient is a 88 year old elderly Caucasian female with a past medical history significant for but not limited too asthma, hypertension, hypothyroidism, history of dementia, stage III chronic kidney disease, history of dyslipidemia, arthritis, type II days mellitus as well as other comorbidities who presented to the ED with acute generalized eyes onset of weakness and altered mental status and confusion.  She has not been eating or drinking much lately and she appeared to be short of breath with associated cough and chest swelling with inability to expectorate.  She had a T-max of 101 at home with associated chills which she was given p.o. Tylenol.  Has not been having much urine output with diminished p.o. intake and admits to having significant dysuria and burning on her urine.  Because of the symptoms she presented to the ED and respiratory rate was noted to be 26 and a pulse oximetry was 88%.  Is placed on 2 L of supplemental oxygen via nasal cannula and her O2 saturation improved 94%.  Temperature is normal at at 99.3.  Labs revealed a leukocytosis of 13 with neutrophilia and a BMP showed an elevated BUN/creatinine from her prior baseline.  Imaging done and showed a chest x-ray which showed multifocal patchy opacities in the left upper lobe predominant favoring multifocal pneumonia.  She is given IV ceftriaxone and azithromycin as well as 2 half liters of IV lactated ringer boluses and then placed on 150 MLS per hour.  She is admitted to the medical telemetry bed for the further evaluation management of sepsis secondary to multifocal pneumonia as well as concomitant UTI   Assessment & Plan:   Principal Problem:   Multifocal pneumonia  Sepsis secondary to multifocal pneumonia with concomitant UTI manifested by leukocytosis and tachypnea -She was admitted to the medical  telemetry -Initiated on antibiotics with IV ceftriaxone and azithromycin which will be continued; because strep pneumo was positive we will discontinue azithromycin and continue ceftriaxone -She received 2-1/2 lactated Ringer boluses and now started on maintenance IV fluid hydration which we have now stopped -She is given mucolytic therapy and bronchodilators with Xopenex and Atrovent and continue with albuterol nebs -Obtain sputum culture and follow blood cultures -WBC went from 13.0 -> 12.6 and is now 9 point -LA went from 3.6 -> 1.7 -Given that she has some wheezing we will start her on 60 mg of Solu-Medrol q24h -Obtain urine pneumonia antigen and strep pneumo was positive -Will hold and discontinue any further fluid IV hydration -Continue to follow cultures and repeat WBC and trend fever curve -Repeat chest x-ray in a.m.   Acute hypoxic respiratory failure in the setting of multifocal pneumonia -SpO2: 90 % O2 Flow Rate (L/min): 2 L/min -Continuous pulse oximetry maintain O2 saturation greater 90% -Continue supplemental oxygen via nasal cannula wean O2 as tolerated -We will add nebs with Xopenex and Atrovent every 6 hours as well as albuterol every 6 as needed -We will add flutter valve, incentive spirometry as well as guaifenesin 600 mg po BID -She will need an ambulatory home O2 screen prior to discharge -Repeat chest x-ray in a.m. and may consider chest CT scan if not improving   Acute UTI  -Urinalysis showed cloudy appearance with yellow color urine, trace hemoglobin, large ketones, negative nitrites, trace protein, many bacteria, present mucus, present hyaline cast, 0-5 RBCs per high-power field, 21-50 WBCs -Urine cultures  showing >100,000 CFU of Klebsiellae Pneumoniae  -Continue with empiric IV ceftriaxone as above and follow blood cultures -We will add Pyridium given her dysuria and burning   AKI on CKD stage IIIa -She is given IV fluid hydration with normal and currently  getting maintenance rate at 100 MLS per hour -Patient's BUNs/creatinine went from 25/1.3 is now improved to 22/1.04 -Avoid further nephrotoxic medications, contrast dyes, hypotension and renally adjust medications -Repeat CMP in the a.m.   Diabetes mellitus type 2 with hypoglycemia and associated peripheral neuropathy -We will hold her home glimepiride and continue sensitive NovoLog sliding scale insulin AC and basal coverage with Semglee 15 units -Continue Neurontin -Checked HbA1c was 7.6 -CBGs ranging form 112-144   Hypothyroidism -Check TSH in the a.m. -Continue levothyroxine 50 mcg daily   Essential Hypertension -Continue her Toprol-XL -Continue monitor blood pressures per protocol -Last blood pressure reading was 150/80   Thrombocytopenia -Patient's Platelet Count went from 214 -> 145 -> 164 -Likely in the setting of above -Continue monitor for signs and symptoms of bleeding -Repeat CBC in a.m.  Overweight -Complicates overall prognosis and care -Estimated body mass index is 27.88 kg/m as calculated from the following:   Height as of this encounter: 5\' 7"  (1.702 m).   Weight as of 10/18/18: 80.7 kg.  -Weight Loss and Dietary Counseling given   DVT prophylaxis: Enoxaparin 30 mg sq q24h Code Status: FULL CODE Family Communication: Discussed with family present at bedside Disposition Plan: Pending further clinical improvement and culture results.  Patient was eval by PT OT who recommended SNF but patient's family does not want her to go to SNF and have elected for home health services  Status is: Inpatient  Remains inpatient appropriate because: Continues to wear oxygen and will need an amatory home O2 screen prior to discharge as well as evaluation of her UTI and pneumonia.  Consultants:  None  Procedures:  None  Antimicrobials:  Anti-infectives (From admission, onward)    Start     Dose/Rate Route Frequency Ordered Stop   12/03/21 0030  cefTRIAXone (ROCEPHIN)  2 g in sodium chloride 0.9 % 100 mL IVPB  Status:  Discontinued        2 g 200 mL/hr over 30 Minutes Intravenous Every 24 hours 12/03/21 0022 12/03/21 0034   12/03/21 0030  azithromycin (ZITHROMAX) 500 mg in sodium chloride 0.9 % 250 mL IVPB  Status:  Discontinued        500 mg 250 mL/hr over 60 Minutes Intravenous Every 24 hours 12/03/21 0022 12/03/21 0035   12/02/21 2345  cefTRIAXone (ROCEPHIN) 2 g in sodium chloride 0.9 % 100 mL IVPB        2 g 200 mL/hr over 30 Minutes Intravenous Every 24 hours 12/02/21 2344 12/07/21 2344   12/02/21 2345  azithromycin (ZITHROMAX) 500 mg in sodium chloride 0.9 % 250 mL IVPB  Status:  Discontinued        500 mg 250 mL/hr over 60 Minutes Intravenous Every 24 hours 12/02/21 2344 12/03/21 1345        Subjective: Seen and examined at bedside this morning was doing okay.  Nursing states that she was extremely confused and seeing monkeys and hallucinating but when I went to see her she was appropriate.  Son is at bedside.  She denies any chest pain and still complains of some burning in her urine.  Shortness of breath is improving.  No lightheadedness or dizziness.  No other concerns or complaints at this time.  Objective: Vitals:   12/04/21 0824 12/04/21 1153 12/04/21 1234 12/04/21 1550  BP: (!) 152/68 (!) 170/77 131/83 (!) 150/80  Pulse: 73 70 77 70  Resp: 20 14  18   Temp: 98 F (36.7 C) 98.7 F (37.1 C)  98.2 F (36.8 C)  TempSrc: Oral Oral  Oral  SpO2: 91% 94% 99% 90%  Height:        Intake/Output Summary (Last 24 hours) at 12/04/2021 1608 Last data filed at 12/04/2021 1414 Gross per 24 hour  Intake 240 ml  Output 450 ml  Net -210 ml   There were no vitals filed for this visit.  Examination: Physical Exam:  Constitutional: WN/WD elderly overweight Caucasian female currently in no acute distress appears calm sitting up in the bed Eyes: Lids and conjunctivae normal, sclerae anicteric  ENMT: External Ears, Nose appear normal. Grossly  normal hearing. Mucous membranes are moist.  Neck: Appears normal, supple, no cervical masses, normal ROM, no appreciable thyromegaly; no appreciable JVD Respiratory: Diminished to auscultation bilaterally with coarse breath sounds and some rhonchi worse on the left compared to right and slight expiratory wheezing.  No appreciable crackles noted. Normal respiratory effort and patient is not tachypenic. No accessory muscle use.  Wearing supplemental oxygen via nasal cannula Cardiovascular: RRR, no murmurs / rubs / gallops. S1 and S2 auscultated.  Mild 1+ lower extremity edema Abdomen: Soft, non-tender, distended secondary body habitus. Bowel sounds positive.  GU: Deferred. Musculoskeletal: No clubbing / cyanosis of digits/nails. No joint deformity upper and lower extremities.  Skin: No rashes, lesions, ulcers on limited skin evaluation. No induration; Warm and dry.  Neurologic: CN 2-12 grossly intact with no focal deficits. Romberg sign cerebellar reflexes not assessed.  Psychiatric: Normal judgment and insight. Alert and awake and oriented x 2. Normal mood and appropriate affect.   Data Reviewed: I have personally reviewed following labs and imaging studies  CBC: Recent Labs  Lab 12/02/21 2147 12/03/21 0649 12/04/21 0902  WBC 13.0* 12.6* 9.4  NEUTROABS 10.3*  --  6.2  HGB 12.8 12.0 12.7  HCT 42.0 38.8 41.0  MCV 99.5 98.7 98.8  PLT 214 145* 419   Basic Metabolic Panel: Recent Labs  Lab 12/02/21 2147 12/03/21 0649 12/04/21 0902  NA 137 142 138  K 4.4 3.9 3.8  CL 95* 102 100  CO2 31 32 30  GLUCOSE 269* 65* 121*  BUN 25* 22 18  CREATININE 1.38* 1.04* 1.01*  CALCIUM 8.7* 8.2* 8.0*  MG  --   --  1.9  PHOS  --   --  2.6   GFR: CrCl cannot be calculated (Unknown ideal weight.). Liver Function Tests: Recent Labs  Lab 12/04/21 0902  AST 17  ALT 11  ALKPHOS 58  BILITOT 0.5  PROT 6.6  ALBUMIN 3.2*   No results for input(s): LIPASE, AMYLASE in the last 168 hours. No  results for input(s): AMMONIA in the last 168 hours. Coagulation Profile: No results for input(s): INR, PROTIME in the last 168 hours. Cardiac Enzymes: No results for input(s): CKTOTAL, CKMB, CKMBINDEX, TROPONINI in the last 168 hours. BNP (last 3 results) No results for input(s): PROBNP in the last 8760 hours. HbA1C: Recent Labs    12/03/21 0649  HGBA1C 7.6*   CBG: Recent Labs  Lab 12/03/21 1154 12/03/21 1632 12/03/21 2024 12/04/21 0730 12/04/21 1149  GLUCAP 109* 144* 218* 121* 152*   Lipid Profile: No results for input(s): CHOL, HDL, LDLCALC, TRIG, CHOLHDL, LDLDIRECT in the last 72 hours. Thyroid Function  Tests: No results for input(s): TSH, T4TOTAL, FREET4, T3FREE, THYROIDAB in the last 72 hours. Anemia Panel: No results for input(s): VITAMINB12, FOLATE, FERRITIN, TIBC, IRON, RETICCTPCT in the last 72 hours. Sepsis Labs: Recent Labs  Lab 12/02/21 2345 12/03/21 0151  LATICACIDVEN 3.6* 1.7    Recent Results (from the past 240 hour(s))  Blood culture (routine x 2)     Status: None (Preliminary result)   Collection Time: 12/02/21  9:47 PM   Specimen: BLOOD  Result Value Ref Range Status   Specimen Description BLOOD LEFT ANTECUBITAL  Final   Special Requests   Final    BOTTLES DRAWN AEROBIC AND ANAEROBIC Blood Culture results may not be optimal due to an excessive volume of blood received in culture bottles   Culture   Final    NO GROWTH 2 DAYS Performed at [facility], [address] [place], [place] [address]    Report Status PENDING  Incomplete  Resp Panel by RT-PCR (Flu A&B, Covid) Nasopharyngeal Swab     Status: None   Collection Time: 12/02/21  9:47 PM   Specimen: Nasopharyngeal Swab; Nasopharyngeal(NP) swabs in vial transport medium  Result Value Ref Range Status   SARS Coronavirus 2 by RT PCR NEGATIVE NEGATIVE Final    Comment: (NOTE) SARS-CoV-2 target nucleic acids are NOT DETECTED.  The SARS-CoV-2 RNA is generally detectable in upper  respiratory specimens during the acute phase of infection. The lowest concentration of SARS-CoV-2 viral copies this assay can detect is 138 copies/mL. A negative result does not preclude SARS-Cov-2 infection and should not be used as the sole basis for treatment or other patient management decisions. A negative result may occur with  improper specimen collection/handling, submission of specimen other than nasopharyngeal swab, presence of viral mutation(s) within the areas targeted by this assay, and inadequate number of viral copies(<138 copies/mL). A negative result must be combined with clinical observations, patient history, and epidemiological information. The expected result is Negative.  Fact Sheet for Patients:  EntrepreneurPulse.com.au  Fact Sheet for Healthcare Providers:  IncredibleEmployment.be  This test is no t yet approved or cleared by the [place] FDA and  has been authorized for detection and/or diagnosis of SARS-CoV-2 by FDA under an Emergency Use Authorization (EUA). This EUA will remain  in effect (meaning this test can be used) for the duration of the COVID-19 declaration under Section 564(b)(1) of the Act, 21 U.S.C.section 360bbb-3(b)(1), unless the authorization is terminated  or revoked sooner.       Influenza A by PCR NEGATIVE NEGATIVE Final   Influenza B by PCR NEGATIVE NEGATIVE Final    Comment: (NOTE) The Xpert Xpress SARS-CoV-2/FLU/RSV plus assay is intended as an aid in the diagnosis of influenza from Nasopharyngeal swab specimens and should not be used as a sole basis for treatment. Nasal washings and aspirates are unacceptable for Xpert Xpress SARS-CoV-2/FLU/RSV testing.  Fact Sheet for Patients: EntrepreneurPulse.com.au  Fact Sheet for Healthcare Providers: IncredibleEmployment.be  This test is not yet approved or cleared by the [place] FDA and has been  authorized for detection and/or diagnosis of SARS-CoV-2 by FDA under an Emergency Use Authorization (EUA). This EUA will remain in effect (meaning this test can be used) for the duration of the COVID-19 declaration under Section 564(b)(1) of the Act, 21 U.S.C. section 360bbb-3(b)(1), unless the authorization is terminated or revoked.  Performed at [facility], [address] [place], [place] [address]   Urine Culture     Status: Abnormal (Preliminary result)  Collection Time: 12/03/21 12:14 AM   Specimen: Urine, Random  Result Value Ref Range Status   Specimen Description   Final    URINE, RANDOM Performed at [facility], [address] [place], [place] [address]    Special Requests   Final    NONE Performed at [facility], [address] [place], [place] [address]    Culture >=100,000 COLONIES/mL KLEBSIELLA PNEUMONIAE (A)  Final   Report Status PENDING  Incomplete  Blood culture (routine x 2)     Status: None (Preliminary result)   Collection Time: 12/03/21  6:40 AM   Specimen: BLOOD  Result Value Ref Range Status   Specimen Description BLOOD RIGHT ANTECUBITAL  Final   Special Requests   Final    BOTTLES DRAWN AEROBIC AND ANAEROBIC Blood Culture adequate volume   Culture   Final    NO GROWTH < 24 HOURS Performed at [facility], [address] [place], [place] [address]    Report Status PENDING  Incomplete    RN Pressure Injury Documentation:     Estimated body mass index is 27.88 kg/m as calculated from the following:   Height as of this encounter: 5\' 7"  (1.702 m).   Weight as of 10/18/18: 80.7 kg.  Malnutrition Type:   Malnutrition Characteristics:   Nutrition Interventions:   Radiology Studies: DG Chest 2 View  Result Date: 12/02/2021 CLINICAL DATA:  Shortness of breath EXAM: CHEST - 2 VIEW COMPARISON:  10/21/2018 FINDINGS: Multifocal patchy opacities, left upper lobe predominant, favoring multifocal pneumonia.  No definite pleural effusions. No pneumothorax. The heart is normal in size. Degenerative changes of the visualized thoracolumbar spine. Lumbar spine fixation hardware. Cholecystectomy clips. IMPRESSION: Multifocal patchy opacities, left upper lobe predominant, favoring multifocal pneumonia. Electronically Signed   By: Julian Hy M.D.   On: 12/02/2021 22:19    Scheduled Meds:  aspirin EC  81 mg Oral Daily   enoxaparin (LOVENOX) injection  30 mg Subcutaneous Q24H   gabapentin  200 mg Oral TID   guaiFENesin  600 mg Oral BID   insulin aspart  0-5 Units Subcutaneous QHS   insulin aspart  0-9 Units Subcutaneous TID WC   insulin glargine-yfgn  15 Units Subcutaneous Daily   ipratropium  0.5 mg Nebulization Q6H   levalbuterol  0.63 mg Nebulization Q6H   levothyroxine  50 mcg Oral Q0600   metoprolol succinate  50 mg Oral BID   phenazopyridine  100 mg Oral TID WC   torsemide  40 mg Oral Daily   Continuous Infusions:  cefTRIAXone (ROCEPHIN)  IV 2 g (12/03/21 2250)   lactated ringers      LOS: 1 day   Kerney Elbe, DO [facility] PAGER is on AMION  If 7PM-7AM, please contact night-coverage [email or web address]

## 2021-12-05 ENCOUNTER — Inpatient Hospital Stay: Payer: PPO

## 2021-12-05 ENCOUNTER — Encounter: Payer: Self-pay | Admitting: Family Medicine

## 2021-12-05 LAB — URINE CULTURE: Culture: >=100000 — AB

## 2021-12-05 LAB — COMPREHENSIVE METABOLIC PANEL
ALT: 12 U/L (ref 0–44)
AST: 17 U/L (ref 15–41)
Albumin: 2.8 g/dL — ABNORMAL LOW (ref 3.5–5.0)
Alkaline Phosphatase: 51 U/L (ref 38–126)
Anion gap: 11 (ref 5–15)
BUN: 17 mg/dL (ref 8–23)
CO2: 25 mmol/L (ref 22–32)
Calcium: 8.2 mg/dL — ABNORMAL LOW (ref 8.9–10.3)
Chloride: 101 mmol/L (ref 98–111)
Creatinine, Ser: 0.79 mg/dL (ref 0.44–1.00)
GFR, Estimated: >60 mL/min (ref >60)
Glucose, Bld: 208 mg/dL — ABNORMAL HIGH (ref 70–99)
Potassium: 3.4 mmol/L — ABNORMAL LOW (ref 3.5–5.1)
Sodium: 137 mmol/L (ref 135–145)
Total Bilirubin: 0.5 mg/dL (ref 0.3–1.2)
Total Protein: 5.8 g/dL — ABNORMAL LOW (ref 6.5–8.1)

## 2021-12-05 LAB — CBC WITH DIFFERENTIAL/PLATELET
Abs Immature Granulocytes: 0.04 10*3/uL (ref 0.00–0.07)
Basophils Absolute: 0 10*3/uL (ref 0.0–0.1)
Basophils Relative: 0 %
Eosinophils Absolute: 0 10*3/uL (ref 0.0–0.5)
Eosinophils Relative: 0 %
HCT: 37.5 % (ref 36.0–46.0)
Hemoglobin: 11.6 g/dL — ABNORMAL LOW (ref 12.0–15.0)
Immature Granulocytes: 1 %
Lymphocytes Relative: 12 %
Lymphs Abs: 1 10*3/uL (ref 0.7–4.0)
MCH: 30.2 pg (ref 26.0–34.0)
MCHC: 30.9 g/dL (ref 30.0–36.0)
MCV: 97.7 fL (ref 80.0–100.0)
Monocytes Absolute: 0.1 10*3/uL (ref 0.1–1.0)
Monocytes Relative: 1 %
Neutro Abs: 7.7 10*3/uL (ref 1.7–7.7)
Neutrophils Relative %: 86 %
Platelets: 156 10*3/uL (ref 150–400)
RBC: 3.84 MIL/uL — ABNORMAL LOW (ref 3.87–5.11)
RDW: 13.9 % (ref 11.5–15.5)
WBC: 8.8 10*3/uL (ref 4.0–10.5)
nRBC: 0 % (ref 0.0–0.2)

## 2021-12-05 LAB — MAGNESIUM: Magnesium: 1.9 mg/dL (ref 1.7–2.4)

## 2021-12-05 LAB — GLUCOSE, CAPILLARY
Glucose-Capillary: 194 mg/dL — ABNORMAL HIGH (ref 70–99)
Glucose-Capillary: 165 mg/dL — ABNORMAL HIGH (ref 70–99)
Glucose-Capillary: 149 mg/dL — ABNORMAL HIGH (ref 70–99)
Glucose-Capillary: 186 mg/dL — ABNORMAL HIGH (ref 70–99)

## 2021-12-05 LAB — PHOSPHORUS: Phosphorus: 3 mg/dL (ref 2.5–4.6)

## 2021-12-05 MED ORDER — POTASSIUM CHLORIDE CRYS ER 20 MEQ PO TBCR
40.0000 meq | EXTENDED_RELEASE_TABLET | Freq: Once | ORAL | Status: AC
  Administered 2021-12-05: 40 meq via ORAL
  Filled 2021-12-05: qty 2

## 2021-12-05 MED ORDER — IOHEXOL 300 MG/ML  SOLN
75.0000 mL | Freq: Once | INTRAMUSCULAR | Status: AC | PRN
  Administered 2021-12-05: 16:00:00 75 mL via INTRAVENOUS

## 2021-12-05 MED ORDER — POTASSIUM CHLORIDE 20 MEQ PO PACK
20.0000 meq | PACK | Freq: Once | ORAL | Status: AC
  Administered 2021-12-05: 16:00:00 20 meq via ORAL
  Filled 2021-12-05: qty 1

## 2021-12-05 NOTE — TOC Progression Note (Addendum)
Transition of Care [facility]) - Progression Note    Patient Details  Name: Crystal Delacruz MRN: [id number] Date of Birth: 04-09-1924  [facility]) CM/SW [place], RN Phone Number: 12/05/2021, 10:49 AM  Clinical Narrative:   Therapy recommendation changed to Home Health.  [facility] and [facility] have all refused to take patient due to not in network.  Left message for [employer] yesterday, was able to get through today.  [facility] is working on home health for patient.  TOC to follow.  Addendum 1417:  [facility] and [facility] state they do not have availability for home health for this patient at this time.  Addendum 1550: [facility] is also unable to take patient at this time.  Expected Discharge Plan:  (TBD) Barriers to Discharge: Continued Medical Work up  Expected Discharge Plan and Services Expected Discharge Plan:  (TBD)   Discharge Planning Services: CM Consult Post Acute Care Choice:  (would like [place]) Living arrangements for the past 2 months: Single Family Home                                       Social Determinants of Health (SDOH) Interventions    Readmission Risk Interventions No flowsheet data found.

## 2021-12-05 NOTE — Progress Notes (Signed)
PROGRESS NOTE    Crystal Delacruz  UXN:[id number] DOB: [birth year] DOA: 12/02/2021 PCP: Kirk Ruths, MD   Brief Narrative:  The patient is a 88 year old elderly Caucasian female with a past medical history significant for but not limited too asthma, hypertension, hypothyroidism, history of dementia, stage III chronic kidney disease, history of dyslipidemia, arthritis, type II days mellitus as well as other comorbidities who presented to the ED with acute generalized eyes onset of weakness and altered mental status and confusion.  She has not been eating or drinking much lately and she appeared to be short of breath with associated cough and chest swelling with inability to expectorate.  She had a T-max of 101 at home with associated chills which she was given p.o. Tylenol.  Has not been having much urine output with diminished p.o. intake and admits to having significant dysuria and burning on her urine.  Because of the symptoms she presented to the ED and respiratory rate was noted to be 26 and a pulse oximetry was 88%.  Is placed on 2 L of supplemental oxygen via nasal cannula and her O2 saturation improved 94%.  Temperature is normal at at 99.3.  Labs revealed a leukocytosis of 13 with neutrophilia and a BMP showed an elevated BUN/creatinine from her prior baseline.  Imaging done and showed a chest x-ray which showed multifocal patchy opacities in the left upper lobe predominant favoring multifocal pneumonia.  She is given IV ceftriaxone and azithromycin as well as 2 half liters of IV lactated ringer boluses and then placed on 150 MLS per hour.  IV fluid hydration has now stopped.  She was admitted to the medical telemetry bed for the further evaluation management of sepsis secondary to multifocal pneumonia as well as concomitant UTI  Assessment & Plan:   Principal Problem:   Multifocal pneumonia  Sepsis secondary to multifocal pneumonia with concomitant UTI manifested by leukocytosis and  tachypnea -She was admitted to the medical telemetry -Initiated on antibiotics with IV ceftriaxone and azithromycin which will be continued; because strep pneumo was positive we will discontinue azithromycin and continue ceftriaxone -She received 2-1/2 lactated Ringer boluses and now started on maintenance IV fluid hydration which we have now stopped -She is given mucolytic therapy and bronchodilators with Xopenex and Atrovent and continue with albuterol nebs -Obtain sputum culture and follow blood cultures -WBC went from 13.0 -> 12.6 and is now 9.4 -> 8.8 -LA went from 3.6 -> 1.7 -Given that she had some wheezing we will start her on 60 mg of Solu-Medrol q24h and continuing wean and change to oral prednisone taper at discharge -Obtain urine pneumonia antigen and strep pneumo was positive -Will hold and discontinue any further fluid IV hydration -Check CT Chest with Contrast given worsening Infiltrate noted on CXR -Continue to follow cultures and repeat WBC and trend fever curve -Repeat chest x-ray in a.m.   Acute hypoxic respiratory failure in the setting of multifocal pneumonia -SpO2: 96 % O2 Flow Rate (L/min): 2 L/min -Continuous pulse oximetry maintain O2 saturation greater 90% -Continue supplemental oxygen via nasal cannula wean O2 as tolerated -We will add nebs with Xopenex and Atrovent every 6 hours as well as albuterol every 6 as needed -Continue the Solu-Medrol as above and start weaning -We will add flutter valve, incentive spirometry as well as guaifenesin 600 mg po BID -She will need an ambulatory home O2 screen prior to discharge -Repeat chest x-ray in a.m. and will get CT Chest with contrast given that  her CXR showed worsening Infiltrate  -Repeat CXR this AM showed "Cardiac shadow is enlarged but stable. Lungs are hypoinflated. Increasing airspace opacity is noted in the left lung. No sizable effusion is seen. No bony abnormality is noted."   Acute UTI  -Urinalysis showed  cloudy appearance with yellow color urine, trace hemoglobin, large ketones, negative nitrites, trace protein, many bacteria, present mucus, present hyaline cast, 0-5 RBCs per high-power field, 21-50 WBCs -Urine cultures showing >100,000 CFU of Klebsiellae Pneumoniae and resistant to ampicillin and intermediate to ampicillin sulbactam -Continue with empiric IV ceftriaxone as above and follow blood cultures -We will add Pyridium given her dysuria and burning but given her creatinine clearance we will hold off   AKI on CKD stage IIIa -She is given IV fluid hydration with normal and currently getting maintenance rate at 100 MLS per hour -Patient's BUNs/creatinine went from 25/1.3-> 22/1.04 -> 18/1.01 -> 17/0.79 -Avoid further nephrotoxic medications, contrast dyes, hypotension and renally adjust medications -Repeat CMP in the a.m.  Hypokalemia -Patient's K+ was 3.4 -Replete with po Kcl 40 mEQ x1 -Mag Level was 1.9 -Continue to Monitor and replete as necessary -Repeat CMP in the AM   Normocytic Anemia -Patient's Hgb/Hct went from 12.7/41.0 -> 11.6/37.5 -Check Anemia Panel in the AM -Continue to Monitor and Replete as Necessary  -Repeat CBC in the AM    Diabetes mellitus type 2 with hypoglycemia and associated peripheral neuropathy -We will hold her home glimepiride and continue sensitive NovoLog sliding scale insulin AC and basal coverage with Semglee 15 units -Continue Neurontin -Checked HbA1c was 7.6 -CBGs ranging form 121-195   Hypothyroidism -Check TSH in the a.m. -Continue Levothyroxine 50 mcg daily   Essential Hypertension -Continue her Toprol-XL -Continue monitor blood pressures per protocol -Last blood pressure reading was 126/64   Thrombocytopenia -Patient's Platelet Count went from 214 -> 145 -> 164 -> 156 -Likely in the setting of above -Continue monitor for signs and symptoms of bleeding -Repeat CBC in a.m.  Overweight -Complicates overall prognosis and  care -Estimated body mass index is 27.88 kg/m as calculated from the following:   Height as of this encounter: 5\' 7"  (1.702 m).   Weight as of 10/18/18: 80.7 kg.  -Weight Loss and Dietary Counseling given   DVT prophylaxis: Enoxaparin 30 mg sq q24h Code Status: FULL CODE Family Communication: Discussed with husband present at bedside Disposition Plan: Pending further clinical improvement and culture results.  Patient was eval by PT OT who recommended SNF but patient's family does not want her to go to SNF and have elected for home health services; anticipating discharging home in next 24 to 48 hours if improved  Status is: Inpatient  Remains inpatient appropriate because: Continues to wear oxygen and will need an amatory home O2 screen prior to discharge as well as evaluation of her UTI and pneumonia.  Consultants:  None  Procedures:  None  Antimicrobials:  Anti-infectives (From admission, onward)    Start     Dose/Rate Route Frequency Ordered Stop   12/03/21 0030  cefTRIAXone (ROCEPHIN) 2 g in sodium chloride 0.9 % 100 mL IVPB  Status:  Discontinued        2 g 200 mL/hr over 30 Minutes Intravenous Every 24 hours 12/03/21 0022 12/03/21 0034   12/03/21 0030  azithromycin (ZITHROMAX) 500 mg in sodium chloride 0.9 % 250 mL IVPB  Status:  Discontinued        500 mg 250 mL/hr over 60 Minutes Intravenous Every 24 hours 12/03/21 0022  12/03/21 0035   12/02/21 2345  cefTRIAXone (ROCEPHIN) 2 g in sodium chloride 0.9 % 100 mL IVPB        2 g 200 mL/hr over 30 Minutes Intravenous Every 24 hours 12/02/21 2344 12/07/21 2344   12/02/21 2345  azithromycin (ZITHROMAX) 500 mg in sodium chloride 0.9 % 250 mL IVPB  Status:  Discontinued        500 mg 250 mL/hr over 60 Minutes Intravenous Every 24 hours 12/02/21 2344 12/03/21 1345        Subjective: Seen and examined at bedside and   Objective: Vitals:   12/05/21 0544 12/05/21 0736 12/05/21 1012 12/05/21 1105  BP: (!) 143/82 111/78 (!)  141/74 129/69  Pulse: 75 (!) 51 (!) 101 84  Resp:  19  17  Temp:  99.3 F (37.4 C)  98.4 F (36.9 C)  TempSrc:  Oral  Oral  SpO2: 92% 92% 96% 96%  Height:        Intake/Output Summary (Last 24 hours) at 12/05/2021 1520 Last data filed at 12/05/2021 1300 Gross per 24 hour  Intake 560.06 ml  Output 1250 ml  Net -689.94 ml    There were no vitals filed for this visit.  Examination: Physical Exam:  Constitutional: WN/WD elderly overweight Caucasian female in NAD appears calm but is confused Eyes: Lids and conjunctivae normal, sclerae anicteric  ENMT: External Ears, Nose appear normal. Grossly normal hearing. Mucous membranes are moist.  Neck: Appears normal, supple, no cervical masses, normal ROM, no appreciable thyromegaly; no JVD Respiratory: Diminished to auscultation bilaterally with coarse breath sounds with some rhonchi which is worse on the left compared to the right. Normal respiratory effort and patient is not tachypenic. No accessory muscle use. Wearing Supplemental O2 via [place] Cardiovascular: RRR, no murmurs / rubs / gallops. S1 and S2 auscultated. Mild 1+ LE edema Abdomen: Soft, non-tender, non-distended. Bowel sounds positive.  GU: Deferred. Musculoskeletal: No clubbing / cyanosis of digits/nails. No joint deformity upper and lower extremities.  Skin: No rashes, lesions, ulcers on a limited skin evaluation. No induration; Warm and dry.  Neurologic: CN 2-12 grossly intact with no focal deficits. Romberg sign and cerebellar reflexes not assessed.  Psychiatric: Normal judgment and insight. Alert and oriented x 2. A little confused.   Data Reviewed: I have personally reviewed following labs and imaging studies  CBC: Recent Labs  Lab 12/02/21 2147 12/03/21 0649 12/04/21 0902 12/05/21 0532  WBC 13.0* 12.6* 9.4 8.8  NEUTROABS 10.3*  --  6.2 7.7  HGB 12.8 12.0 12.7 11.6*  HCT 42.0 38.8 41.0 37.5  MCV 99.5 98.7 98.8 97.7  PLT 214 145* 164 202    Basic Metabolic  Panel: Recent Labs  Lab 12/02/21 2147 12/03/21 0649 12/04/21 0902 12/05/21 0532  NA 137 142 138 137  K 4.4 3.9 3.8 3.4*  CL 95* 102 100 101  CO2 31 32 30 25  GLUCOSE 269* 65* 121* 208*  BUN 25* 22 18 17   CREATININE 1.38* 1.04* 1.01* 0.79  CALCIUM 8.7* 8.2* 8.0* 8.2*  MG  --   --  1.9 1.9  PHOS  --   --  2.6 3.0    GFR: CrCl cannot be calculated (Unknown ideal weight.). Liver Function Tests: Recent Labs  Lab 12/04/21 0902 12/05/21 0532  AST 17 17  ALT 11 12  ALKPHOS 58 51  BILITOT 0.5 0.5  PROT 6.6 5.8*  ALBUMIN 3.2* 2.8*    No results for input(s): LIPASE, AMYLASE in the last 168  hours. No results for input(s): AMMONIA in the last 168 hours. Coagulation Profile: No results for input(s): INR, PROTIME in the last 168 hours. Cardiac Enzymes: No results for input(s): CKTOTAL, CKMB, CKMBINDEX, TROPONINI in the last 168 hours. BNP (last 3 results) No results for input(s): PROBNP in the last 8760 hours. HbA1C: Recent Labs    12/03/21 0649  HGBA1C 7.6*    CBG: Recent Labs  Lab 12/04/21 1149 12/04/21 1617 12/04/21 2201 12/05/21 0737 12/05/21 1234  GLUCAP 152* 160* 190* 194* 165*    Lipid Profile: No results for input(s): CHOL, HDL, LDLCALC, TRIG, CHOLHDL, LDLDIRECT in the last 72 hours. Thyroid Function Tests: No results for input(s): TSH, T4TOTAL, FREET4, T3FREE, THYROIDAB in the last 72 hours. Anemia Panel: No results for input(s): VITAMINB12, FOLATE, FERRITIN, TIBC, IRON, RETICCTPCT in the last 72 hours. Sepsis Labs: Recent Labs  Lab 12/02/21 2345 12/03/21 0151  LATICACIDVEN 3.6* 1.7     Recent Results (from the past 240 hour(s))  Blood culture (routine x 2)     Status: None (Preliminary result)   Collection Time: 12/02/21  9:47 PM   Specimen: BLOOD  Result Value Ref Range Status   Specimen Description BLOOD LEFT ANTECUBITAL  Final   Special Requests   Final    BOTTLES DRAWN AEROBIC AND ANAEROBIC Blood Culture results may not be optimal due  to an excessive volume of blood received in culture bottles   Culture   Final    NO GROWTH 3 DAYS Performed at [facility], [address], [place], [place] [address]    Report Status PENDING  Incomplete  Resp Panel by RT-PCR (Flu A&B, Covid) Nasopharyngeal Swab     Status: None   Collection Time: 12/02/21  9:47 PM   Specimen: Nasopharyngeal Swab; Nasopharyngeal(NP) swabs in vial transport medium  Result Value Ref Range Status   SARS Coronavirus 2 by RT PCR NEGATIVE NEGATIVE Final    Comment: (NOTE) SARS-CoV-2 target nucleic acids are NOT DETECTED.  The SARS-CoV-2 RNA is generally detectable in upper respiratory specimens during the acute phase of infection. The lowest concentration of SARS-CoV-2 viral copies this assay can detect is 138 copies/mL. A negative result does not preclude SARS-Cov-2 infection and should not be used as the sole basis for treatment or other patient management decisions. A negative result may occur with  improper specimen collection/handling, submission of specimen other than nasopharyngeal swab, presence of viral mutation(s) within the areas targeted by this assay, and inadequate number of viral copies(<138 copies/mL). A negative result must be combined with clinical observations, patient history, and epidemiological information. The expected result is Negative.  Fact Sheet for Patients:  EntrepreneurPulse.com.au  Fact Sheet for Healthcare Providers:  IncredibleEmployment.be  This test is no t yet approved or cleared by the [place] FDA and  has been authorized for detection and/or diagnosis of SARS-CoV-2 by FDA under an Emergency Use Authorization (EUA). This EUA will remain  in effect (meaning this test can be used) for the duration of the COVID-19 declaration under Section 564(b)(1) of the Act, 21 U.S.C.section 360bbb-3(b)(1), unless the authorization is terminated  or revoked sooner.        Influenza A by PCR NEGATIVE NEGATIVE Final   Influenza B by PCR NEGATIVE NEGATIVE Final    Comment: (NOTE) The Xpert Xpress SARS-CoV-2/FLU/RSV plus assay is intended as an aid in the diagnosis of influenza from Nasopharyngeal swab specimens and should not be used as a sole basis for treatment. Nasal washings and aspirates are  unacceptable for Xpert Xpress SARS-CoV-2/FLU/RSV testing.  Fact Sheet for Patients: EntrepreneurPulse.com.au  Fact Sheet for Healthcare Providers: IncredibleEmployment.be  This test is not yet approved or cleared by the [place] FDA and has been authorized for detection and/or diagnosis of SARS-CoV-2 by FDA under an Emergency Use Authorization (EUA). This EUA will remain in effect (meaning this test can be used) for the duration of the COVID-19 declaration under Section 564(b)(1) of the Act, 21 U.S.C. section 360bbb-3(b)(1), unless the authorization is terminated or revoked.  Performed at [facility], [address] [place], [place] [address]   Urine Culture     Status: Abnormal   Collection Time: 12/03/21 12:14 AM   Specimen: Urine, Random  Result Value Ref Range Status   Specimen Description   Final    URINE, RANDOM Performed at [facility], [address] [place], [place] [address]    Special Requests   Final    NONE Performed at [facility], [address] [place], [place] [address]    Culture >=100,000 COLONIES/mL KLEBSIELLA PNEUMONIAE (A)  Final   Report Status 12/05/2021 FINAL  Final   Organism ID, Bacteria KLEBSIELLA PNEUMONIAE (A)  Final      Susceptibility   Klebsiella pneumoniae - MIC*    AMPICILLIN >=32 RESISTANT Resistant     CEFAZOLIN <=4 SENSITIVE Sensitive     CEFEPIME <=0.12 SENSITIVE Sensitive     CEFTRIAXONE <=0.25 SENSITIVE Sensitive     CIPROFLOXACIN <=0.25 SENSITIVE Sensitive     GENTAMICIN <=1 SENSITIVE Sensitive     IMIPENEM <=0.25 SENSITIVE  Sensitive     NITROFURANTOIN <=16 SENSITIVE Sensitive     TRIMETH/SULFA <=20 SENSITIVE Sensitive     AMPICILLIN/SULBACTAM 16 INTERMEDIATE Intermediate     PIP/TAZO <=4 SENSITIVE Sensitive     * >=100,000 COLONIES/mL KLEBSIELLA PNEUMONIAE  Blood culture (routine x 2)     Status: None (Preliminary result)   Collection Time: 12/03/21  6:40 AM   Specimen: BLOOD  Result Value Ref Range Status   Specimen Description BLOOD RIGHT ANTECUBITAL  Final   Special Requests   Final    BOTTLES DRAWN AEROBIC AND ANAEROBIC Blood Culture adequate volume   Culture   Final    NO GROWTH 2 DAYS Performed at [facility], [address] [place], [place] [address]    Report Status PENDING  Incomplete     RN Pressure Injury Documentation:     Estimated body mass index is 27.88 kg/m as calculated from the following:   Height as of this encounter: 5\' 7"  (1.702 m).   Weight as of 10/18/18: 80.7 kg.  Malnutrition Type:   Malnutrition Characteristics:   Nutrition Interventions:   Radiology Studies: DG Chest Port 1 View  Result Date: 12/05/2021 CLINICAL DATA:  Shortness of breath EXAM: PORTABLE CHEST 1 VIEW COMPARISON:  12/02/2021 FINDINGS: Cardiac shadow is enlarged but stable. Lungs are hypoinflated. Increasing airspace opacity is noted in the left lung. No sizable effusion is seen. No bony abnormality is noted. IMPRESSION: Increasing left basilar infiltrate. Electronically Signed   By: Inez Catalina M.D.   On: 12/05/2021 02:40    Scheduled Meds:  aspirin EC  81 mg Oral Daily   enoxaparin (LOVENOX) injection  30 mg Subcutaneous Q24H   gabapentin  200 mg Oral TID   guaiFENesin  600 mg Oral BID   insulin aspart  0-5 Units Subcutaneous QHS   insulin aspart  0-9 Units Subcutaneous TID WC   insulin glargine-yfgn  15  Units Subcutaneous Daily   ipratropium  0.5 mg Nebulization Q6H   levalbuterol  0.63 mg Nebulization Q6H   levothyroxine  50 mcg Oral Q0600   methylPREDNISolone (SOLU-MEDROL)  injection  60 mg Intravenous Q24H   metoprolol succinate  50 mg Oral BID   potassium chloride  20 mEq Oral Once   torsemide  40 mg Oral Daily   Continuous Infusions:  cefTRIAXone (ROCEPHIN)  IV 2 g (12/04/21 2355)   lactated ringers      LOS: 2 days   Kerney Elbe, DO [facility] PAGER is on [place]  If 7PM-7AM, please contact night-coverage [email or web address]

## 2021-12-05 NOTE — Progress Notes (Signed)
Occupational Therapy Treatment Patient Details Name: Crystal Delacruz MRN: [id number] DOB: October 28, 1924 Today's Date: 12/05/2021   History of present illness 88 y.o. female with medical history significant for asthma, hypertension, hypothyroidism, dementia, stage IIIa chronic kidney disease, dyslipidemia, osteoarthritis and type 2 diabetes mellitus, who presented to emergency room with acute onset of generalized weakness and altered mental status.   OT comments  Pt seen for OT treatment on this date. Upon arrival to room, pt awake and seated upright in bed with husband present. Pt disoriented to self, place, date, and situation. Pt also appeared to be hallucinating, stating that she was "seeing critters" in the room. Husband reported that pt's cognition is different than baseline. At start of session, pt's Men[place] was also doffed, with SpO2 reading 87%.  reapplied and SpO2 quickly increased to >92%. This author attempted to engage pt in bed-level grooming tasks, however pt was resistive to any verbal cues,  tactile cues, or physical assist from this author and from pt's husband. Pt's cognition continues to significantly impact pt's ability to participate in OT tx. Pt would benefit from additional skilled OT services to maximize return to PLOF and minimize risk of future falls, injury, caregiver burden, and readmission. Will continue to follow POC. Discharge recommendation remains appropriate.     Recommendations for follow up therapy are one component of a multi-disciplinary discharge planning process, led by the attending physician.  Recommendations may be updated based on patient status, additional functional criteria and insurance authorization.    Follow Up Recommendations  Skilled nursing-short term rehab (<3 hours/day)    Assistance Recommended at Discharge Frequent or constant Supervision/Assistance  Patient can return home with the following  Two people to help with walking and/or transfers;Two people  to help with bathing/dressing/bathroom;Assistance with cooking/housework;Assist for transportation;Help with stairs or ramp for entrance;Direct supervision/assist for medications management   Equipment Recommendations  None recommended by OT       Precautions / Restrictions Precautions Precautions: Fall Restrictions Weight Bearing Restrictions: No       Mobility Bed Mobility               General bed mobility comments: deferred d/t pt with poor command follow                         ADL either performed or assessed with clinical judgement   ADL Overall ADL's : Needs assistance/impaired     Grooming: Wash/dry hands;Wash/dry face;Maximal assistance;Bed level Grooming Details (indicate cue type and reason): Pt resistive of all attempts to engage pt in bed-level grooming tasks                                      Cognition Arousal/Alertness: Awake/alert Behavior During Therapy: Agitated Overall Cognitive Status: History of cognitive impairments - at baseline                                 General Comments: Pt disoriented to self, place, date, and situation. Husband reports that cognition is different than baseline. Pt also hallucinating; pts that she is "seeing critters" in the room                   Pertinent Vitals/ Pain       Pain Assessment Pain Assessment: No/denies pain  Frequency  Min 2X/week        Progress Toward Goals  OT Goals(current goals can now be found in the care plan section)  Progress towards OT goals: Progressing toward goals  Acute Rehab OT Goals Patient Stated Goal: go home with family OT Goal Formulation: With patient/family Time For Goal Achievement: 12/17/21 Potential to Achieve Goals: Good  Plan Discharge plan remains appropriate;Frequency remains appropriate       AM-PAC OT "6 Clicks" Daily Activity     Outcome Measure   Help from another person eating meals?:  None Help from another person taking care of personal grooming?: A Lot Help from another person toileting, which includes using toliet, bedpan, or urinal?: Total Help from another person bathing (including washing, rinsing, drying)?: A Lot Help from another person to put on and taking off regular upper body clothing?: A Little Help from another person to put on and taking off regular lower body clothing?: A Lot 6 Click Score: 14    End of Session Equipment Utilized During Treatment: Oxygen  OT Visit Diagnosis: Other abnormalities of gait and mobility (R26.89);History of falling (Z91.81);Muscle weakness (generalized) (M62.81)   Activity Tolerance Patient tolerated treatment well   Patient Left in bed;with call bell/phone within reach;with bed alarm set;with family/visitor present   Nurse Communication Mobility status        Time: 2111-5520 OT Time Calculation (min): 16 min  Charges: OT General Charges $OT Visit: 1 Visit OT Treatments $Self Care/Home Management : 8-22 mins  Fredirick Maudlin, OTR/L [place]

## 2021-12-05 NOTE — Progress Notes (Signed)
PT Cancellation Note  Patient Details Name: Crystal Delacruz MRN: [id number] DOB: 19-Jan-1924   Cancelled Treatment:    Reason Eval/Treat Not Completed: Patient at procedure or test/unavailable.  Pt currently not in hospital room--just left for testing (not available for PT session).  Will re-attempt PT session at a later date/time.  Leitha Bleak, PT 12/05/21, 4:11 PM

## 2021-12-06 ENCOUNTER — Inpatient Hospital Stay: Payer: PPO

## 2021-12-06 LAB — COMPREHENSIVE METABOLIC PANEL
ALT: 17 U/L (ref 0–44)
AST: 18 U/L (ref 15–41)
Albumin: 3.1 g/dL — ABNORMAL LOW (ref 3.5–5.0)
Alkaline Phosphatase: 50 U/L (ref 38–126)
Anion gap: 7 (ref 5–15)
BUN: 22 mg/dL (ref 8–23)
CO2: 29 mmol/L (ref 22–32)
Calcium: 8.8 mg/dL — ABNORMAL LOW (ref 8.9–10.3)
Chloride: 103 mmol/L (ref 98–111)
Creatinine, Ser: 1.02 mg/dL — ABNORMAL HIGH (ref 0.44–1.00)
GFR, Estimated: 51 mL/min — ABNORMAL LOW (ref >60)
Glucose, Bld: 203 mg/dL — ABNORMAL HIGH (ref 70–99)
Potassium: 4.1 mmol/L (ref 3.5–5.1)
Sodium: 139 mmol/L (ref 135–145)
Total Bilirubin: 0.4 mg/dL (ref 0.3–1.2)
Total Protein: 6.5 g/dL (ref 6.5–8.1)

## 2021-12-06 LAB — CBC WITH DIFFERENTIAL/PLATELET
Abs Immature Granulocytes: 0.04 10*3/uL (ref 0.00–0.07)
Basophils Absolute: 0 10*3/uL (ref 0.0–0.1)
Basophils Relative: 0 %
Eosinophils Absolute: 0 10*3/uL (ref 0.0–0.5)
Eosinophils Relative: 0 %
HCT: 39.2 % (ref 36.0–46.0)
Hemoglobin: 12.1 g/dL (ref 12.0–15.0)
Immature Granulocytes: 0 %
Lymphocytes Relative: 11 %
Lymphs Abs: 1 10*3/uL (ref 0.7–4.0)
MCH: 30.2 pg (ref 26.0–34.0)
MCHC: 30.9 g/dL (ref 30.0–36.0)
MCV: 97.8 fL (ref 80.0–100.0)
Monocytes Absolute: 0.1 10*3/uL (ref 0.1–1.0)
Monocytes Relative: 1 %
Neutro Abs: 8.2 10*3/uL — ABNORMAL HIGH (ref 1.7–7.7)
Neutrophils Relative %: 88 %
Platelets: 194 10*3/uL (ref 150–400)
RBC: 4.01 MIL/uL (ref 3.87–5.11)
RDW: 14.2 % (ref 11.5–15.5)
WBC: 9.4 10*3/uL (ref 4.0–10.5)
nRBC: 0 % (ref 0.0–0.2)

## 2021-12-06 LAB — GLUCOSE, CAPILLARY
Glucose-Capillary: 186 mg/dL — ABNORMAL HIGH (ref 70–99)
Glucose-Capillary: 154 mg/dL — ABNORMAL HIGH (ref 70–99)
Glucose-Capillary: 140 mg/dL — ABNORMAL HIGH (ref 70–99)
Glucose-Capillary: 210 mg/dL — ABNORMAL HIGH (ref 70–99)

## 2021-12-06 LAB — PHOSPHORUS: Phosphorus: 3.4 mg/dL (ref 2.5–4.6)

## 2021-12-06 LAB — MAGNESIUM: Magnesium: 1.8 mg/dL (ref 1.7–2.4)

## 2021-12-06 MED ORDER — LEVALBUTEROL HCL 0.63 MG/3ML IN NEBU
0.6300 mg | INHALATION_SOLUTION | Freq: Two times a day (BID) | RESPIRATORY_TRACT | Status: DC
  Filled 2021-12-06: qty 3

## 2021-12-06 MED ORDER — IPRATROPIUM BROMIDE 0.02 % IN SOLN
0.5000 mg | Freq: Two times a day (BID) | RESPIRATORY_TRACT | Status: DC
  Filled 2021-12-06: qty 2.5

## 2021-12-06 NOTE — Progress Notes (Signed)
Physical Therapy Treatment Patient Details Name: Crystal Delacruz MRN: [id number] DOB: 02-12-1924 Today's Date: 12/06/2021   History of Present Illness 88 y.o. female with medical history significant for asthma, hypertension, hypothyroidism, dementia, stage IIIa chronic kidney disease, dyslipidemia, osteoarthritis and type 2 diabetes mellitus, who presented to emergency room with acute onset of generalized weakness and altered mental status.    PT Comments    Pt pleasant and willing to participate with PT services but ultimately was very weak functionally and required extensive physical assistance during the session.  Pt needed heavy assist to come to standing from an elevated surface and once in standing despite max encouragement was only able to take 2-3 very small, shuffling steps at the EOB before needing to return to sitting secondary to fatigue.  Per son in room pt presents with a significant decline in functional status compared to her baseline where she was a household ambulator.  Pt would not be safe to return to her prior living situation at this time and is at a very high risk for falls.  Pt will benefit from PT services in a SNF setting upon discharge to safely address deficits listed in patient problem list for decreased caregiver assistance and eventual return to PLOF.    Recommendations for follow up therapy are one component of a multi-disciplinary discharge planning process, led by the attending physician.  Recommendations may be updated based on patient status, additional functional criteria and insurance authorization.  Follow Up Recommendations  Skilled nursing-short term rehab (<3 hours/day)     Assistance Recommended at Discharge Frequent or constant Supervision/Assistance  Patient can return home with the following Two people to help with walking and/or transfers;Two people to help with bathing/dressing/bathroom;Assistance with cooking/housework;Direct supervision/assist for  medications management;Direct supervision/assist for financial management;Assist for transportation;Help with stairs or ramp for entrance   Equipment Recommendations  None recommended by PT    Recommendations for Other Services       Precautions / Restrictions Precautions Precautions: Fall Restrictions Weight Bearing Restrictions: No     Mobility  Bed Mobility Overal bed mobility: Needs Assistance Bed Mobility: Rolling Rolling: Mod assist   Supine to sit: Max assist Sit to supine: Max assist   General bed mobility comments: Log roll training provided with pt needing extensive assist for BLE and trunk control    Transfers Overall transfer level: Needs assistance Equipment used: Rolling walker (2 wheels) Transfers: Sit to/from Stand Sit to Stand: Mod assist, From elevated surface, +2 physical assistance           General transfer comment: extensive cues for sequencing and heavy +2 assist to come to standing    Ambulation/Gait Ambulation/Gait assistance: Min assist Gait Distance (Feet): 1 Feet Assistive device: Rolling walker (2 wheels) Gait Pattern/deviations: Step-to pattern, Trunk flexed, Decreased step length - right, Decreased step length - left Gait velocity: decreased     General Gait Details: Pt only able to take a max of 2-3 very small shuffling steps at the EOB before needing to return to sitting secodnary to fatigue   Stairs             Wheelchair Mobility    Modified Rankin (Stroke Patients Only)       Balance Overall balance assessment: Needs assistance, History of Falls Sitting-balance support: Feet supported, Single extremity supported Sitting balance-Leahy Scale: Poor Sitting balance - Comments: Occasional min A to prevent posterior LOB Postural control: Posterior lean Standing balance support: Bilateral upper extremity supported, Reliant on assistive device  for balance, During functional activity Standing balance-Leahy Scale:  Poor                              Cognition Arousal/Alertness: Awake/alert Behavior During Therapy: WFL for tasks assessed/performed Overall Cognitive Status: History of cognitive impairments - at baseline                                          Exercises Other Exercises Other Exercises: Anterior weight shifting activities in sitting to address posterior instability    General Comments        Pertinent Vitals/Pain Pain Assessment Pain Assessment: No/denies pain    Home Living                          Prior Function            PT Goals (current goals can now be found in the care plan section) Progress towards PT goals: PT to reassess next treatment    Frequency    Min 2X/week      PT Plan Discharge plan needs to be updated    Co-evaluation              AM-PAC PT "6 Clicks" Mobility   Outcome Measure  Help needed turning from your back to your side while in a flat bed without using bedrails?: A Lot Help needed moving from lying on your back to sitting on the side of a flat bed without using bedrails?: A Lot Help needed moving to and from a bed to a chair (including a wheelchair)?: A Lot Help needed standing up from a chair using your arms (e.g., wheelchair or bedside chair)?: A Lot Help needed to walk in hospital room?: Total Help needed climbing 3-5 steps with a railing? : Total 6 Click Score: 10    End of Session Equipment Utilized During Treatment: Gait belt Activity Tolerance: Patient tolerated treatment well Patient left: in bed;with call bell/phone within reach;with bed alarm set;with family/visitor present Nurse Communication: Mobility status PT Visit Diagnosis: Unsteadiness on feet (R26.81);Other abnormalities of gait and mobility (R26.89);Muscle weakness (generalized) (M62.81);History of falling (Z91.81);Difficulty in walking, not elsewhere classified (R26.2)     Time: 9371-6967 PT Time Calculation  (min) (ACUTE ONLY): 26 min  Charges:  $Therapeutic Activity: 23-37 mins                    D. Scott Seeley Hissong PT, DPT 12/06/21, 1:30 PM

## 2021-12-06 NOTE — Care Management Important Message (Signed)
Important Message  Patient Details  Name: Crystal Delacruz MRN: [id number] Date of Birth: 11/09/1923   Medicare Important Message Given:  Yes     Juliann Pulse A Johnothan Bascomb 12/06/2021, 10:44 AM

## 2021-12-06 NOTE — Plan of Care (Signed)

## 2021-12-06 NOTE — Progress Notes (Signed)
PROGRESS NOTE    Crystal Delacruz  BWG:[id number] DOB: [birth year] DOA: 12/02/2021 PCP: Kirk Ruths, MD  102A/102A-AA   Assessment & Plan:   Principal Problem:   Multifocal pneumonia   The patient is a 88 year old elderly Caucasian female with a past medical history significant for but not limited too asthma, hypertension, hypothyroidism, history of dementia, stage III chronic kidney disease, history of dyslipidemia, arthritis, type II days mellitus as well as other comorbidities who presented to the ED with acute generalized eyes onset of weakness and altered mental status and confusion.  She has not been eating or drinking much lately and she appeared to be short of breath with associated cough and chest swelling with inability to expectorate.  She had a T-max of 101 at home with associated chills which she was given p.o. Tylenol.  Has not been having much urine output with diminished p.o. intake and admits to having significant dysuria and burning on her urine.  Because of the symptoms she presented to the ED and respiratory rate was noted to be 26 and a pulse oximetry was 88%.  Is placed on 2 L of supplemental oxygen via nasal cannula and her O2 saturation improved 94%.  Temperature is normal at at 99.3.  Labs revealed a leukocytosis of 13 with neutrophilia and a BMP showed an elevated BUN/creatinine from her prior baseline.  Imaging done and showed a chest x-ray which showed multifocal patchy opacities in the left upper lobe predominant favoring multifocal pneumonia.  She is given IV ceftriaxone and azithromycin as well as 2 half liters of IV lactated ringer boluses and then placed on 150 MLS per hour.  IV fluid hydration has now stopped.  She was admitted to the medical telemetry bed for the further evaluation management of sepsis secondary to multifocal pneumonia as well as concomitant UTI.   Sepsis secondary to multifocal pneumonia with concomitant UTI  --leukocytosis, tachypnea.    --received IVF  Acute hypoxic respiratory failure 2/2 Strep Pneumo PNA --started on ceftriaxone and azithromycin.  Azithromycin d/c'ed after strep pneumo came back positive. --cont ceftriaxone and azithro for 5-day course --Continue supplemental O2 to keep sats >=92%, wean as tolerated  Possible asthma exacerbation --started on IV solumedrol --transition to prednisone 40 mg daily for a quick taper  Acute Klebsiella UTI  --cont ceftriaxone   AKI on CKD stage IIIa -She is given IV fluid hydration with normal and currently getting maintenance rate at 100 MLS per hour -Patient's BUNs/creatinine went from 25/1.3-> 22/1.04 -> 18/1.01 -> 17/0.79 --oral hydration now   Hypokalemia --monitor and replete PRN   Normocytic Anemia, ruled out   Diabetes mellitus type 2 with hypoglycemia and associated peripheral neuropathy -We will hold her home glimepiride and continue sensitive NovoLog sliding scale insulin AC and basal coverage with Semglee 15 units -Continue Neurontin -Checked HbA1c was 7.6   Hypothyroidism -Continue Levothyroxine 50 mcg daily   Essential Hypertension -Continue her Toprol-XL   Thrombocytopenia --not clinically significant   Overweight -Complicates overall prognosis and care -Estimated body mass index is 27.88 kg/m as calculated from the following:   Height as of this encounter: 5\' 7"  (1.702 m).   Weight as of 10/18/18: 80.7 kg.  -Weight Loss and Dietary Counseling given    DVT prophylaxis: Lovenox SQ Code Status: Full code  Family Communication: husband updated at bedside today.  Level of care: Telemetry Medical Dispo:   The patient is from: home Anticipated d/c is to: home Anticipated d/c date is: tomorrow Patient  currently is not medically ready to d/c due to: on IV abx for PNA   Subjective and Interval History:  Pt denied dyspnea.     Objective: Vitals:   12/06/21 0438 12/06/21 0718 12/06/21 1148 12/06/21 1606  BP: 135/65 (!) 135/59  120/78 (!) 96/37  Pulse: 66 65 69 64  Resp: 16 18 19 18   Temp: 97.9 F (36.6 C) 98.9 F (37.2 C) 98.1 F (36.7 C) 98.5 F (36.9 C)  TempSrc: Oral Oral Oral Oral  SpO2: 95% 97% 93% 95%  Height:        Intake/Output Summary (Last 24 hours) at 12/06/2021 1926 Last data filed at 12/06/2021 1152 Gross per 24 hour  Intake --  Output 325 ml  Net -325 ml   There were no vitals filed for this visit.  Examination:   Constitutional: NAD, alert, oriented to person and place, somewhat confused HEENT: conjunctivae and lids normal, EOMI CV: No cyanosis.   RESP: normal respiratory effort, on 2L Neuro: II - XII grossly intact.   Psych: Normal mood and affect.     Data Reviewed: I have personally reviewed following labs and imaging studies  CBC: Recent Labs  Lab 12/02/21 2147 12/03/21 0649 12/04/21 0902 12/05/21 0532 12/06/21 0506  WBC 13.0* 12.6* 9.4 8.8 9.4  NEUTROABS 10.3*  --  6.2 7.7 8.2*  HGB 12.8 12.0 12.7 11.6* 12.1  HCT 42.0 38.8 41.0 37.5 39.2  MCV 99.5 98.7 98.8 97.7 97.8  PLT 214 145* 164 156 974   Basic Metabolic Panel: Recent Labs  Lab 12/02/21 2147 12/03/21 0649 12/04/21 0902 12/05/21 0532 12/06/21 0506  NA 137 142 138 137 139  K 4.4 3.9 3.8 3.4* 4.1  CL 95* 102 100 101 103  CO2 31 32 30 25 29   GLUCOSE 269* 65* 121* 208* 203*  BUN 25* 22 18 17 22   CREATININE 1.38* 1.04* 1.01* 0.79 1.02*  CALCIUM 8.7* 8.2* 8.0* 8.2* 8.8*  MG  --   --  1.9 1.9 1.8  PHOS  --   --  2.6 3.0 3.4   GFR: CrCl cannot be calculated (Unknown ideal weight.). Liver Function Tests: Recent Labs  Lab 12/04/21 0902 12/05/21 0532 12/06/21 0506  AST 17 17 18   ALT 11 12 17   ALKPHOS 58 51 50  BILITOT 0.5 0.5 0.4  PROT 6.6 5.8* 6.5  ALBUMIN 3.2* 2.8* 3.1*   No results for input(s): LIPASE, AMYLASE in the last 168 hours. No results for input(s): AMMONIA in the last 168 hours. Coagulation Profile: No results for input(s): INR, PROTIME in the last 168 hours. Cardiac Enzymes: No  results for input(s): CKTOTAL, CKMB, CKMBINDEX, TROPONINI in the last 168 hours. BNP (last 3 results) No results for input(s): PROBNP in the last 8760 hours. HbA1C: No results for input(s): HGBA1C in the last 72 hours. CBG: Recent Labs  Lab 12/05/21 1706 12/05/21 2154 12/06/21 0719 12/06/21 1149 12/06/21 1606  GLUCAP 149* 186* 186* 154* 140*   Lipid Profile: No results for input(s): CHOL, HDL, LDLCALC, TRIG, CHOLHDL, LDLDIRECT in the last 72 hours. Thyroid Function Tests: No results for input(s): TSH, T4TOTAL, FREET4, T3FREE, THYROIDAB in the last 72 hours. Anemia Panel: No results for input(s): VITAMINB12, FOLATE, FERRITIN, TIBC, IRON, RETICCTPCT in the last 72 hours. Sepsis Labs: Recent Labs  Lab 12/02/21 2345 12/03/21 0151  LATICACIDVEN 3.6* 1.7    Recent Results (from the past 240 hour(s))  Blood culture (routine x 2)     Status: None (Preliminary result)  Collection Time: 12/02/21  9:47 PM   Specimen: BLOOD  Result Value Ref Range Status   Specimen Description BLOOD LEFT ANTECUBITAL  Final   Special Requests   Final    BOTTLES DRAWN AEROBIC AND ANAEROBIC Blood Culture results may not be optimal due to an excessive volume of blood received in culture bottles   Culture   Final    NO GROWTH 4 DAYS Performed at [facility], [address], [place], [place] [address]    Report Status PENDING  Incomplete  Resp Panel by RT-PCR (Flu A&B, Covid) Nasopharyngeal Swab     Status: None   Collection Time: 12/02/21  9:47 PM   Specimen: Nasopharyngeal Swab; Nasopharyngeal(NP) swabs in vial transport medium  Result Value Ref Range Status   SARS Coronavirus 2 by RT PCR NEGATIVE NEGATIVE Final    Comment: (NOTE) SARS-CoV-2 target nucleic acids are NOT DETECTED.  The SARS-CoV-2 RNA is generally detectable in upper respiratory specimens during the acute phase of infection. The lowest concentration of SARS-CoV-2 viral copies this assay can detect is 138 copies/mL. A  negative result does not preclude SARS-Cov-2 infection and should not be used as the sole basis for treatment or other patient management decisions. A negative result may occur with  improper specimen collection/handling, submission of specimen other than nasopharyngeal swab, presence of viral mutation(s) within the areas targeted by this assay, and inadequate number of viral copies(<138 copies/mL). A negative result must be combined with clinical observations, patient history, and epidemiological information. The expected result is Negative.  Fact Sheet for Patients:  EntrepreneurPulse.com.au  Fact Sheet for Healthcare Providers:  IncredibleEmployment.be  This test is no t yet approved or cleared by the [place] FDA and  has been authorized for detection and/or diagnosis of SARS-CoV-2 by FDA under an Emergency Use Authorization (EUA). This EUA will remain  in effect (meaning this test can be used) for the duration of the COVID-19 declaration under Section 564(b)(1) of the Act, 21 U.S.C.section 360bbb-3(b)(1), unless the authorization is terminated  or revoked sooner.       Influenza A by PCR NEGATIVE NEGATIVE Final   Influenza B by PCR NEGATIVE NEGATIVE Final    Comment: (NOTE) The Xpert Xpress SARS-CoV-2/FLU/RSV plus assay is intended as an aid in the diagnosis of influenza from Nasopharyngeal swab specimens and should not be used as a sole basis for treatment. Nasal washings and aspirates are unacceptable for Xpert Xpress SARS-CoV-2/FLU/RSV testing.  Fact Sheet for Patients: EntrepreneurPulse.com.au  Fact Sheet for Healthcare Providers: IncredibleEmployment.be  This test is not yet approved or cleared by the [place] FDA and has been authorized for detection and/or diagnosis of SARS-CoV-2 by FDA under an Emergency Use Authorization (EUA). This EUA will remain in effect (meaning this test can  be used) for the duration of the COVID-19 declaration under Section 564(b)(1) of the Act, 21 U.S.C. section 360bbb-3(b)(1), unless the authorization is terminated or revoked.  Performed at [facility], [address] [place], [place] [address]   Urine Culture     Status: Abnormal   Collection Time: 12/03/21 12:14 AM   Specimen: Urine, Random  Result Value Ref Range Status   Specimen Description   Final    URINE, RANDOM Performed at [facility], [address], [place], [place] [address]    Special Requests   Final    NONE Performed at [facility], [address], [place], [place] [address]    Culture >=100,000 COLONIES/mL KLEBSIELLA PNEUMONIAE (  A)  Final   Report Status 12/05/2021 FINAL  Final   Organism ID, Bacteria KLEBSIELLA PNEUMONIAE (A)  Final      Susceptibility   Klebsiella pneumoniae - MIC*    AMPICILLIN >=32 RESISTANT Resistant     CEFAZOLIN <=4 SENSITIVE Sensitive     CEFEPIME <=0.12 SENSITIVE Sensitive     CEFTRIAXONE <=0.25 SENSITIVE Sensitive     CIPROFLOXACIN <=0.25 SENSITIVE Sensitive     GENTAMICIN <=1 SENSITIVE Sensitive     IMIPENEM <=0.25 SENSITIVE Sensitive     NITROFURANTOIN <=16 SENSITIVE Sensitive     TRIMETH/SULFA <=20 SENSITIVE Sensitive     AMPICILLIN/SULBACTAM 16 INTERMEDIATE Intermediate     PIP/TAZO <=4 SENSITIVE Sensitive     * >=100,000 COLONIES/mL KLEBSIELLA PNEUMONIAE  Blood culture (routine x 2)     Status: None (Preliminary result)   Collection Time: 12/03/21  6:40 AM   Specimen: BLOOD  Result Value Ref Range Status   Specimen Description BLOOD RIGHT ANTECUBITAL  Final   Special Requests   Final    BOTTLES DRAWN AEROBIC AND ANAEROBIC Blood Culture adequate volume   Culture   Final    NO GROWTH 3 DAYS Performed at [facility], [address] [place], [place] [address]    Report Status PENDING  Incomplete      Radiology Studies: CT CHEST W CONTRAST  Result Date: 12/05/2021 CLINICAL  DATA:  Pneumonia EXAM: CT CHEST WITH CONTRAST TECHNIQUE: Multidetector CT imaging of the chest was performed during intravenous contrast administration. RADIATION DOSE REDUCTION: This exam was performed according to the departmental dose-optimization program which includes automated exposure control, adjustment of the mA and/or kV according to patient size and/or use of iterative reconstruction technique. CONTRAST:  19mL OMNIPAQUE IOHEXOL 300 MG/ML  SOLN COMPARISON:  Previous studies including chest radiograph done earlier today and CT done on 10/23/2018. FINDINGS: Cardiovascular: There are scattered coronary artery calcifications. There is homogeneous enhancement in thoracic aorta. There is ectasia of ascending thoracic aorta measuring 4.4 cm. There are no intraluminal filling defects in the central pulmonary artery branches. Evaluation of small peripheral pulmonary artery branches is limited by motion artifacts and infiltrates. Mediastinum/Nodes: There few enlarged lymph nodes largest in the right paratracheal region measuring proximally 1.5 cm in diameter in the short axis with no significant interval change. There is inhomogeneous attenuation in thyroid with few coarse calcifications in the left lobe. Lungs/Pleura: Motion artifacts limit evaluation. There are linear patchy infiltrates in both parahilar regions and both lower lung fields. Small bilateral pleural effusions are seen. There is no pneumothorax. Upper Abdomen: There is fatty infiltration in the liver. Surgical clips are seen in gallbladder fossa. Musculoskeletal: Degenerative changes are noted in cervical, thoracic and lumbar spine. IMPRESSION: There are patchy infiltrates in both parahilar regions and both lower lung fields suggesting multifocal atelectasis/pneumonia. Small bilateral pleural effusions. No new significant lymphadenopathy seen in mediastinum. Coronary artery calcifications are seen. Fatty liver. Electronically Signed   By: Elmer Picker M.D.   On: 12/05/2021 16:43   DG Chest Port 1 View  Result Date: 12/06/2021 CLINICAL DATA:  Multifocal pneumonia. EXAM: PORTABLE CHEST 1 VIEW COMPARISON:  12/05/2021 FINDINGS: Stable elevation of the right hemidiaphragm. No significant change in the patchy densities in the mid and lower left chest. Heart and mediastinum are within normal limits and stable. Stable linear densities in the right upper lung. Again noted is levoscoliosis in the lower thoracic spine. Surgical hardware in the lumbar spine is partially imaged. Surgical clips in the  right upper abdomen. Negative for a pneumothorax. IMPRESSION: No significant change in the patchy bilateral lung disease, left side greater than right. Electronically Signed   By: Markus Daft M.D.   On: 12/06/2021 08:08   DG Chest Port 1 View  Result Date: 12/05/2021 CLINICAL DATA:  Shortness of breath EXAM: PORTABLE CHEST 1 VIEW COMPARISON:  12/02/2021 FINDINGS: Cardiac shadow is enlarged but stable. Lungs are hypoinflated. Increasing airspace opacity is noted in the left lung. No sizable effusion is seen. No bony abnormality is noted. IMPRESSION: Increasing left basilar infiltrate. Electronically Signed   By: Inez Catalina M.D.   On: 12/05/2021 02:40     Scheduled Meds:  aspirin EC  81 mg Oral Daily   enoxaparin (LOVENOX) injection  30 mg Subcutaneous Q24H   gabapentin  200 mg Oral TID   guaiFENesin  600 mg Oral BID   insulin aspart  0-5 Units Subcutaneous QHS   insulin aspart  0-9 Units Subcutaneous TID WC   insulin glargine-yfgn  15 Units Subcutaneous Daily   ipratropium  0.5 mg Nebulization BID   levalbuterol  0.63 mg Nebulization BID   levothyroxine  50 mcg Oral Q0600   methylPREDNISolone (SOLU-MEDROL) injection  60 mg Intravenous Q24H   metoprolol succinate  50 mg Oral BID   torsemide  40 mg Oral Daily   Continuous Infusions:  cefTRIAXone (ROCEPHIN)  IV 2 g (12/05/21 2318)   lactated ringers       LOS: 3 days     Enzo Bi,  MD [facility] If 7PM-7AM, please contact night-coverage 12/06/2021, 7:26 PM

## 2021-12-07 LAB — BASIC METABOLIC PANEL
Anion gap: 9 (ref 5–15)
BUN: 30 mg/dL — ABNORMAL HIGH (ref 8–23)
CO2: 30 mmol/L (ref 22–32)
Calcium: 8.9 mg/dL (ref 8.9–10.3)
Chloride: 100 mmol/L (ref 98–111)
Creatinine, Ser: 1.09 mg/dL — ABNORMAL HIGH (ref 0.44–1.00)
GFR, Estimated: 47 mL/min — ABNORMAL LOW (ref >60)
Glucose, Bld: 171 mg/dL — ABNORMAL HIGH (ref 70–99)
Potassium: 3.9 mmol/L (ref 3.5–5.1)
Sodium: 139 mmol/L (ref 135–145)

## 2021-12-07 LAB — GLUCOSE, CAPILLARY
Glucose-Capillary: 136 mg/dL — ABNORMAL HIGH (ref 70–99)
Glucose-Capillary: 150 mg/dL — ABNORMAL HIGH (ref 70–99)

## 2021-12-07 LAB — CBC
HCT: 38.3 % (ref 36.0–46.0)
Hemoglobin: 12.1 g/dL (ref 12.0–15.0)
MCH: 30.4 pg (ref 26.0–34.0)
MCHC: 31.6 g/dL (ref 30.0–36.0)
MCV: 96.2 fL (ref 80.0–100.0)
Platelets: 203 10*3/uL (ref 150–400)
RBC: 3.98 MIL/uL (ref 3.87–5.11)
RDW: 14.3 % (ref 11.5–15.5)
WBC: 9.1 10*3/uL (ref 4.0–10.5)
nRBC: 0 % (ref 0.0–0.2)

## 2021-12-07 LAB — MAGNESIUM: Magnesium: 2.1 mg/dL (ref 1.7–2.4)

## 2021-12-07 MED ORDER — PREDNISONE 20 MG PO TABS
40.0000 mg | ORAL_TABLET | Freq: Every day | ORAL | Status: DC
  Filled 2021-12-07: qty 2

## 2021-12-07 NOTE — Discharge Summary (Signed)
Physician Discharge Summary   Crystal Delacruz  female DOB: 23-Sep-1933  SHF:[id number]  PCP: Kirk Ruths, MD  Admit date: 12/02/2021 Discharge date: 12/07/2021  Admitted From: home Disposition:  declined SNF, husband said pt is at her baseline.  Husband to file for long-term care insurance payout to pay for in-home care. Son updated at bedside prior to discharge.  CODE STATUS: Full code   Hospital Course:  For full details, please see H&P, progress notes, consult notes and ancillary notes.  Briefly,  Crystal Delacruz is a 88 year old elderly Caucasian female with a past medical history significant for but not limited too asthma, hypertension, hypothyroidism, history of dementia, stage III chronic kidney disease, type II DM who presented to the ED with acute generalized eyes onset of weakness and altered mental status and confusion.    In the ED respiratory rate was noted to be 26 and a pulse oximetry was 88%.  Pt placed on 2 L of supplemental oxygen via nasal cannula and her O2 saturation improved 94%.  chest x-ray showed multifocal patchy opacities in the left upper lobe predominant favoring multifocal pneumonia.  She was given IV ceftriaxone and azithromycin as well as 2 half liters of IV lactated ringer boluses and then placed on 150 MLS per hour.     Sepsis secondary to multifocal pneumonia with concomitant UTI  --leukocytosis, tachypnea.   --received IVF   Acute hypoxic respiratory failure 2/2 Strep Pneumo PNA --started on ceftriaxone and azithromycin.  Azithromycin d/c'ed after strep pneumo came back positive. --completed 5 days of ceftriaxone. --Pt did not require supplemental O2 at discharge.   Possible asthma exacerbation --started on IV solumedrol and transitioned to prednisone 40 mg daily.  Pt didn't have significant wheezing, so was not discharged on further steroid.   Acute Klebsiella UTI  --treated with ceftriaxone   AKI on CKD stage IIIa --s/p IVF    Hypokalemia --monitor and replete PRN   Normocytic Anemia, ruled out --Hgb mostly wnl   Diabetes mellitus type 2 with  associated peripheral neuropathy -home glimepiride held and resumed after discharge. --cont home glargine 15u daily. -Continue Neurontin -Checked HbA1c was 7.6   Hypothyroidism -Continue Levothyroxine 50 mcg daily   Essential Hypertension -Continue her Toprol-XL and torsemide   Thrombocytopenia --not clinically significant   Overweight -Complicates overall prognosis and care -Estimated body mass index is 27.88 kg/m as calculated from the following:   Height as of this encounter: 5\' 7"  (1.702 m).   Weight as of 10/18/18: 80.7 kg.  -Weight Loss and Dietary Counseling given   Discharge Diagnoses:  Principal Problem:   Multifocal pneumonia   30 Day Unplanned Readmission Risk Score    Flowsheet Row ED to Hosp-Admission (Current) from 12/02/2021 in [place] (1C)  30 Day Unplanned Readmission Risk Score (%) 11.22 Filed at 12/07/2021 0801       This score is the patient's risk of an unplanned readmission within 30 days of being discharged (0 -100%). The score is based on dignosis, age, lab data, medications, orders, and past utilization.   Low:  0-14.9   Medium: 15-21.9   High: 22-29.9   Extreme: 30 and above         Discharge Instructions:  Allergies as of 12/07/2021       Reactions   Elemental Sulfur    Latex Swelling   Morphine And Related         Medication List     STOP taking these  medications    mometasone 0.1 % cream Commonly known as: ELOCON       TAKE these medications    acetaminophen 500 MG tablet Commonly known as: TYLENOL Take 500 mg by mouth every 6 (six) hours as needed.   aspirin EC 81 MG tablet Take 81 mg by mouth daily.   gabapentin 100 MG capsule Commonly known as: NEURONTIN Take 200 mg by mouth 3 (three) times daily.   glimepiride 4 MG tablet Commonly known as:  AMARYL Take 4 mg by mouth in the morning and at bedtime.   Lantus SoloStar 100 UNIT/ML Solostar Pen Generic drug: insulin glargine Inject 15 Units into the skin daily.   levothyroxine 50 MCG tablet Commonly known as: SYNTHROID Take 50 mcg by mouth daily.   metoprolol succinate 50 MG 24 hr tablet Commonly known as: TOPROL-XL Take 50 mg by mouth 2 (two) times daily.   nortriptyline 25 MG capsule Commonly known as: PAMELOR Take 25 mg by mouth 2 (two) times daily.   torsemide 20 MG tablet Commonly known as: DEMADEX Take 40 mg by mouth daily.          Allergies  Allergen Reactions   Elemental Sulfur    Latex Swelling   Morphine And Related      The results of significant diagnostics from this hospitalization (including imaging, microbiology, ancillary and laboratory) are listed below for reference.   Consultations:   Procedures/Studies: DG Chest 2 View  Result Date: 12/02/2021 CLINICAL DATA:  Shortness of breath EXAM: CHEST - 2 VIEW COMPARISON:  10/21/2018 FINDINGS: Multifocal patchy opacities, left upper lobe predominant, favoring multifocal pneumonia. No definite pleural effusions. No pneumothorax. The heart is normal in size. Degenerative changes of the visualized thoracolumbar spine. Lumbar spine fixation hardware. Cholecystectomy clips. IMPRESSION: Multifocal patchy opacities, left upper lobe predominant, favoring multifocal pneumonia. Electronically Signed   By: Julian Hy M.D.   On: 12/02/2021 22:19   CT CHEST W CONTRAST  Result Date: 12/05/2021 CLINICAL DATA:  Pneumonia EXAM: CT CHEST WITH CONTRAST TECHNIQUE: Multidetector CT imaging of the chest was performed during intravenous contrast administration. RADIATION DOSE REDUCTION: This exam was performed according to the departmental dose-optimization program which includes automated exposure control, adjustment of the mA and/or kV according to patient size and/or use of iterative reconstruction technique.  CONTRAST:  45mL OMNIPAQUE IOHEXOL 300 MG/ML  SOLN COMPARISON:  Previous studies including chest radiograph done earlier today and CT done on 10/23/2018. FINDINGS: Cardiovascular: There are scattered coronary artery calcifications. There is homogeneous enhancement in thoracic aorta. There is ectasia of ascending thoracic aorta measuring 4.4 cm. There are no intraluminal filling defects in the central pulmonary artery branches. Evaluation of small peripheral pulmonary artery branches is limited by motion artifacts and infiltrates. Mediastinum/Nodes: There few enlarged lymph nodes largest in the right paratracheal region measuring proximally 1.5 cm in diameter in the short axis with no significant interval change. There is inhomogeneous attenuation in thyroid with few coarse calcifications in the left lobe. Lungs/Pleura: Motion artifacts limit evaluation. There are linear patchy infiltrates in both parahilar regions and both lower lung fields. Small bilateral pleural effusions are seen. There is no pneumothorax. Upper Abdomen: There is fatty infiltration in the liver. Surgical clips are seen in gallbladder fossa. Musculoskeletal: Degenerative changes are noted in cervical, thoracic and lumbar spine. IMPRESSION: There are patchy infiltrates in both parahilar regions and both lower lung fields suggesting multifocal atelectasis/pneumonia. Small bilateral pleural effusions. No new significant lymphadenopathy seen in mediastinum. Coronary artery calcifications are  seen. Fatty liver. Electronically Signed   By: Elmer Picker M.D.   On: 12/05/2021 16:43   DG Chest Port 1 View  Result Date: 12/06/2021 CLINICAL DATA:  Multifocal pneumonia. EXAM: PORTABLE CHEST 1 VIEW COMPARISON:  12/05/2021 FINDINGS: Stable elevation of the right hemidiaphragm. No significant change in the patchy densities in the mid and lower left chest. Heart and mediastinum are within normal limits and stable. Stable linear densities in the right  upper lung. Again noted is levoscoliosis in the lower thoracic spine. Surgical hardware in the lumbar spine is partially imaged. Surgical clips in the right upper abdomen. Negative for a pneumothorax. IMPRESSION: No significant change in the patchy bilateral lung disease, left side greater than right. Electronically Signed   By: Markus Daft M.D.   On: 12/06/2021 08:08   DG Chest Port 1 View  Result Date: 12/05/2021 CLINICAL DATA:  Shortness of breath EXAM: PORTABLE CHEST 1 VIEW COMPARISON:  12/02/2021 FINDINGS: Cardiac shadow is enlarged but stable. Lungs are hypoinflated. Increasing airspace opacity is noted in the left lung. No sizable effusion is seen. No bony abnormality is noted. IMPRESSION: Increasing left basilar infiltrate. Electronically Signed   By: Inez Catalina M.D.   On: 12/05/2021 02:40      Labs: BNP (last 3 results) No results for input(s): BNP in the last 8760 hours. Basic Metabolic Panel: Recent Labs  Lab 12/03/21 0649 12/04/21 0902 12/05/21 0532 12/06/21 0506 12/07/21 0457  NA 142 138 137 139 139  K 3.9 3.8 3.4* 4.1 3.9  CL 102 100 101 103 100  CO2 32 30 25 29 30   GLUCOSE 65* 121* 208* 203* 171*  BUN 22 18 17 22  30*  CREATININE 1.04* 1.01* 0.79 1.02* 1.09*  CALCIUM 8.2* 8.0* 8.2* 8.8* 8.9  MG  --  1.9 1.9 1.8 2.1  PHOS  --  2.6 3.0 3.4  --    Liver Function Tests: Recent Labs  Lab 12/04/21 0902 12/05/21 0532 12/06/21 0506  AST 17 17 18   ALT 11 12 17   ALKPHOS 58 51 50  BILITOT 0.5 0.5 0.4  PROT 6.6 5.8* 6.5  ALBUMIN 3.2* 2.8* 3.1*   No results for input(s): LIPASE, AMYLASE in the last 168 hours. No results for input(s): AMMONIA in the last 168 hours. CBC: Recent Labs  Lab 12/02/21 2147 12/03/21 0649 12/04/21 0902 12/05/21 0532 12/06/21 0506 12/07/21 0457  WBC 13.0* 12.6* 9.4 8.8 9.4 9.1  NEUTROABS 10.3*  --  6.2 7.7 8.2*  --   HGB 12.8 12.0 12.7 11.6* 12.1 12.1  HCT 42.0 38.8 41.0 37.5 39.2 38.3  MCV 99.5 98.7 98.8 97.7 97.8 96.2  PLT 214  145* 164 156 194 203   Cardiac Enzymes: No results for input(s): CKTOTAL, CKMB, CKMBINDEX, TROPONINI in the last 168 hours. BNP: Invalid input(s): POCBNP CBG: Recent Labs  Lab 12/06/21 0719 12/06/21 1149 12/06/21 1606 12/06/21 2147 12/07/21 0801  GLUCAP 186* 154* 140* 210* 136*   D-Dimer No results for input(s): DDIMER in the last 72 hours. Hgb A1c No results for input(s): HGBA1C in the last 72 hours. Lipid Profile No results for input(s): CHOL, HDL, LDLCALC, TRIG, CHOLHDL, LDLDIRECT in the last 72 hours. Thyroid function studies No results for input(s): TSH, T4TOTAL, T3FREE, THYROIDAB in the last 72 hours.  Invalid input(s): FREET3 Anemia work up No results for input(s): VITAMINB12, FOLATE, FERRITIN, TIBC, IRON, RETICCTPCT in the last 72 hours. Urinalysis    Component Value Date/Time   COLORURINE YELLOW (A) 12/03/2021 0014  APPEARANCEUR CLOUDY (A) 12/03/2021 0014   APPEARANCEUR Turbid 09/10/2014 1250   LABSPEC 1.015 12/03/2021 0014   LABSPEC 1.013 09/10/2014 1250   PHURINE 8.5 (H) 12/03/2021 0014   GLUCOSEU NEGATIVE 12/03/2021 0014   GLUCOSEU Negative 09/10/2014 1250   HGBUR TRACE (A) 12/03/2021 0014   BILIRUBINUR NEGATIVE 12/03/2021 0014   BILIRUBINUR Negative 09/10/2014 1250   KETONESUR NEGATIVE 12/03/2021 0014   PROTEINUR TRACE (A) 12/03/2021 0014   NITRITE NEGATIVE 12/03/2021 0014   LEUKOCYTESUR LARGE (A) 12/03/2021 0014   LEUKOCYTESUR 3+ 09/10/2014 1250   Sepsis Labs Invalid input(s): PROCALCITONIN,  WBC,  LACTICIDVEN Microbiology Recent Results (from the past 240 hour(s))  Blood culture (routine x 2)     Status: None (Preliminary result)   Collection Time: 12/02/21  9:47 PM   Specimen: BLOOD  Result Value Ref Range Status   Specimen Description BLOOD LEFT ANTECUBITAL  Final   Special Requests   Final    BOTTLES DRAWN AEROBIC AND ANAEROBIC Blood Culture results may not be optimal due to an excessive volume of blood received in culture bottles    Culture   Final    NO GROWTH 4 DAYS Performed at [facility], [address] [place], [place] [address]    Report Status PENDING  Incomplete  Resp Panel by RT-PCR (Flu A&B, Covid) Nasopharyngeal Swab     Status: None   Collection Time: 12/02/21  9:47 PM   Specimen: Nasopharyngeal Swab; Nasopharyngeal(NP) swabs in vial transport medium  Result Value Ref Range Status   SARS Coronavirus 2 by RT PCR NEGATIVE NEGATIVE Final    Comment: (NOTE) SARS-CoV-2 target nucleic acids are NOT DETECTED.  The SARS-CoV-2 RNA is generally detectable in upper respiratory specimens during the acute phase of infection. The lowest concentration of SARS-CoV-2 viral copies this assay can detect is 138 copies/mL. A negative result does not preclude SARS-Cov-2 infection and should not be used as the sole basis for treatment or other patient management decisions. A negative result may occur with  improper specimen collection/handling, submission of specimen other than nasopharyngeal swab, presence of viral mutation(s) within the areas targeted by this assay, and inadequate number of viral copies(<138 copies/mL). A negative result must be combined with clinical observations, patient history, and epidemiological information. The expected result is Negative.  Fact Sheet for Patients:  EntrepreneurPulse.com.au  Fact Sheet for Healthcare Providers:  IncredibleEmployment.be  This test is no t yet approved or cleared by the [place] FDA and  has been authorized for detection and/or diagnosis of SARS-CoV-2 by FDA under an Emergency Use Authorization (EUA). This EUA will remain  in effect (meaning this test can be used) for the duration of the COVID-19 declaration under Section 564(b)(1) of the Act, 21 U.S.C.section 360bbb-3(b)(1), unless the authorization is terminated  or revoked sooner.       Influenza A by PCR NEGATIVE NEGATIVE Final   Influenza B by PCR  NEGATIVE NEGATIVE Final    Comment: (NOTE) The Xpert Xpress SARS-CoV-2/FLU/RSV plus assay is intended as an aid in the diagnosis of influenza from Nasopharyngeal swab specimens and should not be used as a sole basis for treatment. Nasal washings and aspirates are unacceptable for Xpert Xpress SARS-CoV-2/FLU/RSV testing.  Fact Sheet for Patients: EntrepreneurPulse.com.au  Fact Sheet for Healthcare Providers: IncredibleEmployment.be  This test is not yet approved or cleared by the [place] FDA and has been authorized for detection and/or diagnosis of SARS-CoV-2 by FDA under an Emergency Use Authorization (EUA). This EUA will remain in  effect (meaning this test can be used) for the duration of the COVID-19 declaration under Section 564(b)(1) of the Act, 21 U.S.C. section 360bbb-3(b)(1), unless the authorization is terminated or revoked.  Performed at [facility], [address] [place], [place] [address]   Urine Culture     Status: Abnormal   Collection Time: 12/03/21 12:14 AM   Specimen: Urine, Random  Result Value Ref Range Status   Specimen Description   Final    URINE, RANDOM Performed at [facility], [address] [place], [place] [address]    Special Requests   Final    NONE Performed at [facility], [address] [place], [place] [address]    Culture >=100,000 COLONIES/mL KLEBSIELLA PNEUMONIAE (A)  Final   Report Status 12/05/2021 FINAL  Final   Organism ID, Bacteria KLEBSIELLA PNEUMONIAE (A)  Final      Susceptibility   Klebsiella pneumoniae - MIC*    AMPICILLIN >=32 RESISTANT Resistant     CEFAZOLIN <=4 SENSITIVE Sensitive     CEFEPIME <=0.12 SENSITIVE Sensitive     CEFTRIAXONE <=0.25 SENSITIVE Sensitive     CIPROFLOXACIN <=0.25 SENSITIVE Sensitive     GENTAMICIN <=1 SENSITIVE Sensitive     IMIPENEM <=0.25 SENSITIVE Sensitive     NITROFURANTOIN <=16 SENSITIVE Sensitive      TRIMETH/SULFA <=20 SENSITIVE Sensitive     AMPICILLIN/SULBACTAM 16 INTERMEDIATE Intermediate     PIP/TAZO <=4 SENSITIVE Sensitive     * >=100,000 COLONIES/mL KLEBSIELLA PNEUMONIAE  Blood culture (routine x 2)     Status: None (Preliminary result)   Collection Time: 12/03/21  6:40 AM   Specimen: BLOOD  Result Value Ref Range Status   Specimen Description BLOOD RIGHT ANTECUBITAL  Final   Special Requests   Final    BOTTLES DRAWN AEROBIC AND ANAEROBIC Blood Culture adequate volume   Culture   Final    NO GROWTH 3 DAYS Performed at [facility], [address], [place],  [address]    Report Status PENDING  Incomplete     Total time spend on discharging this patient, including the last patient exam, discussing the hospital stay, instructions for ongoing care as it relates to all pertinent caregivers, as well as preparing the medical discharge records, prescriptions, and/or referrals as applicable, is 40 minutes.    Enzo Bi, MD  [facility] 12/07/2021, 8:49 AM

## 2021-12-07 NOTE — Progress Notes (Signed)
Occupational Therapy Treatment Patient Details Name: Crystal Delacruz MRN: [id number] DOB: 03-12-1924 Today's Date: 12/07/2021   History of present illness 88 y.o. female with medical history significant for asthma, hypertension, hypothyroidism, dementia, stage IIIa chronic kidney disease, dyslipidemia, osteoarthritis and type 2 diabetes mellitus, who presented to emergency room with acute onset of generalized weakness and altered mental status.   OT comments  Pt seen for OT tx this date to f/u re: safety with ADLs/ADL mobility. She requires MOD A with HOB elevated and consistent cueing (including to initiate) to come to EOB sitting. She demos P to F balance while in EOB sitting (improved with hips square and feet planted on the ground. OT engages her in seated face washing and oral care with MIN A for actual physical component of task and moderate cueing (including visual demonstration) to sequence throughout. Pt returned to bed with MAX A and requires MAX A for repositioning. She is left with all needs met and alarm set. Will continue to follow.    Recommendations for follow up therapy are one component of a multi-disciplinary discharge planning process, led by the attending physician.  Recommendations may be updated based on patient status, additional functional criteria and insurance authorization.    Follow Up Recommendations  Skilled nursing-short term rehab (<3 hours/day)    Assistance Recommended at Discharge Frequent or constant Supervision/Assistance  Patient can return home with the following  Two people to help with walking and/or transfers;Two people to help with bathing/dressing/bathroom;Assistance with cooking/housework;Assist for transportation;Help with stairs or ramp for entrance;Direct supervision/assist for medications management   Equipment Recommendations  None recommended by OT    Recommendations for Other Services      Precautions / Restrictions Precautions Precautions:  Fall Restrictions Weight Bearing Restrictions: No       Mobility Bed Mobility Overal bed mobility: Needs Assistance Bed Mobility: Supine to Sit, Sit to Supine     Supine to sit: Mod assist, HOB elevated Sit to supine: Max assist   General bed mobility comments: increased time and trunk support to come to sitting, increased assist to manage LEs back to bed.    Transfers                   General transfer comment: deferred     Balance Overall balance assessment: Needs assistance, History of Falls Sitting-balance support: Feet supported, Single extremity supported Sitting balance-Leahy Scale: Poor Sitting balance - Comments: Pt is mostly able to sustain static sitting balance with close standby and manual cues for hand placement for UE support, however, several bouts of LOB posteriorly or R lateral, requiring MIN A to right herself.       Standing balance comment: deferred                           ADL either performed or assessed with clinical judgement   ADL Overall ADL's : Needs assistance/impaired     Grooming: Wash/dry face;Oral care;Minimal assistance;Sitting;Cueing for sequencing Grooming Details (indicate cue type and reason): cues to initiate and visual/tactile cues for nearly all steps of task (example: modeling how to wipe mouth after rinsing).                                    Extremity/Trunk Assessment              Vision  Perception     Praxis      Cognition   Behavior During Therapy: WFL for tasks assessed/performed Overall Cognitive Status: History of cognitive impairments - at baseline Area of Impairment: Orientation, Problem solving, Following commands                 Orientation Level: Disoriented to, Place, Time, Situation     Following Commands: Follows multi-step commands inconsistently, Follows one step commands consistently     Problem Solving: Slow processing, Requires verbal  cues, Requires tactile cues General Comments: pt is not oriented at all this session; keeps mentioning horses and her dad. She also oscillates between being fearful and reporting "that lady is trying to kill me"  She can be easily re-directed and comforted, but requires this multiple times throughout session        Exercises Other Exercises Other Exercises: OT engages pt in seated grooming tasks on EOB To improve tolernace for seated ADLs and general OOB activity.    Shoulder Instructions       General Comments      Pertinent Vitals/ Pain       Pain Assessment Pain Assessment: PAINAD Breathing: normal Negative Vocalization: none Facial Expression: sad, frightened, frown Body Language: tense, distressed pacing, fidgeting Consolability: distracted or reassured by voice/touch PAINAD Score: 3 Pain Intervention(s): Limited activity within patient's tolerance, Monitored during session, Repositioned  Home Living                                          Prior Functioning/Environment              Frequency  Min 2X/week        Progress Toward Goals  OT Goals(current goals can now be found in the care plan section)  Progress towards OT goals: Progressing toward goals  Acute Rehab OT Goals Patient Stated Goal: none stated OT Goal Formulation: Patient unable to participate in goal setting Time For Goal Achievement: 12/17/21 Potential to Achieve Goals: Good  Plan Discharge plan remains appropriate;Frequency remains appropriate    Co-evaluation                 AM-PAC OT "6 Clicks" Daily Activity     Outcome Measure   Help from another person eating meals?: None Help from another person taking care of personal grooming?: A Lot Help from another person toileting, which includes using toliet, bedpan, or urinal?: Total Help from another person bathing (including washing, rinsing, drying)?: A Lot Help from another person to put on and taking off  regular upper body clothing?: A Little Help from another person to put on and taking off regular lower body clothing?: A Lot 6 Click Score: 14    End of Session    OT Visit Diagnosis: Other abnormalities of gait and mobility (R26.89);History of falling (Z91.81);Muscle weakness (generalized) (M62.81)   Activity Tolerance Patient tolerated treatment well;Other (comment) (limited by confusion/fear/anxiety, requiring constant reassurance)   Patient Left in bed;with call bell/phone within reach;with bed alarm set   Nurse Communication Mobility status        Time: 1914-7829 OT Time Calculation (min): 17 min  Charges: OT General Charges $OT Visit: 1 Visit OT Treatments $Self Care/Home Management : 8-22 mins  Gerrianne Scale, [place], OTR/L ascom [phone] 12/07/21, 3:35 PM

## 2021-12-07 NOTE — Progress Notes (Signed)
[facility]) Hospital Liaison Note  Received request from Transitions of Care Manager Dreama Saa, RN, to provide family with information related to hospice services.  Visited patient at bedside and spoke with husband and son to initiate education related to hospice philosophy, services and team approach to care. Family verbalized understanding of information provided. At this time family does not feel the patient needs hospice services.   ACC information and contact numbers given to family in the event they have further questions or feel that patient needs hospice services in the future. Above information shared with Dreama Saa, RN [facility] Manager.   Please do not hesitate to call with any hospice related questions or concerns.   Thank you,  Nadene Rubins, RN, BSN [facility] Liaison [phone]

## 2021-12-07 NOTE — TOC Progression Note (Addendum)
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  Son would like to speak to [facility] prior to discharge.  Contacted Loren Racer at [employer]  Son was looking for in home paid caregivers, Andee Poles from [employer] to contact son, Zyliah Schier via phone.  Son states he would like assistance for his father with lifting, offered hoyer lift, son stated he did not want this at this time due to floor plan of house.  Family was going to take patient home, but decided on EMS at 1320hours. EMS contacted    Expected Discharge Plan:  (TBD) Barriers to Discharge: Continued Medical Work up  Expected Discharge Plan and Services Expected Discharge Plan:  (TBD)   Discharge Planning Services: CM Consult Post Acute Care Choice:  (would like [place]) Living arrangements for the past 2 months: Single Family Home Expected Discharge Date: 12/07/21                                     Social Determinants of Health (SDOH) Interventions    Readmission Risk Interventions No flowsheet data found.

## 2021-12-07 NOTE — Plan of Care (Signed)

## 2021-12-08 LAB — CULTURE, BLOOD (ROUTINE X 2)
Culture: NO GROWTH
Special Requests: ADEQUATE

## 2021-12-10 LAB — CULTURE, BLOOD (ROUTINE X 2): Culture: NO GROWTH

## 2021-12-12 DIAGNOSIS — J189 Pneumonia, unspecified organism: Secondary | ICD-10-CM | POA: Diagnosis not present

## 2021-12-12 DIAGNOSIS — N390 Urinary tract infection, site not specified: Secondary | ICD-10-CM | POA: Diagnosis not present

## 2021-12-18 DIAGNOSIS — N183 Chronic kidney disease, stage 3 unspecified: Secondary | ICD-10-CM | POA: Diagnosis not present

## 2021-12-18 DIAGNOSIS — I10 Essential (primary) hypertension: Secondary | ICD-10-CM | POA: Diagnosis not present

## 2021-12-18 DIAGNOSIS — E1122 Type 2 diabetes mellitus with diabetic chronic kidney disease: Secondary | ICD-10-CM | POA: Diagnosis not present

## 2021-12-18 DIAGNOSIS — E039 Hypothyroidism, unspecified: Secondary | ICD-10-CM | POA: Diagnosis not present

## 2021-12-20 DIAGNOSIS — I2510 Atherosclerotic heart disease of native coronary artery without angina pectoris: Secondary | ICD-10-CM | POA: Diagnosis not present

## 2021-12-20 DIAGNOSIS — M48061 Spinal stenosis, lumbar region without neurogenic claudication: Secondary | ICD-10-CM | POA: Diagnosis not present

## 2021-12-20 DIAGNOSIS — B961 Klebsiella pneumoniae [K. pneumoniae] as the cause of diseases classified elsewhere: Secondary | ICD-10-CM | POA: Diagnosis not present

## 2021-12-20 DIAGNOSIS — N1831 Chronic kidney disease, stage 3a: Secondary | ICD-10-CM | POA: Diagnosis not present

## 2021-12-20 DIAGNOSIS — I129 Hypertensive chronic kidney disease with stage 1 through stage 4 chronic kidney disease, or unspecified chronic kidney disease: Secondary | ICD-10-CM | POA: Diagnosis not present

## 2021-12-20 DIAGNOSIS — E7849 Other hyperlipidemia: Secondary | ICD-10-CM | POA: Diagnosis not present

## 2021-12-20 DIAGNOSIS — G5600 Carpal tunnel syndrome, unspecified upper limb: Secondary | ICD-10-CM | POA: Diagnosis not present

## 2021-12-20 DIAGNOSIS — K219 Gastro-esophageal reflux disease without esophagitis: Secondary | ICD-10-CM | POA: Diagnosis not present

## 2021-12-20 DIAGNOSIS — E1122 Type 2 diabetes mellitus with diabetic chronic kidney disease: Secondary | ICD-10-CM | POA: Diagnosis not present

## 2021-12-20 DIAGNOSIS — M76899 Other specified enthesopathies of unspecified lower limb, excluding foot: Secondary | ICD-10-CM | POA: Diagnosis not present

## 2021-12-20 DIAGNOSIS — K589 Irritable bowel syndrome without diarrhea: Secondary | ICD-10-CM | POA: Diagnosis not present

## 2021-12-20 DIAGNOSIS — J9601 Acute respiratory failure with hypoxia: Secondary | ICD-10-CM | POA: Diagnosis not present

## 2021-12-20 DIAGNOSIS — M1990 Unspecified osteoarthritis, unspecified site: Secondary | ICD-10-CM | POA: Diagnosis not present

## 2021-12-20 DIAGNOSIS — M5136 Other intervertebral disc degeneration, lumbar region: Secondary | ICD-10-CM | POA: Diagnosis not present

## 2021-12-20 DIAGNOSIS — Z9181 History of falling: Secondary | ICD-10-CM | POA: Diagnosis not present

## 2021-12-20 DIAGNOSIS — H409 Unspecified glaucoma: Secondary | ICD-10-CM | POA: Diagnosis not present

## 2021-12-20 DIAGNOSIS — F0280 Dementia in other diseases classified elsewhere without behavioral disturbance: Secondary | ICD-10-CM | POA: Diagnosis not present

## 2021-12-20 DIAGNOSIS — N390 Urinary tract infection, site not specified: Secondary | ICD-10-CM | POA: Diagnosis not present

## 2021-12-20 DIAGNOSIS — E1142 Type 2 diabetes mellitus with diabetic polyneuropathy: Secondary | ICD-10-CM | POA: Diagnosis not present

## 2021-12-20 DIAGNOSIS — J440 Chronic obstructive pulmonary disease with acute lower respiratory infection: Secondary | ICD-10-CM | POA: Diagnosis not present

## 2021-12-20 DIAGNOSIS — D696 Thrombocytopenia, unspecified: Secondary | ICD-10-CM | POA: Diagnosis not present

## 2021-12-20 DIAGNOSIS — E663 Overweight: Secondary | ICD-10-CM | POA: Diagnosis not present

## 2021-12-20 DIAGNOSIS — J13 Pneumonia due to Streptococcus pneumoniae: Secondary | ICD-10-CM | POA: Diagnosis not present

## 2021-12-20 DIAGNOSIS — E039 Hypothyroidism, unspecified: Secondary | ICD-10-CM | POA: Diagnosis not present

## 2021-12-25 DIAGNOSIS — T466X5A Adverse effect of antihyperlipidemic and antiarteriosclerotic drugs, initial encounter: Secondary | ICD-10-CM | POA: Diagnosis not present

## 2021-12-25 DIAGNOSIS — I700 Atherosclerosis of aorta: Secondary | ICD-10-CM | POA: Diagnosis not present

## 2021-12-25 DIAGNOSIS — Z0000 Encounter for general adult medical examination without abnormal findings: Secondary | ICD-10-CM | POA: Diagnosis not present

## 2021-12-25 DIAGNOSIS — M791 Myalgia, unspecified site: Secondary | ICD-10-CM | POA: Diagnosis not present

## 2021-12-25 DIAGNOSIS — E039 Hypothyroidism, unspecified: Secondary | ICD-10-CM | POA: Diagnosis not present

## 2021-12-25 DIAGNOSIS — N183 Chronic kidney disease, stage 3 unspecified: Secondary | ICD-10-CM | POA: Diagnosis not present

## 2021-12-25 DIAGNOSIS — E1122 Type 2 diabetes mellitus with diabetic chronic kidney disease: Secondary | ICD-10-CM | POA: Diagnosis not present

## 2021-12-25 DIAGNOSIS — I10 Essential (primary) hypertension: Secondary | ICD-10-CM | POA: Diagnosis not present

## 2022-01-03 DIAGNOSIS — N1831 Chronic kidney disease, stage 3a: Secondary | ICD-10-CM | POA: Diagnosis not present

## 2022-01-03 DIAGNOSIS — G5600 Carpal tunnel syndrome, unspecified upper limb: Secondary | ICD-10-CM | POA: Diagnosis not present

## 2022-01-03 DIAGNOSIS — E7849 Other hyperlipidemia: Secondary | ICD-10-CM | POA: Diagnosis not present

## 2022-01-03 DIAGNOSIS — E1142 Type 2 diabetes mellitus with diabetic polyneuropathy: Secondary | ICD-10-CM | POA: Diagnosis not present

## 2022-01-03 DIAGNOSIS — D696 Thrombocytopenia, unspecified: Secondary | ICD-10-CM | POA: Diagnosis not present

## 2022-01-03 DIAGNOSIS — I2510 Atherosclerotic heart disease of native coronary artery without angina pectoris: Secondary | ICD-10-CM | POA: Diagnosis not present

## 2022-01-03 DIAGNOSIS — J440 Chronic obstructive pulmonary disease with acute lower respiratory infection: Secondary | ICD-10-CM | POA: Diagnosis not present

## 2022-01-03 DIAGNOSIS — M76899 Other specified enthesopathies of unspecified lower limb, excluding foot: Secondary | ICD-10-CM | POA: Diagnosis not present

## 2022-01-03 DIAGNOSIS — I129 Hypertensive chronic kidney disease with stage 1 through stage 4 chronic kidney disease, or unspecified chronic kidney disease: Secondary | ICD-10-CM | POA: Diagnosis not present

## 2022-01-03 DIAGNOSIS — M5136 Other intervertebral disc degeneration, lumbar region: Secondary | ICD-10-CM | POA: Diagnosis not present

## 2022-01-03 DIAGNOSIS — E782 Mixed hyperlipidemia: Secondary | ICD-10-CM | POA: Diagnosis not present

## 2022-01-03 DIAGNOSIS — N390 Urinary tract infection, site not specified: Secondary | ICD-10-CM | POA: Diagnosis not present

## 2022-01-03 DIAGNOSIS — F0280 Dementia in other diseases classified elsewhere without behavioral disturbance: Secondary | ICD-10-CM | POA: Diagnosis not present

## 2022-01-03 DIAGNOSIS — M48061 Spinal stenosis, lumbar region without neurogenic claudication: Secondary | ICD-10-CM | POA: Diagnosis not present

## 2022-01-03 DIAGNOSIS — E1122 Type 2 diabetes mellitus with diabetic chronic kidney disease: Secondary | ICD-10-CM | POA: Diagnosis not present

## 2022-01-03 DIAGNOSIS — K219 Gastro-esophageal reflux disease without esophagitis: Secondary | ICD-10-CM | POA: Diagnosis not present

## 2022-01-03 DIAGNOSIS — B961 Klebsiella pneumoniae [K. pneumoniae] as the cause of diseases classified elsewhere: Secondary | ICD-10-CM | POA: Diagnosis not present

## 2022-01-03 DIAGNOSIS — Z9181 History of falling: Secondary | ICD-10-CM | POA: Diagnosis not present

## 2022-01-03 DIAGNOSIS — J13 Pneumonia due to Streptococcus pneumoniae: Secondary | ICD-10-CM | POA: Diagnosis not present

## 2022-01-03 DIAGNOSIS — I10 Essential (primary) hypertension: Secondary | ICD-10-CM | POA: Diagnosis not present

## 2022-01-03 DIAGNOSIS — E039 Hypothyroidism, unspecified: Secondary | ICD-10-CM | POA: Diagnosis not present

## 2022-01-03 DIAGNOSIS — H409 Unspecified glaucoma: Secondary | ICD-10-CM | POA: Diagnosis not present

## 2022-01-03 DIAGNOSIS — K589 Irritable bowel syndrome without diarrhea: Secondary | ICD-10-CM | POA: Diagnosis not present

## 2022-01-03 DIAGNOSIS — M1990 Unspecified osteoarthritis, unspecified site: Secondary | ICD-10-CM | POA: Diagnosis not present

## 2022-01-03 DIAGNOSIS — J9601 Acute respiratory failure with hypoxia: Secondary | ICD-10-CM | POA: Diagnosis not present

## 2022-01-03 DIAGNOSIS — N183 Chronic kidney disease, stage 3 unspecified: Secondary | ICD-10-CM | POA: Diagnosis not present

## 2022-01-03 DIAGNOSIS — E663 Overweight: Secondary | ICD-10-CM | POA: Diagnosis not present

## 2022-01-15 DIAGNOSIS — J13 Pneumonia due to Streptococcus pneumoniae: Secondary | ICD-10-CM | POA: Diagnosis not present

## 2022-01-15 DIAGNOSIS — J440 Chronic obstructive pulmonary disease with acute lower respiratory infection: Secondary | ICD-10-CM | POA: Diagnosis not present

## 2022-01-15 DIAGNOSIS — E1122 Type 2 diabetes mellitus with diabetic chronic kidney disease: Secondary | ICD-10-CM | POA: Diagnosis not present

## 2022-01-15 DIAGNOSIS — B961 Klebsiella pneumoniae [K. pneumoniae] as the cause of diseases classified elsewhere: Secondary | ICD-10-CM | POA: Diagnosis not present

## 2022-01-15 DIAGNOSIS — N390 Urinary tract infection, site not specified: Secondary | ICD-10-CM | POA: Diagnosis not present

## 2022-01-15 DIAGNOSIS — I129 Hypertensive chronic kidney disease with stage 1 through stage 4 chronic kidney disease, or unspecified chronic kidney disease: Secondary | ICD-10-CM | POA: Diagnosis not present

## 2022-01-15 DIAGNOSIS — J9601 Acute respiratory failure with hypoxia: Secondary | ICD-10-CM | POA: Diagnosis not present

## 2022-01-19 ENCOUNTER — Inpatient Hospital Stay
Admission: EM | Admit: 2022-01-19 | Discharge: 2022-01-24 | DRG: 291 | Disposition: A | Discharge status: Hospice | Payer: PPO | Attending: Internal Medicine | Admitting: Internal Medicine

## 2022-01-19 ENCOUNTER — Inpatient Hospital Stay (HOSPITAL_COMMUNITY)
Admit: 2022-01-19 | Discharge: 2022-01-19 | Disposition: A | Discharge status: Home / Self Care | Payer: PPO | Attending: Student | Admitting: Student

## 2022-01-19 ENCOUNTER — Emergency Department: Payer: PPO

## 2022-01-19 ENCOUNTER — Other Ambulatory Visit: Payer: Self-pay

## 2022-01-19 DIAGNOSIS — I130 Hypertensive heart and chronic kidney disease with heart failure and stage 1 through stage 4 chronic kidney disease, or unspecified chronic kidney disease: Principal | ICD-10-CM | POA: Diagnosis present

## 2022-01-19 DIAGNOSIS — J90 Pleural effusion, not elsewhere classified: Secondary | ICD-10-CM | POA: Diagnosis not present

## 2022-01-19 DIAGNOSIS — I959 Hypotension, unspecified: Secondary | ICD-10-CM | POA: Diagnosis not present

## 2022-01-19 DIAGNOSIS — I5033 Acute on chronic diastolic (congestive) heart failure: Secondary | ICD-10-CM | POA: Diagnosis present

## 2022-01-19 DIAGNOSIS — Z515 Encounter for palliative care: Secondary | ICD-10-CM

## 2022-01-19 DIAGNOSIS — B952 Enterococcus as the cause of diseases classified elsewhere: Secondary | ICD-10-CM | POA: Diagnosis present

## 2022-01-19 DIAGNOSIS — N3001 Acute cystitis with hematuria: Secondary | ICD-10-CM | POA: Diagnosis not present

## 2022-01-19 DIAGNOSIS — Z87440 Personal history of urinary (tract) infections: Secondary | ICD-10-CM | POA: Diagnosis not present

## 2022-01-19 DIAGNOSIS — J45909 Unspecified asthma, uncomplicated: Secondary | ICD-10-CM | POA: Diagnosis not present

## 2022-01-19 DIAGNOSIS — Z7982 Long term (current) use of aspirin: Secondary | ICD-10-CM

## 2022-01-19 DIAGNOSIS — L03115 Cellulitis of right lower limb: Secondary | ICD-10-CM | POA: Diagnosis present

## 2022-01-19 DIAGNOSIS — R531 Weakness: Secondary | ICD-10-CM | POA: Diagnosis not present

## 2022-01-19 DIAGNOSIS — R0602 Shortness of breath: Secondary | ICD-10-CM | POA: Diagnosis not present

## 2022-01-19 DIAGNOSIS — J9691 Respiratory failure, unspecified with hypoxia: Secondary | ICD-10-CM | POA: Diagnosis present

## 2022-01-19 DIAGNOSIS — J9601 Acute respiratory failure with hypoxia: Principal | ICD-10-CM

## 2022-01-19 DIAGNOSIS — R339 Retention of urine, unspecified: Secondary | ICD-10-CM | POA: Diagnosis not present

## 2022-01-19 DIAGNOSIS — E876 Hypokalemia: Secondary | ICD-10-CM | POA: Diagnosis not present

## 2022-01-19 DIAGNOSIS — R0902 Hypoxemia: Secondary | ICD-10-CM | POA: Diagnosis not present

## 2022-01-19 DIAGNOSIS — J9621 Acute and chronic respiratory failure with hypoxia: Secondary | ICD-10-CM | POA: Diagnosis not present

## 2022-01-19 DIAGNOSIS — Z79890 Hormone replacement therapy (postmenopausal): Secondary | ICD-10-CM

## 2022-01-19 DIAGNOSIS — N3090 Cystitis, unspecified without hematuria: Secondary | ICD-10-CM

## 2022-01-19 DIAGNOSIS — F05 Delirium due to known physiological condition: Secondary | ICD-10-CM | POA: Diagnosis not present

## 2022-01-19 DIAGNOSIS — E8770 Fluid overload, unspecified: Secondary | ICD-10-CM | POA: Diagnosis not present

## 2022-01-19 DIAGNOSIS — Z79899 Other long term (current) drug therapy: Secondary | ICD-10-CM

## 2022-01-19 DIAGNOSIS — Z885 Allergy status to narcotic agent status: Secondary | ICD-10-CM

## 2022-01-19 DIAGNOSIS — Z91040 Latex allergy status: Secondary | ICD-10-CM

## 2022-01-19 DIAGNOSIS — Z882 Allergy status to sulfonamides status: Secondary | ICD-10-CM

## 2022-01-19 DIAGNOSIS — E1122 Type 2 diabetes mellitus with diabetic chronic kidney disease: Secondary | ICD-10-CM | POA: Diagnosis present

## 2022-01-19 DIAGNOSIS — I700 Atherosclerosis of aorta: Secondary | ICD-10-CM | POA: Diagnosis not present

## 2022-01-19 DIAGNOSIS — L03119 Cellulitis of unspecified part of limb: Secondary | ICD-10-CM | POA: Diagnosis not present

## 2022-01-19 DIAGNOSIS — E039 Hypothyroidism, unspecified: Secondary | ICD-10-CM

## 2022-01-19 DIAGNOSIS — E785 Hyperlipidemia, unspecified: Secondary | ICD-10-CM | POA: Diagnosis not present

## 2022-01-19 DIAGNOSIS — R069 Unspecified abnormalities of breathing: Secondary | ICD-10-CM | POA: Diagnosis not present

## 2022-01-19 DIAGNOSIS — R4182 Altered mental status, unspecified: Secondary | ICD-10-CM | POA: Diagnosis not present

## 2022-01-19 DIAGNOSIS — R5381 Other malaise: Secondary | ICD-10-CM | POA: Diagnosis not present

## 2022-01-19 DIAGNOSIS — I77810 Thoracic aortic ectasia: Secondary | ICD-10-CM | POA: Diagnosis present

## 2022-01-19 DIAGNOSIS — E1165 Type 2 diabetes mellitus with hyperglycemia: Secondary | ICD-10-CM | POA: Diagnosis not present

## 2022-01-19 DIAGNOSIS — Z7984 Long term (current) use of oral hypoglycemic drugs: Secondary | ICD-10-CM

## 2022-01-19 DIAGNOSIS — R0689 Other abnormalities of breathing: Secondary | ICD-10-CM | POA: Diagnosis not present

## 2022-01-19 DIAGNOSIS — N183 Chronic kidney disease, stage 3 unspecified: Secondary | ICD-10-CM

## 2022-01-19 DIAGNOSIS — I5021 Acute systolic (congestive) heart failure: Secondary | ICD-10-CM | POA: Diagnosis not present

## 2022-01-19 DIAGNOSIS — L03116 Cellulitis of left lower limb: Secondary | ICD-10-CM | POA: Diagnosis not present

## 2022-01-19 DIAGNOSIS — F0390 Unspecified dementia without behavioral disturbance: Secondary | ICD-10-CM | POA: Diagnosis not present

## 2022-01-19 DIAGNOSIS — N1832 Chronic kidney disease, stage 3b: Secondary | ICD-10-CM | POA: Diagnosis present

## 2022-01-19 DIAGNOSIS — N3000 Acute cystitis without hematuria: Secondary | ICD-10-CM | POA: Diagnosis not present

## 2022-01-19 DIAGNOSIS — K5730 Diverticulosis of large intestine without perforation or abscess without bleeding: Secondary | ICD-10-CM | POA: Diagnosis not present

## 2022-01-19 DIAGNOSIS — Z7189 Other specified counseling: Secondary | ICD-10-CM | POA: Diagnosis not present

## 2022-01-19 DIAGNOSIS — L0390 Cellulitis, unspecified: Secondary | ICD-10-CM

## 2022-01-19 DIAGNOSIS — R410 Disorientation, unspecified: Secondary | ICD-10-CM | POA: Diagnosis not present

## 2022-01-19 DIAGNOSIS — Z794 Long term (current) use of insulin: Secondary | ICD-10-CM

## 2022-01-19 DIAGNOSIS — Z993 Dependence on wheelchair: Secondary | ICD-10-CM

## 2022-01-19 DIAGNOSIS — T502X5A Adverse effect of carbonic-anhydrase inhibitors, benzothiadiazides and other diuretics, initial encounter: Secondary | ICD-10-CM | POA: Diagnosis not present

## 2022-01-19 DIAGNOSIS — Z85828 Personal history of other malignant neoplasm of skin: Secondary | ICD-10-CM

## 2022-01-19 DIAGNOSIS — N390 Urinary tract infection, site not specified: Secondary | ICD-10-CM | POA: Diagnosis present

## 2022-01-19 LAB — CBC WITH DIFFERENTIAL/PLATELET
Abs Immature Granulocytes: 0.03 10*3/uL (ref 0.00–0.07)
Basophils Absolute: 0 10*3/uL (ref 0.0–0.1)
Basophils Relative: 1 %
Eosinophils Absolute: 0.2 10*3/uL (ref 0.0–0.5)
Eosinophils Relative: 2 %
HCT: 39.9 % (ref 36.0–46.0)
Hemoglobin: 12 g/dL (ref 12.0–15.0)
Immature Granulocytes: 0 %
Lymphocytes Relative: 23 %
Lymphs Abs: 2 10*3/uL (ref 0.7–4.0)
MCH: 29.9 pg (ref 26.0–34.0)
MCHC: 30.1 g/dL (ref 30.0–36.0)
MCV: 99.3 fL (ref 80.0–100.0)
Monocytes Absolute: 0.8 10*3/uL (ref 0.1–1.0)
Monocytes Relative: 9 %
Neutro Abs: 5.8 10*3/uL (ref 1.7–7.7)
Neutrophils Relative %: 65 %
Platelets: 203 10*3/uL (ref 150–400)
RBC: 4.02 MIL/uL (ref 3.87–5.11)
RDW: 15.4 % (ref 11.5–15.5)
WBC: 8.9 10*3/uL (ref 4.0–10.5)
nRBC: 0 % (ref 0.0–0.2)

## 2022-01-19 LAB — URINALYSIS, ROUTINE W REFLEX MICROSCOPIC
Bilirubin Urine: NEGATIVE
Glucose, UA: NEGATIVE mg/dL
Hgb urine dipstick: NEGATIVE
Ketones, ur: NEGATIVE mg/dL
Nitrite: NEGATIVE
Protein, ur: 30 mg/dL — AB
Specific Gravity, Urine: 1.01 (ref 1.005–1.030)
WBC, UA: >50 WBC/hpf — ABNORMAL HIGH (ref 0–5)
pH: 7 (ref 5.0–8.0)

## 2022-01-19 LAB — COMPREHENSIVE METABOLIC PANEL
ALT: 14 U/L (ref 0–44)
AST: 19 U/L (ref 15–41)
Albumin: 3.1 g/dL — ABNORMAL LOW (ref 3.5–5.0)
Alkaline Phosphatase: 61 U/L (ref 38–126)
Anion gap: 10 (ref 5–15)
BUN: 25 mg/dL — ABNORMAL HIGH (ref 8–23)
CO2: 31 mmol/L (ref 22–32)
Calcium: 8.3 mg/dL — ABNORMAL LOW (ref 8.9–10.3)
Chloride: 100 mmol/L (ref 98–111)
Creatinine, Ser: 1.44 mg/dL — ABNORMAL HIGH (ref 0.44–1.00)
GFR, Estimated: 33 mL/min — ABNORMAL LOW (ref >60)
Glucose, Bld: 156 mg/dL — ABNORMAL HIGH (ref 70–99)
Potassium: 4.2 mmol/L (ref 3.5–5.1)
Sodium: 141 mmol/L (ref 135–145)
Total Bilirubin: 0.5 mg/dL (ref 0.3–1.2)
Total Protein: 6.9 g/dL (ref 6.5–8.1)

## 2022-01-19 LAB — RESPIRATORY PANEL BY PCR

## 2022-01-19 LAB — PROTIME-INR
INR: 1.2 (ref 0.8–1.2)
Prothrombin Time: 15 seconds (ref 11.4–15.2)

## 2022-01-19 LAB — BRAIN NATRIURETIC PEPTIDE: B Natriuretic Peptide: 287.2 pg/mL — ABNORMAL HIGH (ref 0.0–100.0)

## 2022-01-19 LAB — GLUCOSE, CAPILLARY
Glucose-Capillary: 151 mg/dL — ABNORMAL HIGH (ref 70–99)
Glucose-Capillary: 113 mg/dL — ABNORMAL HIGH (ref 70–99)

## 2022-01-19 LAB — LACTIC ACID, PLASMA: Lactic Acid, Venous: 1.7 mmol/L (ref 0.5–1.9)

## 2022-01-19 MED ORDER — SODIUM CHLORIDE 0.9 % IV SOLN
1.0000 g | Freq: Once | INTRAVENOUS | Status: AC
  Administered 2022-01-19: 1 g via INTRAVENOUS
  Filled 2022-01-19: qty 10

## 2022-01-19 MED ORDER — ASPIRIN EC 81 MG PO TBEC
81.0000 mg | DELAYED_RELEASE_TABLET | Freq: Every day | ORAL | Status: DC
  Administered 2022-01-19 – 2022-01-24 (×6): 81 mg via ORAL
  Filled 2022-01-19 (×6): qty 1

## 2022-01-19 MED ORDER — GABAPENTIN 100 MG PO CAPS
200.0000 mg | ORAL_CAPSULE | Freq: Three times a day (TID) | ORAL | Status: DC
  Administered 2022-01-19 – 2022-01-24 (×14): 200 mg via ORAL
  Filled 2022-01-19 (×15): qty 2

## 2022-01-19 MED ORDER — GLUCERNA SHAKE PO LIQD
237.0000 mL | Freq: Three times a day (TID) | ORAL | Status: DC
Start: 2022-01-19 — End: 2022-01-24
  Administered 2022-01-19 – 2022-01-24 (×9): 237 mL via ORAL

## 2022-01-19 MED ORDER — SODIUM CHLORIDE 0.9% FLUSH
3.0000 mL | Freq: Two times a day (BID) | INTRAVENOUS | Status: DC
  Administered 2022-01-19 – 2022-01-23 (×8): 3 mL via INTRAVENOUS

## 2022-01-19 MED ORDER — BISACODYL 10 MG RE SUPP
10.0000 mg | Freq: Every day | RECTAL | Status: DC | PRN
  Filled 2022-01-19: qty 1

## 2022-01-19 MED ORDER — ONDANSETRON HCL 4 MG/2ML IJ SOLN
4.0000 mg | Freq: Four times a day (QID) | INTRAMUSCULAR | Status: DC | PRN
  Administered 2022-01-23: 4 mg via INTRAVENOUS
  Filled 2022-01-19: qty 2

## 2022-01-19 MED ORDER — FUROSEMIDE 10 MG/ML IJ SOLN
20.0000 mg | Freq: Two times a day (BID) | INTRAMUSCULAR | Status: DC
  Administered 2022-01-20 (×2): 20 mg via INTRAVENOUS
  Filled 2022-01-19 (×2): qty 2

## 2022-01-19 MED ORDER — SODIUM CHLORIDE 0.9 % IV SOLN
2.0000 g | Freq: Once | INTRAVENOUS | Status: AC
  Administered 2022-01-19: 2 g via INTRAVENOUS
  Filled 2022-01-19: qty 2

## 2022-01-19 MED ORDER — SODIUM CHLORIDE 0.9 % IV SOLN
2.0000 g | INTRAVENOUS | Status: DC
  Filled 2022-01-19 (×2): qty 2

## 2022-01-19 MED ORDER — SODIUM CHLORIDE 0.9% FLUSH: 3.0000 mL | INTRAVENOUS | Status: DC | PRN

## 2022-01-19 MED ORDER — SODIUM CHLORIDE 0.9 % IV SOLN: 1.0000 g | INTRAVENOUS | Status: DC

## 2022-01-19 MED ORDER — VANCOMYCIN HCL IN DEXTROSE 1-5 GM/200ML-% IV SOLN: 1000.0000 mg | Freq: Once | INTRAVENOUS | Status: DC

## 2022-01-19 MED ORDER — ACETAMINOPHEN 325 MG PO TABS
650.0000 mg | ORAL_TABLET | Freq: Four times a day (QID) | ORAL | Status: DC | PRN
  Administered 2022-01-19: 650 mg via ORAL
  Filled 2022-01-19: qty 2

## 2022-01-19 MED ORDER — INSULIN ASPART 100 UNIT/ML IJ SOLN
0.0000 [IU] | Freq: Three times a day (TID) | INTRAMUSCULAR | Status: DC
  Administered 2022-01-19 – 2022-01-20 (×2): 3 [IU] via SUBCUTANEOUS
  Administered 2022-01-21 (×2): 2 [IU] via SUBCUTANEOUS
  Administered 2022-01-21 – 2022-01-22 (×2): 3 [IU] via SUBCUTANEOUS
  Administered 2022-01-22: 5 [IU] via SUBCUTANEOUS
  Administered 2022-01-22: 2 [IU] via SUBCUTANEOUS
  Administered 2022-01-23 – 2022-01-24 (×5): 3 [IU] via SUBCUTANEOUS
  Filled 2022-01-19 (×12): qty 1

## 2022-01-19 MED ORDER — FUROSEMIDE 10 MG/ML IJ SOLN
40.0000 mg | Freq: Once | INTRAMUSCULAR | Status: AC
  Administered 2022-01-19: 40 mg via INTRAVENOUS
  Filled 2022-01-19: qty 4

## 2022-01-19 MED ORDER — LEVOTHYROXINE SODIUM 50 MCG PO TABS
50.0000 ug | ORAL_TABLET | Freq: Every day | ORAL | Status: DC
  Administered 2022-01-20 – 2022-01-24 (×5): 50 ug via ORAL
  Filled 2022-01-19 (×5): qty 1

## 2022-01-19 MED ORDER — ACETAMINOPHEN 650 MG RE SUPP: 650.0000 mg | Freq: Four times a day (QID) | RECTAL | Status: DC | PRN

## 2022-01-19 MED ORDER — ONDANSETRON HCL 4 MG PO TABS: 4.0000 mg | ORAL_TABLET | Freq: Four times a day (QID) | ORAL | Status: DC | PRN

## 2022-01-19 MED ORDER — ENOXAPARIN SODIUM 30 MG/0.3ML IJ SOSY
30.0000 mg | PREFILLED_SYRINGE | INTRAMUSCULAR | Status: DC
  Administered 2022-01-19 – 2022-01-21 (×3): 30 mg via SUBCUTANEOUS
  Filled 2022-01-19 (×3): qty 0.3

## 2022-01-19 MED ORDER — OXYCODONE HCL 5 MG PO TABS
5.0000 mg | ORAL_TABLET | ORAL | Status: DC | PRN
  Administered 2022-01-21: 5 mg via ORAL
  Filled 2022-01-19: qty 1

## 2022-01-19 MED ORDER — SODIUM CHLORIDE 0.9 % IV SOLN
250.0000 mL | INTRAVENOUS | Status: DC | PRN
  Administered 2022-01-19: 250 mL via INTRAVENOUS

## 2022-01-19 MED ORDER — VANCOMYCIN HCL 1500 MG/300ML IV SOLN
1500.0000 mg | INTRAVENOUS | Status: DC
  Administered 2022-01-19: 1500 mg via INTRAVENOUS
  Filled 2022-01-19 (×2): qty 300

## 2022-01-19 NOTE — ED Notes (Signed)
Pt provided meal tray at this time from dietary. ?

## 2022-01-19 NOTE — ED Triage Notes (Signed)
Patient to ER from home via [facility] with [facility]. Family called 911 because they checked patient's spo2 and got a low reading. Fire responded initially and patient's room air sats were 76. Placed on NRB with improvement of sats to 96%. Patient currently on 3L via [place], satting at 100%.  ? ?Patient's family report she may also have a UTI. Patient complaining of pain all over. Bilateral leg swelling and redness. Patient needed maximum assist into wheelchair by EMS.  ? ?Ems VS- Cbg 183. 126/53. 101 axillary temp  ?

## 2022-01-19 NOTE — ED Notes (Signed)
Pt remains on 3 liter's oxygen from triage, acute use. Pt c/o having to "wee." Pt is alert, oriented to self only, son states this is baseline cognition for her. 1+ pitting edema noted in lower legs bilaterally.  ?

## 2022-01-19 NOTE — ED Notes (Signed)
Pt brief changed of urine and placed on purewick catheter.  ?

## 2022-01-19 NOTE — ED Provider Notes (Signed)
? ?[facility] ?Provider Note ? ? ? Event Date/Time  ? First MD Initiated Contact with Patient 01/19/22 1157   ?  (approximate) ? ? ?History  ? ?Shortness of Breath ? ? ?HPI ? ?Crystal Delacruz is a 88 y.o. female presents to the ER for generalized malaise fatigue weakness unable to stand having worsening lower extremity swelling having increasing dysuria family worried that she has a UTI.  EMS was called due to her significant weakness was found to be hypoxic requiring nonrebreather now currently on 3 L nasal cannula.  Denies any history of congestive heart failure.  No cough or congestion. ?  ? ? ?Physical Exam  ? ?Triage Vital Signs: ?ED Triage Vitals  ?Enc Vitals Group  ?   BP 01/19/22 1148 (!) 102/55  ?   Pulse Rate 01/19/22 1148 78  ?   Resp 01/19/22 1148 (!) 22  ?   Temp 01/19/22 1148 98.6 ?F (37 ?C)  ?   Temp Source 01/19/22 1148 Oral  ?   SpO2 01/19/22 1148 100 %  ?   Weight --   ?   Height 01/19/22 1149 '5\' 7"'$  (1.702 m)  ?   Head Circumference --   ?   Peak Flow --   ?   Pain Score --   ?   Pain Loc --   ?   Pain Edu? --   ?   Excl. in [place]? --   ? ? ?Most recent vital signs: ?Vitals:  ? 01/19/22 1400 01/19/22 1425  ?BP:  (!) 111/47  ?Pulse: 74 73  ?Resp: 15 17  ?Temp:    ?SpO2: 98% 94%  ? ? ? ?Constitutional: Alert  ?Eyes: Conjunctivae are normal.  ?Head: Atraumatic. ?Nose: No congestion/rhinnorhea. ?Mouth/Throat: Mucous membranes are moist.   ?Neck: Painless ROM.  ?Cardiovascular:   Good peripheral circulation. ?Respiratory: Normal respiratory effort.  No retractions. Bibasilar crackles ?Gastrointestinal: Soft and nontender.  ?Musculoskeletal:  no deformity  2+ ble edema ?Neurologic:  MAE spontaneously. No gross focal neurologic deficits are appreciated.  ?Skin:  Skin is warm, dry and intact. No rash noted. ?Psychiatric: Mood and affect are normal. Speech and behavior are normal. ? ? ? ?ED Results / Procedures / Treatments  ? ?Labs ?(all labs ordered are listed, but only abnormal results  are displayed) ?Labs Reviewed  ?COMPREHENSIVE METABOLIC PANEL - Abnormal; Notable for the following components:  ?    Result Value  ? Glucose, Bld 156 (*)   ? BUN 25 (*)   ? Creatinine, Ser 1.44 (*)   ? Calcium 8.3 (*)   ? Albumin 3.1 (*)   ? GFR, Estimated 33 (*)   ? All other components within normal limits  ?URINALYSIS, ROUTINE W REFLEX MICROSCOPIC - Abnormal; Notable for the following components:  ? Color, Urine YELLOW (*)   ? APPearance TURBID (*)   ? Protein, ur 30 (*)   ? Leukocytes,Ua LARGE (*)   ? WBC, UA >50 (*)   ? Bacteria, UA FEW (*)   ? All other components within normal limits  ?BRAIN NATRIURETIC PEPTIDE - Abnormal; Notable for the following components:  ? B Natriuretic Peptide 287.2 (*)   ? All other components within normal limits  ?CULTURE, BLOOD (ROUTINE X 2)  ?CULTURE, BLOOD (ROUTINE X 2)  ?LACTIC ACID, PLASMA  ?CBC WITH DIFFERENTIAL/PLATELET  ?PROTIME-INR  ? ? ? ?EKG ? ?ED ECG REPORT ?I, Merlyn Lot, the attending physician, personally viewed and interpreted this ECG. ? ?  Date: 01/19/2022 ? EKG Time: 11:50 ? Rate: 75 ? Rhythm: sinus ? Axis: normal ? Intervals: normal ? ST&T Change: no stemi ? ? ? ?RADIOLOGY ?Please see ED Course for my review and interpretation. ? ?I personally reviewed all radiographic images ordered to evaluate for the above acute complaints and reviewed radiology reports and findings.  These findings were personally discussed with the patient.  Please see medical record for radiology report. ? ? ? ?PROCEDURES: ? ?Critical Care performed: yes ? ?.Critical Care ?Performed by: Merlyn Lot, MD ?Authorized by: Merlyn Lot, MD  ? ?Critical care provider statement:  ?  Critical care time (minutes):  35 ?  Critical care was necessary to treat or prevent imminent or life-threatening deterioration of the following conditions:  Respiratory failure ?  Critical care was time spent personally by me on the following activities:  Ordering and performing treatments and  interventions, ordering and review of laboratory studies, ordering and review of radiographic studies, pulse oximetry, re-evaluation of patient's condition, review of old charts, obtaining history from patient or surrogate, examination of patient, evaluation of patient's response to treatment, discussions with primary provider, discussions with consultants and development of treatment plan with patient or surrogate ? ? ?MEDICATIONS ORDERED IN ED: ?Medications  ?cefTRIAXone (ROCEPHIN) 1 g in sodium chloride 0.9 % 100 mL IVPB (0 g Intravenous Stopped 01/19/22 1405)  ?furosemide (LASIX) injection 40 mg (40 mg Intravenous Given 01/19/22 1438)  ? ? ? ?IMPRESSION / MDM / ASSESSMENT AND PLAN / ED COURSE  ?I reviewed the triage vital signs and the nursing notes. ?             ?               ? ?Differential diagnosis includes, but is not limited to, UTI, cystitis, stone, pyelo-, sepsis, anemia, CHF, pneumonia, COPD ? ?Patient presenting with symptoms as described above.  Has significant edema on exam also reporting dysuria blood work sent for the but differential she does have acute hypoxic respiratory failure requiring supplemental oxygen.  CT imaging ordered does not show any evidence of nephrolithiasis does show urinary retention.  Urinalysis is consistent with acute cystitis.  BNP is elevated we will give IV Lasix.  Have given IV antibiotics.  Will consult hospitalist for admission. ? ? ?Clinical Course as of 01/19/22 1442  ?Fri Jan 19, 2022  ?1441 Case discussed in consultation with hospitalist who agrees admit patient to their service. [PR]  ?  ?Clinical Course User Index ?[PR] Merlyn Lot, MD  ? ? ? ?FINAL CLINICAL IMPRESSION(S) / ED DIAGNOSES  ? ?Final diagnoses:  ?Acute respiratory failure with hypoxemia ([place])  ?Cystitis  ?Urinary retention  ? ? ? ?Rx / DC Orders  ? ?ED Discharge Orders   ? ? None  ? ?  ? ? ? ?Note:  This document was prepared using Dragon voice recognition software and may include  unintentional dictation errors. ? ?  ?Merlyn Lot, MD ?01/19/22 1442 ? ?

## 2022-01-19 NOTE — ED Notes (Signed)
Pt repositioned in bed for comfort.

## 2022-01-19 NOTE — ED Notes (Signed)
Due meds pending verification. Pt taken to inpatient room at this time.  ?

## 2022-01-19 NOTE — ED Notes (Signed)
Pt sleeping at this time, son and husband remain at bedside.  ?

## 2022-01-19 NOTE — ED Notes (Signed)
Informed RN bed assigned 

## 2022-01-19 NOTE — Consult Note (Signed)
Pharmacy Antibiotic Note ? ?Crystal Delacruz is a 88 y.o. female admitted on 01/19/2022 with cellulitis.  Pharmacy has been consulted for Cefepime and Vancomycin dosing. ? ?Plan: ?Cefepime 2gm q 24 hours ? ?2. Vancomycin '1500mg'$  q 48hrs ?Goal AUC 400-550. ?Expected AUC: 540 ?SCr used: 1.44 ? ?Height: '5\' 7"'$  (170.2 cm) ?Weight: 82.8 kg (182 lb 8.7 oz) ?IBW/kg (Calculated) : 61.6 ? ?Temp (24hrs), Avg:98.6 ?F (37 ?C), Min:98.6 ?F (37 ?C), Max:98.6 ?F (37 ?C) ? ?Recent Labs  ?Lab 01/19/22 ?1151  ?WBC 8.9  ?CREATININE 1.44*  ?LATICACIDVEN 1.7  ?  ?Estimated Creatinine Clearance: 25.9 mL/min (A) (by C-G formula based on SCr of 1.44 mg/dL (H)).   ? ?Allergies  ?Allergen Reactions  ? Elemental Sulfur   ? Latex Swelling  ? Morphine And Related   ? ? ?Antimicrobials this admission: ?Ceftriaxone 3/17 x1 ?Cefepime 3/17 >>  ?Vancomycin 3/17 >>  ? ?Dose adjustments this admission: none ? ?Microbiology results: ?3/17 BCx: sent ?3/17 UCx: sent  ? ?Thank you for allowing pharmacy to be a part of this patient?s care. ? ?Shyler Holzman Rodriguez-Guzman PharmD, BCPS ?01/19/2022 4:45 PM ? ?

## 2022-01-19 NOTE — H&P (Signed)
[facility] ?History and Physical ? ? ?Patient: Crystal Delacruz RXV:[id number]   ?PCP: Kirk Ruths, MD DOB: [birth year]   ?DOA: 01/19/2022   DOS: 01/19/2022   ?DOS: the patient was seen and examined on 01/19/2022 ? ?Patient coming from: The patient is coming from Home ? ?Chief Complaint: Respiratory failure ? ?HPI: Crystal Delacruz is a 88 y.o. female with Past medical history off HTN, HLD, IDDM T2, hypothyroid, dementia AO x1, bilateral lower extremity edema, wheelchair-bound, as reviewed from EMR, presented to [facility] ED with hypoxic respiratory failure.  Family called EMS, patient was found to have oxygen saturation in 70s, patient was briefly placed on nonrebreather, currently saturating well on 3 L oxygen.  ?Patient has dementia AAO x1 which is her baseline, HPI reviewed from EMR and got information from patient's family at bedside. ? ?ED Course: Tachypneic, respiratory failure on 3 L oxygen currently, mild hypertension. ?BMP mild hyperglycemia glucose 156, creatinine 1.44 at baseline CKD stage III ?BNP 287, lactic acid 1.7 within normal range WBC count 8.9 within normal range ?UA positive, urine culture and blood culture collected. ?CXR No significant change in patchy pulmonary opacities at both lung bases, favored to reflect scarring based on relative stability. No new airspace disease ?Ct stone: 1. Consolidative densities at the right lung base which may represent infiltrate and/or atelectasis, correlate clinically.  ?2. Single small focus of air in the urinary bladder which could be seen with infection, correlate with urinalysis. ?3. No nephrolithiasis or obstructive uropathy. ?4. Mild colonic diverticulosis. ?5. 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 Recommend annual imaging followup by CTA or MRA. This recommendation follows 2010 ? ? ?Review of Systems: as mentioned in the history of present illness.  ?All other systems reviewed and are negative. ? ?Past Medical History:   ?Diagnosis Date  ? Arthritis   ? Asthma   ? Diabetes mellitus without complication ([place])   ? Hypertension   ? Hypothyroidism   ? Squamous cell carcinoma of skin 09/29/2018  ? L lower leg  ? Squamous cell carcinoma of skin 01/07/2020  ? R mid dorsum forearm  ? Squamous cell carcinoma of skin 12/19/2020  ? Left forearm (in situ), EDC  ? Squamous cell carcinoma of skin 06/21/2021  ? right bicep, EDC  ? Squamous cell carcinoma of skin 06/21/2021  ? right mid dorsum forearm, EDC  ? ?Past Surgical History:  ?Procedure Laterality Date  ? none    ? ?Social History:  reports that she has never smoked. She has never used smokeless tobacco. She reports that she does not drink alcohol and does not use drugs. ? ?Allergies  ?Allergen Reactions  ? Elemental Sulfur   ? Latex Swelling  ? Morphine And Related   ? ? ? ?Family history reviewed and not pertinent ?No family history on file. ? ? ?Prior to Admission medications   ?Medication Sig Start Date End Date Taking? Authorizing Provider  ?metoprolol succinate (TOPROL-XL) 25 MG 24 hr tablet Take 1 tablet by mouth daily. 01/03/22  Yes [provider]  ?acetaminophen (TYLENOL) 500 MG tablet Take 500 mg by mouth every 6 (six) hours as needed.    [provider]  ?aspirin EC 81 MG tablet Take 81 mg by mouth daily.    [provider]  ?gabapentin (NEURONTIN) 100 MG capsule Take 200 mg by mouth 3 (three) times daily. 07/31/18   [provider]  ?glimepiride (AMARYL) 4 MG tablet Take 4 mg by mouth in the  morning and at bedtime. 10/20/21   [provider]  ?LANTUS SOLOSTAR 100 UNIT/ML Solostar Pen Inject 15 Units into the skin daily. 09/07/21   [provider]  ?levothyroxine (SYNTHROID, LEVOTHROID) 50 MCG tablet Take 50 mcg by mouth daily. 08/08/18   [provider]  ?metoprolol succinate (TOPROL-XL) 50 MG 24 hr tablet Take 50 mg by mouth 2 (two) times daily. 09/14/21   [provider]  ?nortriptyline (PAMELOR) 25 MG  capsule Take 25 mg by mouth 2 (two) times daily. 10/13/18   [provider]  ?QUEtiapine (SEROQUEL) 25 MG tablet Take 1 tablet by mouth in the morning and at bedtime. 12/09/21   [provider]  ?torsemide (DEMADEX) 20 MG tablet Take 40 mg by mouth daily. 09/05/18   [provider]  ? ? ?Physical Exam: ?Vitals:  ? 01/19/22 1300 01/19/22 1315 01/19/22 1400 01/19/22 1425  ?BP: (!) 125/53   (!) 111/47  ?Pulse: 76 78 74 73  ?Resp: '17 17 15 17  '$ ?Temp:      ?TempSrc:      ?SpO2: 98% 98% 98% 94%  ?Height:      ? ? ?General: alert and oriented to herself and knows that she is in the hospital. Appear in  ?no distress, affect appropriate ?Eyes: PERRLA, Conjunctiva normal ?ENT: Oral Mucosa Clear, moist  ?Neck: no JVD, no Abnormal Mass Or lumps ?Cardiovascular: S1 and S2 Present, no Murmur, peripheral pulses symmetrical ?Respiratory: good respiratory effort, Bilateral Air entry equal and Decreased, no signs of accessory muscle use, b/l Crackles, no wheezes ?Abdomen: Bowel Sound present, Soft and no tenderness, no hernia ?Skin: no rashes  ?Extremities: 3-4+ Pedal edema, with erythema, minimal tenderness, no calf tenderness ?Neurologic: without any new focal findings ?Gait not checked due to patient safety concerns ? ?Data Reviewed: ?I have personally reviewed and interpreted labs, imaging as discussed below. ? ?CBC: ?Recent Labs  ?Lab 01/19/22 ?1151  ?WBC 8.9  ?NEUTROABS 5.8  ?HGB 12.0  ?HCT 39.9  ?MCV 99.3  ?PLT 203  ? ?Basic Metabolic Panel: ?Recent Labs  ?Lab 01/19/22 ?1151  ?NA 141  ?K 4.2  ?CL 100  ?CO2 31  ?GLUCOSE 156*  ?BUN 25*  ?CREATININE 1.44*  ?CALCIUM 8.3*  ? ?GFR: ?CrCl cannot be calculated (Unknown ideal weight.). ?Liver Function Tests: ?Recent Labs  ?Lab 01/19/22 ?1151  ?AST 19  ?ALT 14  ?ALKPHOS 61  ?BILITOT 0.5  ?PROT 6.9  ?ALBUMIN 3.1*  ? ?No results for input(s): LIPASE, AMYLASE in the last 168 hours. ?No results for input(s): AMMONIA in the last 168 hours. ?Coagulation Profile: ?Recent  Labs  ?Lab 01/19/22 ?1151  ?INR 1.2  ? ?Cardiac Enzymes: ?No results for input(s): CKTOTAL, CKMB, CKMBINDEX, TROPONINI in the last 168 hours. ?BNP (last 3 results) ?No results for input(s): PROBNP in the last 8760 hours. ?HbA1C: ?No results for input(s): HGBA1C in the last 72 hours. ?CBG: ?No results for input(s): GLUCAP in the last 168 hours. ?Lipid Profile: ?No results for input(s): CHOL, HDL, LDLCALC, TRIG, CHOLHDL, LDLDIRECT in the last 72 hours. ?Thyroid Function Tests: ?No results for input(s): TSH, T4TOTAL, FREET4, T3FREE, THYROIDAB in the last 72 hours. ?Anemia Panel: ?No results for input(s): VITAMINB12, FOLATE, FERRITIN, TIBC, IRON, RETICCTPCT in the last 72 hours. ?Urine analysis: ?   ?Component Value Date/Time  ? COLORURINE YELLOW (A) 01/19/2022 1151  ? APPEARANCEUR TURBID (A) 01/19/2022 1151  ? APPEARANCEUR Turbid 09/10/2014 1250  ? LABSPEC 1.010 01/19/2022 1151  ? LABSPEC 1.013 09/10/2014 1250  ?  PHURINE 7.0 01/19/2022 1151  ? GLUCOSEU NEGATIVE 01/19/2022 1151  ? GLUCOSEU Negative 09/10/2014 1250  ? HGBUR NEGATIVE 01/19/2022 1151  ? [place] NEGATIVE 01/19/2022 1151  ? BILIRUBINUR Negative 09/10/2014 1250  ? [place] NEGATIVE 01/19/2022 1151  ? PROTEINUR 30 (A) 01/19/2022 1151  ? NITRITE NEGATIVE 01/19/2022 1151  ? LEUKOCYTESUR LARGE (A) 01/19/2022 1151  ? LEUKOCYTESUR 3+ 09/10/2014 1250  ? ? ?Radiological Exams on Admission: ?DG Chest 2 View ? ?Result Date: 01/19/2022 ?CLINICAL DATA:  Suspected sepsis.  Shortness of breath. EXAM: CHEST - 2 VIEW COMPARISON:  Radiographs 12/06/2021 and 12/05/2021.  CT 12/05/2021. FINDINGS: The frontal examination was repeated. The heart size and mediastinal contours are stable with aortic atherosclerosis. There are low lung volumes with unchanged patchy opacities at both lung bases, favored to reflect scarring. Trace bilateral pleural effusions. No progressive airspace disease or pneumothorax. There are degenerative changes throughout the spine. IMPRESSION: No  significant change in patchy pulmonary opacities at both lung bases, favored to reflect scarring based on relative stability. No new airspace disease. Electronically Signed   By: Richardean Sale M.D.   On: 01/19/2022 13

## 2022-01-20 DIAGNOSIS — J9601 Acute respiratory failure with hypoxia: Secondary | ICD-10-CM

## 2022-01-20 LAB — GLUCOSE, CAPILLARY
Glucose-Capillary: 52 mg/dL — ABNORMAL LOW (ref 70–99)
Glucose-Capillary: 70 mg/dL (ref 70–99)
Glucose-Capillary: 72 mg/dL (ref 70–99)
Glucose-Capillary: 107 mg/dL — ABNORMAL HIGH (ref 70–99)
Glucose-Capillary: 169 mg/dL — ABNORMAL HIGH (ref 70–99)
Glucose-Capillary: 137 mg/dL — ABNORMAL HIGH (ref 70–99)

## 2022-01-20 LAB — CBC
HCT: 38.1 % (ref 36.0–46.0)
Hemoglobin: 11.7 g/dL — ABNORMAL LOW (ref 12.0–15.0)
MCH: 30.4 pg (ref 26.0–34.0)
MCHC: 30.7 g/dL (ref 30.0–36.0)
MCV: 99 fL (ref 80.0–100.0)
Platelets: 166 10*3/uL (ref 150–400)
RBC: 3.85 MIL/uL — ABNORMAL LOW (ref 3.87–5.11)
RDW: 15 % (ref 11.5–15.5)
WBC: 8.7 10*3/uL (ref 4.0–10.5)
nRBC: 0 % (ref 0.0–0.2)

## 2022-01-20 LAB — BASIC METABOLIC PANEL
Anion gap: 9 (ref 5–15)
BUN: 21 mg/dL (ref 8–23)
CO2: 32 mmol/L (ref 22–32)
Calcium: 8.2 mg/dL — ABNORMAL LOW (ref 8.9–10.3)
Chloride: 98 mmol/L (ref 98–111)
Creatinine, Ser: 1.19 mg/dL — ABNORMAL HIGH (ref 0.44–1.00)
GFR, Estimated: 42 mL/min — ABNORMAL LOW (ref >60)
Glucose, Bld: 80 mg/dL (ref 70–99)
Potassium: 3.7 mmol/L (ref 3.5–5.1)
Sodium: 139 mmol/L (ref 135–145)

## 2022-01-20 LAB — MAGNESIUM: Magnesium: 1.9 mg/dL (ref 1.7–2.4)

## 2022-01-20 LAB — PHOSPHORUS: Phosphorus: 3.5 mg/dL (ref 2.5–4.6)

## 2022-01-20 MED ORDER — SODIUM CHLORIDE 0.9 % IV SOLN
2.0000 g | Freq: Two times a day (BID) | INTRAVENOUS | Status: DC
  Administered 2022-01-20 – 2022-01-22 (×5): 2 g via INTRAVENOUS
  Filled 2022-01-20 (×5): qty 2

## 2022-01-20 NOTE — Evaluation (Signed)
Occupational Therapy Evaluation ?Patient Details ?Name: Crystal Delacruz ?MRN: [id number] ?DOB: 1923-12-20 ?Today's Date: 01/20/2022 ? ? ?History of Present Illness presented to ER secondary to respiratory distress; admitted for management of acute hypoxic respiratory failure, bilat LE cellulitis, UTI.  ? ?Clinical Impression ?  ?Chart reviewed, pt greeted at edge of bed in care of PT. Pt stating "I don't want to look at you", oriented only to self (which is baseline per chart review). Pt appears fearful of falling. MAX A, progressing to close sup required for static sitting balance on edge of bed. Pt requires TOTAL A +2 for lateral scoot transfer to the R from bed>chair. Anticipate MAX A-TOTAL A for all ADL task completion. Pt is performing all ADL/functional mobility below baseline, recommend STR to facilitate return to PLOF, address functional deficits. Pt is left in bedside chair, NAD, all needs met. Per PT report, hoyer lift pad left in room, recommend transfer back to chair with patient care lift via sling. OT will continue to follow acautely.  ?   ? ?Recommendations for follow up therapy are one component of a multi-disciplinary discharge planning process, led by the attending physician.  Recommendations may be updated based on patient status, additional functional criteria and insurance authorization.  ? ?Follow Up Recommendations ? Skilled nursing-short term rehab (<3 hours/day)  ?  ?Assistance Recommended at Discharge Frequent or constant Supervision/Assistance  ?Patient can return home with the following Two people to help with walking and/or transfers;Two people to help with bathing/dressing/bathroom;Assistance with cooking/housework;Direct supervision/assist for financial management;Help with stairs or ramp for entrance;Direct supervision/assist for medications management;Assist for transportation ? ?  ?Functional Status Assessment ? Patient has had a recent decline in their functional status and demonstrates  the ability to make significant improvements in function in a reasonable and predictable amount of time.  ?Equipment Recommendations ? Other (comment) (per next venue of care)  ?  ?Recommendations for Other Services   ? ? ?  ?Precautions / Restrictions Precautions ?Precautions: Fall ?Restrictions ?Weight Bearing Restrictions: No  ? ?  ? ?Mobility Bed Mobility ?Overal bed mobility: Needs Assistance ?  ?  ?  ?  ?  ?  ?General bed mobility comments: recieved at edge of bed in care of PT ?  ? ?Transfers ?Overall transfer level: Needs assistance ?  ?Transfers: Bed to chair/wheelchair/BSC ?  ?  ?  ?  ?  ? Lateral/Scoot Transfers: Total assist, +2 physical assistance ?General transfer comment: total assist +2, additional person to assist to stablize chair and assist in diverting maladaptive behavior during transfer(pinching, grabbing) ?  ? ?  ?Balance Overall balance assessment: Needs assistance ?Sitting-balance support: No upper extremity supported, Feet supported ?Sitting balance-Leahy Scale: Poor ?Sitting balance - Comments: posterior lean, intitally max A for static sitting progresses to close sup ?  ?  ?  ?  ?  ?  ?  ?  ?  ?  ?  ?  ?  ?  ?  ?   ? ?ADL either performed or assessed with clinical judgement  ? ?ADL Overall ADL's : Needs assistance/impaired ?  ?  ?  ?  ?  ?  ?  ?  ?  ?  ?  ?  ?  ?  ?  ?  ?  ?  ?  ?General ADL Comments: anticipate MAX A-TOTAL A +1-2 for all ADL at this time;  ? ? ? ?Vision   ?   ?   ?Perception   ?  ?Praxis   ?  ? ?  Pertinent Vitals/Pain Pain Assessment ?Pain Assessment: Faces ?Faces Pain Scale: Hurts little more ?Pain Descriptors / Indicators: Guarding, Grimacing ?Pain Intervention(s): Limited activity within patient's tolerance, Monitored during session, Repositioned  ? ? ? ?Hand Dominance   ?  ?Extremity/Trunk Assessment Upper Extremity Assessment ?Upper Extremity Assessment: Generalized weakness ?  ?Lower Extremity Assessment ?Lower Extremity Assessment: Generalized weakness ?  ?  ?   ?Communication   ?  ?Cognition Arousal/Alertness: Awake/alert ?Behavior During Therapy: Agitated ?Overall Cognitive Status: History of cognitive impairments - at baseline ?Area of Impairment: Orientation, Attention, Memory, Following commands, Safety/judgement, Awareness, Problem solving ?  ?  ?  ?  ?  ?  ?  ?  ?Orientation Level: Place, Time, Situation ?Current Attention Level: Focused ?Memory: Decreased recall of precautions, Decreased short-term memory ?Following Commands: Follows one step commands inconsistently ?Safety/Judgement: Decreased awareness of safety, Decreased awareness of deficits ?Awareness: Intellectual ?Problem Solving: Slow processing, Decreased initiation, Difficulty sequencing, Requires verbal cues, Requires tactile cues ?General Comments: alert to self only (baseline), pt resistant to mobility, yelling at, attempting to pinch therapist ?  ?  ?General Comments    ? ?  ?Exercises   ?  ?Shoulder Instructions    ? ? ?Home Living Family/patient expects to be discharged to:: Private residence ?Living Arrangements: Spouse/significant other ?Available Help at Discharge: Family;Available 24 hours/day ?Type of Home: House ?Home Access: Elevator ?  ?  ?Home Layout: One level ?  ?  ?  ?  ?  ?  ?  ?Home Equipment: Rolling Walker (2 wheels);BSC/3in1;Hand held shower head;Grab bars - tub/shower;Shower seat;Wheelchair - manual;Other (comment) ?  ?  ?  ? ?  ?Prior Functioning/Environment Prior Level of Function : Needs assist;History of Falls (last six months) ?  ?  ?  ?  ?  ?  ?Mobility Comments: Per chart review pt performs mobilty at wc level, assit with transfers PRN. ?ADLs Comments: per chart, pts son and daugther live next door and assist with ADL/IADLs. ?  ? ?  ?  ?OT Problem List: Decreased strength;Decreased activity tolerance;Impaired balance (sitting and/or standing) ?  ?   ?OT Treatment/Interventions: Self-care/ADL training;Therapeutic exercise;Manual therapy;Patient/family education;Neuromuscular  education;DME and/or AE instruction;Cognitive remediation/compensation;Balance training;Therapeutic activities  ?  ?OT Goals(Current goals can be found in the care plan section) Acute Rehab OT Goals ?OT Goal Formulation: Patient unable to participate in goal setting ?ADL Goals ?Pt Will Perform Grooming: with min assist;sitting ?Pt Will Perform Upper Body Dressing: with mod assist;sitting ?Pt Will Perform Lower Body Dressing: with mod assist;sitting/lateral leans ?Pt Will Transfer to Toilet: with mod assist;stand pivot transfer;with +2 assist ?Pt Will Perform Toileting - Clothing Manipulation and hygiene: with mod assist;with 2+ total assist;sit to/from stand  ?OT Frequency: Min 2X/week ?  ? ?Co-evaluation   ?Reason for Co-Treatment: Complexity of the patient's impairments (multi-system involvement);Necessary to address cognition/behavior during functional activity;For patient/therapist safety ?PT goals addressed during session: Mobility/safety with mobility ?OT goals addressed during session: ADL's and self-care ?  ? ?  ?AM-PAC OT "6 Clicks" Daily Activity     ?Outcome Measure Help from another person eating meals?: A Little ?Help from another person taking care of personal grooming?: A Lot ?Help from another person toileting, which includes using toliet, bedpan, or urinal?: Total ?Help from another person bathing (including washing, rinsing, drying)?: Total ?Help from another person to put on and taking off regular upper body clothing?: A Lot ?Help from another person to put on and taking off regular lower body clothing?: Total ?6 Click Score: 10 ?  ?  End of Session Nurse Communication: Mobility status ? ?Activity Tolerance: Patient tolerated treatment well ?Patient left: in chair;with call bell/phone within reach;with chair alarm set;with family/visitor present ? ?OT Visit Diagnosis: Unsteadiness on feet (R26.81);Muscle weakness (generalized) (M62.81)  ?              ?Time: 1207-1223 ?OT Time Calculation (min): 16  min ?Charges:  OT General Charges ?$OT Visit: 1 Visit ?OT Evaluation ?$OT Eval Moderate Complexity: 1 Mod ? ?Shanon Payor, OTD OTR/L  ?01/20/22, 1:53 PM  ?

## 2022-01-20 NOTE — Progress Notes (Signed)
[facility] Progress Note ? ?Patient: Crystal Delacruz    CZY:[id number]  DOA: 01/19/2022    ? ?Date of Service: the patient was seen and examined on 01/20/2022 ? ?Chief Complaint  ?Patient presents with  ? Shortness of Breath  ? ?Brief hospital course: ?Crystal Delacruz is a 88 y.o. female with Past medical history off HTN, HLD, IDDM T2, hypothyroid, dementia AO x1, bilateral lower extremity edema, wheelchair-bound, as reviewed from EMR, presented to [facility] ED with hypoxic respiratory failure.  Family called EMS, patient was found to have oxygen saturation in 70s, patient was briefly placed on nonrebreather, currently saturating well on 3 L oxygen.  ?Patient has dementia AAO x1 which is her baseline, HPI reviewed from EMR and got information from patient's family at bedside. ?  ?ED Course: Tachypneic, respiratory failure on 3 L oxygen currently, mild hypertension. ?BMP mild hyperglycemia glucose 156, creatinine 1.44 at baseline CKD stage III ?BNP 287, lactic acid 1.7 within normal range WBC count 8.9 within normal range ?UA positive, urine culture and blood culture collected. ?CXR No significant change in patchy pulmonary opacities at both lung bases, favored to reflect scarring based on relative stability. No new airspace disease ?Ct stone: 1. Consolidative densities at the right lung base which may represent infiltrate and/or atelectasis, correlate clinically.  ?2. Single small focus of air in the urinary bladder which could be seen with infection, correlate with urinalysis. ?3. No nephrolithiasis or obstructive uropathy. ?4. Mild colonic diverticulosis. ?5. 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 Recommend annual imaging followup by CTA or MRA. This recommendation follows 2010 ?  ? ? ?Assessment and Plan: ?Principal Problem: ?  Respiratory failure with hypoxia ([place]) ?  ?  ?Acute hyper respiratory failure, unknown cause could be secondary to volume overload ?BNP Slightly elevated vs  PNA ?Continue empiric antibiotic ?Lasix 40 mg IV x1 dose given in the ED ?Started Lasix 20 mg IV twice daily ?Follow 2D echocardiogram ?Continue supplemental O2 inhalation and gradually wean off ?RVP  negative for viral infection ?CT stone showed possibility of pulmonary infection, WBC count within normal range ?Continue incentive spirometry ?  ?  ?Bilateral lower extremity cellulitis, patient has erythema and minimal tenderness ?Please look at the pictures in the media section ?Started empiric antibiotics vancomycin and cefepime ?Pharmacy consulted for Vanco dosing and trough monitor ?Monitor renal functions  ?  ?  ?UTI, patient has recurrent UTI in the past few months ?UA positive ?Patient was given ceftriaxone in the ED, started Vanco and cefepime to cover cellulitis of lower extremity ?Follow urine culture and blood culture ?  ?  ?HTN, HLD ?Blood pressure is soft most likely due to infection, no sepsis ?Continue to monitor BP and then resume medications if patient becomes hypotensive ?  ?Hypothyroid, continued Synthroid home dose ?  ?  ?Dementia AAO x1 at baseline ?Continue supportive care ?Debility, patient is wheelchair-bound at baseline ?Follow PT and OT eval if family would like any placement ?  ?  ?Thoracic aortic dilatation as per CT scan ?Ectasia of the ascending thoracic aorta measuring 4.2 cm diameter. Recommend annual imaging followup by CTA or MRA. This recommendation follows 2010 ?Follow with PCP for annual imaging ?  ?CKD stage IIIb, creatinine 1.44 at baseline ?Continue monitor renal functions and urine output ?Creatinine 1.19  ?  ?Body mass index is 28.59 kg/m?.  ?Interventions: ?    ? ?Diet: Cardiac and Carb modified diet ?DVT Prophylaxis: Subcutaneous Lovenox  ? ?Advance goals of  care discussion: DNR ? ?Family Communication: family was present at bedside, at the time of interview.  ?The pt provided permission to discuss medical plan with the family. Opportunity was given to ask question and all  questions were answered satisfactorily.  ? ?Disposition:  ?Pt is from Home, admitted with respiratory failure, UTI and lower extremity cellulitis, still has respiratory failure, on IV antibiotic, which precludes a safe discharge. ?Discharge to home vs SNF as per PT, when clinically stable. ? ?Subjective: No significant overnight events, has dementia AAO x1, seems to be resting comfortably on the bed, did not offer any complaints. ? ? ?Physical Exam: ?General:  alert, NAD AAO x 1.  ?Appear in no distress, affect appropriate ?Eyes: PERRLA ?ENT: Oral Mucosa Clear, moist  ?Neck: no JVD,  ?Cardiovascular: S1 and S2 Present, no Murmur,  ?Respiratory: good respiratory effort, Bilateral Air entry equal and Decreased, b/l Crackles, no wheezes ?Abdomen: Bowel Sound present, Soft and no tenderness,  ?Skin: no rashes ?Extremities: 1-2+ Pedal edema, erythema and mild tenderness,no calf tenderness ?Neurologic: without any new focal findings ?Gait not checked due to patient safety concerns ? ?Vitals:  ? 01/19/22 2052 01/20/22 0423 01/20/22 0810 01/20/22 1128  ?BP: (!) 117/52 (!) 115/48 (!) 133/58 (!) 102/50  ?Pulse: 70 70 74 77  ?Resp: '16 16 19 16  '$ ?Temp: 97.8 ?F (36.6 ?C) 98 ?F (36.7 ?C) 98.5 ?F (36.9 ?C) 98 ?F (36.7 ?C)  ?TempSrc:  Oral  Oral  ?SpO2: 96% 98% 95% 95%  ?Weight:      ?Height:      ? ? ?Intake/Output Summary (Last 24 hours) at 01/20/2022 1421 ?Last data filed at 01/20/2022 1130 ?Gross per 24 hour  ?Intake 673 ml  ?Output 2350 ml  ?Net -1677 ml  ? ?Filed Weights  ? 01/19/22 1547  ?Weight: 82.8 kg  ? ? ?Data Reviewed: ?I have personally reviewed and interpreted daily labs, tele strips, imagings as discussed above. ?I reviewed all nursing notes, pharmacy notes, vitals, pertinent old records ?I have discussed plan of care as described above with RN and patient/family. ? ?CBC: ?Recent Labs  ?Lab 01/19/22 ?1151 01/20/22 ?0832  ?WBC 8.9 8.7  ?NEUTROABS 5.8  --   ?HGB 12.0 11.7*  ?HCT 39.9 38.1  ?MCV 99.3 99.0  ?PLT 203 166   ? ?Basic Metabolic Panel: ?Recent Labs  ?Lab 01/19/22 ?1151 01/20/22 ?0832  ?NA 141 139  ?K 4.2 3.7  ?CL 100 98  ?CO2 31 32  ?GLUCOSE 156* 80  ?BUN 25* 21  ?CREATININE 1.44* 1.19*  ?CALCIUM 8.3* 8.2*  ?MG  --  1.9  ?PHOS  --  3.5  ? ? ?Studies: ?No results found.  ?Scheduled Meds: ? aspirin EC  81 mg Oral Daily  ? enoxaparin (LOVENOX) injection  30 mg Subcutaneous Q24H  ? feeding supplement (GLUCERNA SHAKE)  237 mL Oral TID BM  ? furosemide  20 mg Intravenous Q12H  ? gabapentin  200 mg Oral TID  ? insulin aspart  0-15 Units Subcutaneous TID WC  ? levothyroxine  50 mcg Oral Daily  ? sodium chloride flush  3 mL Intravenous Q12H  ? ?Continuous Infusions: ? sodium chloride 250 mL (01/19/22 1955)  ? ceFEPime (MAXIPIME) IV 2 g (01/20/22 1124)  ? vancomycin 1,500 mg (01/19/22 1958)  ? ?PRN Meds: sodium chloride, acetaminophen **OR** acetaminophen, bisacodyl, ondansetron **OR** ondansetron (ZOFRAN) IV, oxyCODONE, sodium chloride flush ? ?Time spent: 35 minutes ? ?Author: ?Val Riles MD ?[facility] ?01/20/2022 2:21 PM ? ?To reach On-call, see  care teams to locate the attending and reach out to them via [email or web address]. ?If 7PM-7AM, please contact night-coverage ?If you still have difficulty reaching the attending provider, please page the [facility] (Director on Call) for [facility] on amion for assistance. ? ?

## 2022-01-20 NOTE — Evaluation (Signed)
Physical Therapy Evaluation ?Patient Details ?Name: Crystal Delacruz ?MRN: [id number] ?DOB: 08-Oct-1924 ?Today's Date: 01/20/2022 ? ?History of Present Illness ? presented to ER secondary to respiratory distress; admitted for management of acute hypoxic respiratory failure, bilat LE cellulitis, UTI.  ?Clinical Impression ? Patient resting in bed upon arrival to room, husband at bedside assisting with lunch.  Both agreeable to therapy/OOB attempt prior to finishing lunch.  Patient alert and oriented to self only; intermittently follows commands, but generally agitated with therapist and movement attempts, resisting movement, pinching, swatting at therapist at times.  Redirected as able.  Significant ROM deficits to bilat shoulders (chronic, arthritic changes); global weakness throughout extremities otherwise.  Currently requiring total assist +1-2 for bed mobility; max fluctuating to min assist for unsupported sitting balance; total assist +2-3 for lateral scoot pivot (level surfaces) from bed/chair.  Extensive/total assist for trunk control, forward trunk lean and lateral movement (3rd person to stabilize chair and distract patient's hands to prevent pinching, grabbing).  Patient calmed once positioned and supported in chair.  ?Standing/additional mobility deferred.  Do recommend use of hoyer for return to bed; sling placed in room, nursing notified of performance and recommendations for return to bed. ?Would benefit from skilled PT to address above deficits and promote optimal return to PLOF.; recommend transition to STR upon discharge from acute hospitalization. ? ?   ? ?Recommendations for follow up therapy are one component of a multi-disciplinary discharge planning process, led by the attending physician.  Recommendations may be updated based on patient status, additional functional criteria and insurance authorization. ? ?Follow Up Recommendations Skilled nursing-short term rehab (<3 hours/day) ? ?  ?Assistance  Recommended at Discharge Frequent or constant Supervision/Assistance  ?Patient can return home with the following ? Two people to help with walking and/or transfers;Two people to help with bathing/dressing/bathroom;Assistance with cooking/housework;Assist for transportation;Help with stairs or ramp for entrance ? ?  ?Equipment Recommendations    ?Recommendations for Other Services ?    ?  ?Functional Status Assessment Patient has had a recent decline in their functional status and demonstrates the ability to make significant improvements in function in a reasonable and predictable amount of time.  ? ?  ?Precautions / Restrictions Precautions ?Precautions: Fall ?Restrictions ?Weight Bearing Restrictions: No  ? ?  ? ?Mobility ? Bed Mobility ?Overal bed mobility: Needs Assistance ?Bed Mobility: Supine to Sit ?  ?  ?  ?  ?  ?General bed mobility comments: extensive assist for LE management, truncal elevation; resistive to movement at times, constant encouragement for participation with session ?  ? ?Transfers ?Overall transfer level: Needs assistance ?  ?Transfers: Bed to chair/wheelchair/BSC ?  ?  ?  ?  ?  ? Lateral/Scoot Transfers: Total assist, +2 physical assistance ?General transfer comment: Total assist from +2 for trunk control, weight shift and lateral movement; third person present to stabilize chair and hold patient's hand (to divert pinching, grabbing behaviors) ?  ? ?Ambulation/Gait ?  ?  ?  ?  ?  ?  ?  ?General Gait Details: unsafe/unable ? ?Stairs ?  ?  ?  ?  ?  ? ?Wheelchair Mobility ?  ? ?Modified Rankin (Stroke Patients Only) ?  ? ?  ? ?Balance Overall balance assessment: Needs assistance ?Sitting-balance support: No upper extremity supported, Feet supported ?Sitting balance-Leahy Scale: Poor ?Sitting balance - Comments: heavy L posterior lean/push in sitting; max assist for initial sitting balance, does progress to close sup with accommodation to position and increased active engagement/paricipation  with session ?  ?  ?  ?  ?  ?  ?  ?  ?  ?  ?  ?  ?  ?  ?  ?   ? ? ? ?Pertinent Vitals/Pain Pain Assessment ?Pain Assessment: Faces ?Faces Pain Scale: Hurts little more ?Pain Descriptors / Indicators: Aching, Guarding, Grimacing ?Pain Intervention(s): Limited activity within patient's tolerance, Monitored during session, Repositioned  ? ? ?Home Living Family/patient expects to be discharged to:: Private residence ?Living Arrangements: Spouse/significant other ?Available Help at Discharge: Family;Available 24 hours/day ?Type of Home: House ?Home Access: Elevator ?  ?  ?  ?Home Layout: One level ?Home Equipment: Rolling Walker (2 wheels);BSC/3in1;Hand held shower head;Grab bars - tub/shower;Shower seat;Wheelchair - manual;Other (comment) ?   ?  ?Prior Function Prior Level of Function : Needs assist;History of Falls (last six months) ?  ?  ?  ?  ?  ?  ?Mobility Comments: Patient unable to provide information.  Per chart (confirmed with husband at bedside); WC level as primary mobility, +1 assist for basic transfers as needed. Has required increased assist in recent days due to medical decline. ?ADLs Comments: Per chart, pt's son and daughter in law live next door and assist with most meals, transportation, bathing, and dressing. Spouse manages medications. 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[place]) ?  ? ? ?Hand Dominance  ?   ? ?  ?Extremity/Trunk Assessment  ? Upper Extremity Assessment ?Upper Extremity Assessment: Generalized weakness (signficant ROM deficits to bilat shoulders, act assist for elevation approx 80 degrees; elbows, wrists and hands grossly WFL) ?  ? ?Lower Extremity Assessment ?Lower Extremity Assessment: Generalized weakness (grossly 3-/4 throughout; signficiant erythema to R > L LE) ?  ? ?   ?Communication  ?    ?Cognition Arousal/Alertness: Awake/alert ?Behavior During Therapy: [facility] for tasks assessed/performed ?Overall Cognitive Status: Within Functional Limits for tasks assessed ?  ?  ?  ?   ?  ?  ?  ?  ?  ?  ?  ?  ?  ?  ?  ?  ?  ?  ?  ? ?  ?General Comments   ? ?  ?Exercises    ? ?Assessment/Plan  ?  ?PT Assessment Patient needs continued PT services  ?PT Problem List Decreased strength;Decreased activity tolerance;Decreased balance;Decreased range of motion;Decreased mobility;Decreased coordination;Decreased cognition;Decreased knowledge of use of DME;Decreased safety awareness;Decreased knowledge of precautions ? ?   ?  ?PT Treatment Interventions DME instruction;Functional mobility training;Therapeutic activities;Therapeutic exercise;Balance training;Patient/family education;Cognitive remediation   ? ?PT Goals (Current goals can be found in the Care Plan section)  ?Acute Rehab PT Goals ?PT Goal Formulation: Patient unable to participate in goal setting ?Time For Goal Achievement: 02/03/22 ?Potential to Achieve Goals: Fair ? ?  ?Frequency Min 2X/week ?  ? ? ?Co-evaluation PT/OT/SLP Co-Evaluation/Treatment: Yes ?Reason for Co-Treatment: Complexity of the patient's impairments (multi-system involvement);For patient/therapist safety;To address functional/ADL transfers ?PT goals addressed during session: Mobility/safety with mobility ?OT goals addressed during session: ADL's and self-care ?  ? ? ?  ?AM-PAC PT "6 Clicks" Mobility  ?Outcome Measure Help needed turning from your back to your side while in a flat bed without using bedrails?: Total ?Help needed moving from lying on your back to sitting on the side of a flat bed without using bedrails?: Total ?Help needed moving to and from a bed to a chair (including a wheelchair)?: Total ?Help needed standing up from a chair using your arms (e.g., wheelchair or bedside  chair)?: Total ?Help needed to walk in hospital room?: Total ?Help needed climbing 3-5 steps with a railing? : Total ?6 Click Score: 6 ? ?  ?End of Session   ?Activity Tolerance: Treatment limited secondary to agitation ?Patient left: in chair;with call bell/phone within reach;with chair  alarm set;with family/visitor present ?Nurse Communication: Mobility status ?PT Visit Diagnosis: Muscle weakness (generalized) (M62.81);Difficulty in walking, not elsewhere classified (R26.2) ?  ? ?Time: 7543-606

## 2022-01-20 NOTE — Consult Note (Signed)
Pharmacy Antibiotic Note ? ?Crystal Delacruz is a 88 y.o. female admitted on 01/19/2022 with Bilateral LE cellulitis.  Pharmacy has been consulted for Cefepime and Vancomycin dosing. ? ?-hx DM, possible UTI? ? ?Plan: ?Scr improved 1.44>>1.19 ? ?Adjust Cefepime 2gm from q 24 hours to Q12h for Crcl 31.3 ml/min  ? ?2.  Continue Vancomycin '1500mg'$  q 48hrs for now- will reassess Scr in am for possible adjustment to q36h regimen. (Last dose given 3/17 @ 1958) ?Goal AUC 400-550. ?Expected AUC: 462 ?SCr used: 1.19 ?Cmin 9.7 ? ? ? ?Height: '5\' 7"'$  (170.2 cm) ?Weight: 82.8 kg (182 lb 8.7 oz) ?IBW/kg (Calculated) : 61.6 ? ?Temp (24hrs), Avg:98.2 ?F (36.8 ?C), Min:97.8 ?F (36.6 ?C), Max:98.6 ?F (37 ?C) ? ?Recent Labs  ?Lab 01/19/22 ?1151 01/20/22 ?0832  ?WBC 8.9 8.7  ?CREATININE 1.44* 1.19*  ?LATICACIDVEN 1.7  --   ? ?  ?Estimated Creatinine Clearance: 31.3 mL/min (A) (by C-G formula based on SCr of 1.19 mg/dL (H)).   ? ?Allergies  ?Allergen Reactions  ? Elemental Sulfur   ? Latex Swelling  ? Morphine And Related   ? ? ?Antimicrobials this admission: ?Ceftriaxone 3/17 x1 ?Cefepime 3/17 >>  ?Vancomycin 3/17 >>  ? ?Dose adjustments this admission:  ?3/18 cefepime 2 gm q24h>>q12h ? ?Microbiology results: ?3/17 BCx: NG<24hrs ?3/17 UCx: sent  ? ?Thank you for allowing pharmacy to be a part of this patient?s care. ? ?Chinita Greenland PharmD ?Clinical Pharmacist ?01/20/2022 ? ? ?

## 2022-01-21 ENCOUNTER — Encounter: Payer: Self-pay | Admitting: Student

## 2022-01-21 DIAGNOSIS — J9601 Acute respiratory failure with hypoxia: Secondary | ICD-10-CM | POA: Diagnosis not present

## 2022-01-21 LAB — CBC
HCT: 34.6 % — ABNORMAL LOW (ref 36.0–46.0)
Hemoglobin: 10.6 g/dL — ABNORMAL LOW (ref 12.0–15.0)
MCH: 30 pg (ref 26.0–34.0)
MCHC: 30.6 g/dL (ref 30.0–36.0)
MCV: 98 fL (ref 80.0–100.0)
Platelets: 200 10*3/uL (ref 150–400)
RBC: 3.53 MIL/uL — ABNORMAL LOW (ref 3.87–5.11)
RDW: 14.9 % (ref 11.5–15.5)
WBC: 9.7 10*3/uL (ref 4.0–10.5)
nRBC: 0 % (ref 0.0–0.2)

## 2022-01-21 LAB — BASIC METABOLIC PANEL
Anion gap: 9 (ref 5–15)
BUN: 18 mg/dL (ref 8–23)
CO2: 31 mmol/L (ref 22–32)
Calcium: 8 mg/dL — ABNORMAL LOW (ref 8.9–10.3)
Chloride: 98 mmol/L (ref 98–111)
Creatinine, Ser: 1 mg/dL (ref 0.44–1.00)
GFR, Estimated: 52 mL/min — ABNORMAL LOW (ref >60)
Glucose, Bld: 134 mg/dL — ABNORMAL HIGH (ref 70–99)
Potassium: 3.6 mmol/L (ref 3.5–5.1)
Sodium: 138 mmol/L (ref 135–145)

## 2022-01-21 LAB — GLUCOSE, CAPILLARY
Glucose-Capillary: 140 mg/dL — ABNORMAL HIGH (ref 70–99)
Glucose-Capillary: 146 mg/dL — ABNORMAL HIGH (ref 70–99)
Glucose-Capillary: 158 mg/dL — ABNORMAL HIGH (ref 70–99)
Glucose-Capillary: 150 mg/dL — ABNORMAL HIGH (ref 70–99)

## 2022-01-21 LAB — MAGNESIUM: Magnesium: 1.8 mg/dL (ref 1.7–2.4)

## 2022-01-21 LAB — PHOSPHORUS: Phosphorus: 2.8 mg/dL (ref 2.5–4.6)

## 2022-01-21 MED ORDER — VANCOMYCIN HCL IN DEXTROSE 1-5 GM/200ML-% IV SOLN
1000.0000 mg | INTRAVENOUS | Status: DC
  Administered 2022-01-21: 1000 mg via INTRAVENOUS
  Filled 2022-01-21 (×2): qty 200

## 2022-01-21 MED ORDER — FUROSEMIDE 10 MG/ML IJ SOLN
40.0000 mg | Freq: Two times a day (BID) | INTRAMUSCULAR | Status: DC
  Administered 2022-01-21 – 2022-01-22 (×3): 40 mg via INTRAVENOUS
  Filled 2022-01-21 (×3): qty 4

## 2022-01-21 MED ORDER — CHLORHEXIDINE GLUCONATE CLOTH 2 % EX PADS
6.0000 | MEDICATED_PAD | Freq: Every day | CUTANEOUS | Status: DC
  Administered 2022-01-21 – 2022-01-24 (×3): 6 via TOPICAL

## 2022-01-21 NOTE — Plan of Care (Signed)

## 2022-01-21 NOTE — Consult Note (Signed)
Pharmacy Antibiotic Note ? ?Crystal Delacruz is a 88 y.o. female admitted on 01/19/2022 with Bilateral LE cellulitis.  Pharmacy has been consulted for Cefepime and Vancomycin dosing. ? ?-hx DM, possible UTI? ? ?Plan: ?Scr improved 1.44>>1.19>> 1.00 ? ?continue Cefepime 2gm Q12h for Crcl 37.2 ml/min  ? ?2.  Adjust Vancomycin to 1 gram IV q24h. ? -reassess Scr in am (has been continuing to improve) ?Goal AUC 400-550. ?Expected AUC: 531 ?SCr used: 1.00 ?Cmin 15 ? ? ? ?Height: '5\' 7"'$  (170.2 cm) ?Weight: 82.8 kg (182 lb 8.7 oz) ?IBW/kg (Calculated) : 61.6 ? ?Temp (24hrs), Avg:98.1 ?F (36.7 ?C), Min:97.8 ?F (36.6 ?C), Max:98.3 ?F (36.8 ?C) ? ?Recent Labs  ?Lab 01/19/22 ?1151 01/20/22 ?0832 01/21/22 ?0421  ?WBC 8.9 8.7 9.7  ?CREATININE 1.44* 1.19* 1.00  ?LATICACIDVEN 1.7  --   --   ? ?  ?Estimated Creatinine Clearance: 37.2 mL/min (by C-G formula based on SCr of 1 mg/dL).   ? ?Allergies  ?Allergen Reactions  ? Elemental Sulfur   ? Latex Swelling  ? Morphine And Related   ? ? ?Antimicrobials this admission: ?Ceftriaxone 3/17 x1 ?Cefepime 3/17 >>  ?Vancomycin 3/17 >>  ? ?Dose adjustments this admission:  ?3/18 cefepime 2 gm q24h>>q12h ?3/19 Vanc 1500 q48h to 1 gm q24h ? ?Microbiology results: ?3/17 BCx: NG 2d ?3/17 UCx: sent  ? ?Thank you for allowing pharmacy to be a part of this patient?s care. ? ?Chinita Greenland PharmD ?Clinical Pharmacist ?01/21/2022 ? ? ?

## 2022-01-21 NOTE — TOC Progression Note (Addendum)
Transition of Care (TOC) - Progression Note  ? ? ?Patient Details  ?Name: Crystal Delacruz ?MRN: [id number] ?Date of Birth: Jan 05, 1924 ? ?Transition of Care (TOC) CM/SW Contact  ?Izola Price, RN ?Phone Number: ?01/21/2022, 3:26 PM ? ?Clinical Narrative:  3/19: Spoke with son regarding STR/SNF recommendations and process. They are currently declining STR/SNF as they feel she is at baseline and have been taking care of her at home with [facility] services including [place] aide. Son stated he could not remember agency but they were good with continuing with that agency at discharge. Chart indicates [facility]. Contacted [facility] accepted pending orders.  Updated provider, unit RN, and PT via secure chat.  Simmie Davies RN CM  ? ? ? ?  ?  ? ?Expected Discharge Plan and Services ?  ?  ?  ?  ?  ?                ?  ?  ?  ?  ?  ?  ?  ?  ?  ?  ? ? ?Social Determinants of Health (SDOH) Interventions ?  ? ?Readmission Risk Interventions ?No flowsheet data found. ? ?

## 2022-01-21 NOTE — Progress Notes (Signed)
[facility] Progress Note ? ?Patient: Crystal Delacruz    GMW:[id number]  DOA: 01/19/2022    ? ?Date of Service: the patient was seen and examined on 01/21/2022 ? ?Chief Complaint  ?Patient presents with  ? Shortness of Breath  ? ?Brief hospital course: ?Crystal Delacruz is a 88 y.o. female with Past medical history off HTN, HLD, IDDM T2, hypothyroid, dementia AO x1, bilateral lower extremity edema, wheelchair-bound, as reviewed from EMR, presented to [facility] ED with hypoxic respiratory failure.  Family called EMS, patient was found to have oxygen saturation in 70s, patient was briefly placed on nonrebreather, currently saturating well on 3 L oxygen.  ?Patient has dementia AAO x1 which is her baseline, HPI reviewed from EMR and got information from patient's family at bedside. ?  ?ED Course: Tachypneic, respiratory failure on 3 L oxygen currently, mild hypertension. ?BMP mild hyperglycemia glucose 156, creatinine 1.44 at baseline CKD stage III ?BNP 287, lactic acid 1.7 within normal range WBC count 8.9 within normal range ?UA positive, urine culture and blood culture collected. ?CXR No significant change in patchy pulmonary opacities at both lung bases, favored to reflect scarring based on relative stability. No new airspace disease ?Ct stone: 1. Consolidative densities at the right lung base which may represent infiltrate and/or atelectasis, correlate clinically.  ?2. Single small focus of air in the urinary bladder which could be seen with infection, correlate with urinalysis. ?3. No nephrolithiasis or obstructive uropathy. ?4. Mild colonic diverticulosis. ?5. 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 Recommend annual imaging followup by CTA or MRA. This recommendation follows 2010 ?  ? ? ?Assessment and Plan: ?Principal Problem: ?  Respiratory failure with hypoxia ([place]) ?  ?  ?Acute hyper respiratory failure, unknown cause could be secondary to volume overload ?BNP Slightly elevated vs  PNA ?Continue empiric antibiotic ?Lasix 40 mg IV x1 dose given in the ED ?S/p Lasix 20 mg IV twice daily, on 3/19 increased to Lasix 40 mg IV twice daily due to persistent hypoxia ?Follow 2D echocardiogram ?Continue supplemental O2 inhalation and gradually wean off ?RVP  negative for viral infection ?CT stone showed possibility of pulmonary infection, WBC count within normal range ?Continue incentive spirometry ?  ?  ?Bilateral lower extremity cellulitis, patient has erythema and minimal tenderness ?Please look at the pictures in the media section ?Started empiric antibiotics vancomycin and cefepime ?Pharmacy consulted for Vanco dosing and trough monitor ?Monitor renal functions  ?3/19 cellulitis gradually improving R >L ?  ?UTI, patient has recurrent UTI in the past few months ?UA positive ?Patient was given ceftriaxone in the ED, started Vanco and cefepime to cover cellulitis of lower extremity ?Follow urine culture reintubated for better growth ?and blood culture NGTD ?  ?  ?HTN, HLD ?Blood pressure is soft most likely due to infection, no sepsis ?Continue to monitor BP and then resume medications if patient becomes hypotensive ?  ?Hypothyroid, continued Synthroid home dose ?  ?  ?Dementia AAO x1 at baseline ?Continue supportive care ?Debility, patient is wheelchair-bound at baseline ?Follow PT and OT eval if family would like any placement ?  ?  ?Thoracic aortic dilatation as per CT scan ?Ectasia of the ascending thoracic aorta measuring 4.2 cm diameter. Recommend annual imaging followup by CTA or MRA. This recommendation follows 2010 ?Follow with PCP for annual imaging ?  ?CKD stage IIIb, creatinine 1.44 at baseline ?Continue monitor renal functions and urine output ?Creatinine 1.19 --1.0 stable  ?  ?Body mass index  is 28.59 kg/m?.  ?Interventions: ?    ? ?Diet: Cardiac and Carb modified diet ?DVT Prophylaxis: Subcutaneous Lovenox  ? ?Advance goals of care discussion: DNR ? ?Family Communication: family was  present at bedside, at the time of interview.  ?The pt provided permission to discuss medical plan with the family. Opportunity was given to ask question and all questions were answered satisfactorily.  ? ?Disposition:  ?Pt is from Home, admitted with respiratory failure, UTI and lower extremity cellulitis, still has respiratory failure, on IV antibiotic, which precludes a safe discharge. ?Discharge to home vs SNF as per PT if family agrees, when clinically stable. ?Patient can be discharged in 1 to 2 days once hypoxia resolved ? ? ?Subjective: No significant overnight events, has dementia AAO x1, seems to be resting comfortably on the bed, did not offer any complaints. ? ? ?Physical Exam: ?General:  alert, NAD AAO x 1.  ?Appear in no distress, affect appropriate ?Eyes: PERRLA ?ENT: Oral Mucosa Clear, moist  ?Neck: no JVD,  ?Cardiovascular: S1 and S2 Present, no Murmur,  ?Respiratory: good respiratory effort, Bilateral Air entry equal and Decreased, b/l Crackles, no wheezes ?Abdomen: Bowel Sound present, Soft and no tenderness,  ?Skin: no rashes ?Extremities: 1+ Pedal edema, erythema, no calf tenderness, cellulitis gradually improving R>L ?Neurologic: without any new focal findings ?Gait not checked due to patient safety concerns ? ? ?Vitals:  ? 01/20/22 2336 01/21/22 0431 01/21/22 0810 01/21/22 1219  ?BP: (!) 125/57 (!) 144/86 (!) 135/52 (!) 127/53  ?Pulse: 89 (!) 109 91 92  ?Resp: (!) '21 18 18 18  '$ ?Temp: 98.3 ?F (36.8 ?C) 97.8 ?F (36.6 ?C) 98.2 ?F (36.8 ?C) 98.6 ?F (37 ?C)  ?TempSrc: Oral Oral Oral Oral  ?SpO2: 100% 91% 98% 92%  ?Weight:      ?Height:      ? ? ?Intake/Output Summary (Last 24 hours) at 01/21/2022 1234 ?Last data filed at 01/21/2022 0434 ?Gross per 24 hour  ?Intake 383 ml  ?Output 1475 ml  ?Net -1092 ml  ? ?Filed Weights  ? 01/19/22 1547  ?Weight: 82.8 kg  ? ? ?Data Reviewed: ?I have personally reviewed and interpreted daily labs, tele strips, imagings as discussed above. ?I reviewed all nursing  notes, pharmacy notes, vitals, pertinent old records ?I have discussed plan of care as described above with RN and patient/family. ? ?CBC: ?Recent Labs  ?Lab 01/19/22 ?1151 01/20/22 ?0832 01/21/22 ?0421  ?WBC 8.9 8.7 9.7  ?NEUTROABS 5.8  --   --   ?HGB 12.0 11.7* 10.6*  ?HCT 39.9 38.1 34.6*  ?MCV 99.3 99.0 98.0  ?PLT 203 166 200  ? ?Basic Metabolic Panel: ?Recent Labs  ?Lab 01/19/22 ?1151 01/20/22 ?0832 01/21/22 ?0421  ?NA 141 139 138  ?K 4.2 3.7 3.6  ?CL 100 98 98  ?CO2 31 32 31  ?GLUCOSE 156* 80 134*  ?BUN 25* 21 18  ?CREATININE 1.44* 1.19* 1.00  ?CALCIUM 8.3* 8.2* 8.0*  ?MG  --  1.9 1.8  ?PHOS  --  3.5 2.8  ? ? ?Studies: ?No results found.  ?Scheduled Meds: ? aspirin EC  81 mg Oral Daily  ? Chlorhexidine Gluconate Cloth  6 each Topical Daily  ? enoxaparin (LOVENOX) injection  30 mg Subcutaneous Q24H  ? feeding supplement (GLUCERNA SHAKE)  237 mL Oral TID BM  ? furosemide  40 mg Intravenous Q12H  ? gabapentin  200 mg Oral TID  ? insulin aspart  0-15 Units Subcutaneous TID WC  ? levothyroxine  50 mcg  Oral Daily  ? sodium chloride flush  3 mL Intravenous Q12H  ? ?Continuous Infusions: ? sodium chloride 250 mL (01/19/22 1955)  ? ceFEPime (MAXIPIME) IV 2 g (01/21/22 1010)  ? vancomycin    ? ?PRN Meds: sodium chloride, acetaminophen **OR** acetaminophen, bisacodyl, ondansetron **OR** ondansetron (ZOFRAN) IV, oxyCODONE, sodium chloride flush ? ?Time spent: 35 minutes ? ?Author: ?Val Riles MD ?[facility] ?01/21/2022 12:34 PM ? ?To reach On-call, see care teams to locate the attending and reach out to them via [email or web address]. ?If 7PM-7AM, please contact night-coverage ?If you still have difficulty reaching the attending provider, please page the [facility] (Director on Call) for [facility] on amion for assistance. ? ?

## 2022-01-22 ENCOUNTER — Inpatient Hospital Stay: Payer: PPO

## 2022-01-22 ENCOUNTER — Encounter: Payer: Self-pay | Admitting: Student

## 2022-01-22 DIAGNOSIS — L03119 Cellulitis of unspecified part of limb: Secondary | ICD-10-CM

## 2022-01-22 DIAGNOSIS — N183 Chronic kidney disease, stage 3 unspecified: Secondary | ICD-10-CM

## 2022-01-22 DIAGNOSIS — E876 Hypokalemia: Secondary | ICD-10-CM

## 2022-01-22 DIAGNOSIS — J9601 Acute respiratory failure with hypoxia: Secondary | ICD-10-CM | POA: Diagnosis not present

## 2022-01-22 DIAGNOSIS — L0390 Cellulitis, unspecified: Secondary | ICD-10-CM

## 2022-01-22 LAB — CBC
HCT: 35.8 % — ABNORMAL LOW (ref 36.0–46.0)
Hemoglobin: 11.3 g/dL — ABNORMAL LOW (ref 12.0–15.0)
MCH: 30.1 pg (ref 26.0–34.0)
MCHC: 31.6 g/dL (ref 30.0–36.0)
MCV: 95.2 fL (ref 80.0–100.0)
Platelets: 214 10*3/uL (ref 150–400)
RBC: 3.76 MIL/uL — ABNORMAL LOW (ref 3.87–5.11)
RDW: 14.6 % (ref 11.5–15.5)
WBC: 9.5 10*3/uL (ref 4.0–10.5)
nRBC: 0 % (ref 0.0–0.2)

## 2022-01-22 LAB — BASIC METABOLIC PANEL
Anion gap: 13 (ref 5–15)
BUN: 15 mg/dL (ref 8–23)
CO2: 30 mmol/L (ref 22–32)
Calcium: 8 mg/dL — ABNORMAL LOW (ref 8.9–10.3)
Chloride: 95 mmol/L — ABNORMAL LOW (ref 98–111)
Creatinine, Ser: 1.05 mg/dL — ABNORMAL HIGH (ref 0.44–1.00)
GFR, Estimated: 49 mL/min — ABNORMAL LOW (ref >60)
Glucose, Bld: 223 mg/dL — ABNORMAL HIGH (ref 70–99)
Potassium: 3.4 mmol/L — ABNORMAL LOW (ref 3.5–5.1)
Sodium: 138 mmol/L (ref 135–145)

## 2022-01-22 LAB — MAGNESIUM: Magnesium: 1.6 mg/dL — ABNORMAL LOW (ref 1.7–2.4)

## 2022-01-22 LAB — ECHOCARDIOGRAM COMPLETE
Area-P 1/2: 3.21 cm2
Height: 67 in
S' Lateral: 2.6 cm
Weight: 2920.65 oz

## 2022-01-22 LAB — URINE CULTURE: Culture: >=100000 — AB

## 2022-01-22 LAB — GLUCOSE, CAPILLARY
Glucose-Capillary: 143 mg/dL — ABNORMAL HIGH (ref 70–99)
Glucose-Capillary: 177 mg/dL — ABNORMAL HIGH (ref 70–99)
Glucose-Capillary: 207 mg/dL — ABNORMAL HIGH (ref 70–99)
Glucose-Capillary: 147 mg/dL — ABNORMAL HIGH (ref 70–99)

## 2022-01-22 LAB — PHOSPHORUS: Phosphorus: 2.5 mg/dL (ref 2.5–4.6)

## 2022-01-22 MED ORDER — AMOXICILLIN-POT CLAVULANATE 875-125 MG PO TABS
1.0000 | ORAL_TABLET | Freq: Two times a day (BID) | ORAL | Status: DC
  Administered 2022-01-22 – 2022-01-23 (×4): 1 via ORAL
  Filled 2022-01-22 (×4): qty 1

## 2022-01-22 MED ORDER — POTASSIUM CHLORIDE 10 MEQ/100ML IV SOLN
10.0000 meq | INTRAVENOUS | Status: AC
  Administered 2022-01-22 (×2): 10 meq via INTRAVENOUS
  Filled 2022-01-22 (×2): qty 100

## 2022-01-22 MED ORDER — POTASSIUM CHLORIDE 20 MEQ PO PACK
40.0000 meq | PACK | Freq: Once | ORAL | Status: DC
  Filled 2022-01-22: qty 2

## 2022-01-22 MED ORDER — TORSEMIDE 20 MG PO TABS
40.0000 mg | ORAL_TABLET | Freq: Every day | ORAL | Status: DC
  Administered 2022-01-22: 40 mg via ORAL
  Filled 2022-01-22 (×2): qty 2

## 2022-01-22 MED ORDER — POTASSIUM CHLORIDE CRYS ER 20 MEQ PO TBCR
40.0000 meq | EXTENDED_RELEASE_TABLET | Freq: Once | ORAL | Status: DC
  Filled 2022-01-22: qty 2

## 2022-01-22 MED ORDER — MAGNESIUM SULFATE 4 GM/100ML IV SOLN
4.0000 g | Freq: Once | INTRAVENOUS | Status: AC
  Administered 2022-01-22: 4 g via INTRAVENOUS
  Filled 2022-01-22: qty 100

## 2022-01-22 MED ORDER — ENOXAPARIN SODIUM 40 MG/0.4ML IJ SOSY
40.0000 mg | PREFILLED_SYRINGE | INTRAMUSCULAR | Status: DC
  Administered 2022-01-22 – 2022-01-23 (×2): 40 mg via SUBCUTANEOUS
  Filled 2022-01-22 (×2): qty 0.4

## 2022-01-22 MED ORDER — METOPROLOL SUCCINATE ER 25 MG PO TB24
25.0000 mg | ORAL_TABLET | Freq: Two times a day (BID) | ORAL | Status: DC
  Administered 2022-01-22 – 2022-01-23 (×2): 25 mg via ORAL
  Filled 2022-01-22 (×5): qty 1

## 2022-01-22 MED ORDER — FUROSEMIDE 40 MG PO TABS: 40.0000 mg | ORAL_TABLET | Freq: Every day | ORAL | Status: DC

## 2022-01-22 MED ORDER — GLIMEPIRIDE 4 MG PO TABS
4.0000 mg | ORAL_TABLET | Freq: Every day | ORAL | Status: DC
  Administered 2022-01-23 – 2022-01-24 (×2): 4 mg via ORAL
  Filled 2022-01-22 (×2): qty 1

## 2022-01-22 MED ORDER — DOXYCYCLINE HYCLATE 100 MG PO TABS
100.0000 mg | ORAL_TABLET | Freq: Two times a day (BID) | ORAL | Status: DC
  Administered 2022-01-22: 100 mg via ORAL
  Filled 2022-01-22: qty 1

## 2022-01-22 NOTE — Assessment & Plan Note (Signed)
Continue home dose of Synthroid. ?

## 2022-01-22 NOTE — Care Management Important Message (Signed)
Important Message ? ?Patient Details  ?Name: Crystal Delacruz ?MRN: [id number] ?Date of Birth: 1924-06-28 ? ? ?Medicare Important Message Given:  Yes ? ?I reviewed the Important Message from Medicare with the patient's Crystal Delacruz, Crystal Delacruz by phone [phone]) and he is agreement with the discharge plan. I asked if he would like a copy and he declined. I wished her well and thanked him for his time.  ? ? ?Juliann Pulse A Onetta Spainhower ?01/22/2022, 2:47 PM ?

## 2022-01-22 NOTE — Assessment & Plan Note (Signed)
Most likely secondary to diuretic use. ?Also has hypomagnesemia with magnesium of 1.6. ?-Replete potassium and magnesium and monitor ?

## 2022-01-22 NOTE — Progress Notes (Addendum)
? ?      CROSS COVER NOTE ? ?NAME: Crystal Delacruz ?MRN: [id number] ?DOB : [birth year] ? ? ?Secure chat received from nursing reporting a 5 beat run of VT and a 10 beat run of VT approximately one hour apart. Patient asymptomatic and hemodynamically stable. On chart review patient has been having runs of VT since 3/18 per "CV strips" tab. Will order continuous pulse oximetry and check AM labs early. ? ?Plan: ?- Replete Mg with goal >2 ?  Mg 1.6--> 4 gm IV ordered ?- Replete K with goal >4 ?   K 3.4--> 27mQ PO ordered (UPDATE: per nursing patient refusing PO pill and packet, IV ordered) ?- Continuous pulse oximetry ? ? ?KNeomia GlassMHA, MSN, FNP-BC ?Nurse Practitioner ?[facility] ?[place] ?Pager [phone]? ?

## 2022-01-22 NOTE — Progress Notes (Signed)
?Progress Note ? ? ?Patient: Crystal Delacruz DOB: 09/21/1924 DOA: 01/19/2022     3 ?DOS: the patient was seen and examined on 01/22/2022 ?  ?Brief hospital course: ?Taken from prior notes. ? ?Crystal Delacruz is a 88 y.o. female with Past medical history off HTN, HLD, IDDM T2, hypothyroid, dementia AO x1, bilateral lower extremity edema, wheelchair-bound, as reviewed from EMR, presented to [facility] ED with hypoxic respiratory failure.  Family called EMS, patient was found to have oxygen saturation in 70s, patient was briefly placed on nonrebreather, which was later transitioned to 3 L of oxygen, and currently weaned back to 2 L which is her baseline. ? ?Patient has underlying dementia and oriented to self only, which is her baseline. ? ?ED Course: Tachypneic, respiratory failure on 3 L oxygen currently, mild hypertension. ?BMP mild hyperglycemia glucose 156, creatinine 1.44 at baseline CKD stage III ?BNP 287, lactic acid 1.7 within normal range WBC count 8.9 within normal range ?UA positive, urine culture and blood culture collected. ?CXR No significant change in patchy pulmonary opacities at both lung bases, favored to reflect scarring based on relative stability. No new airspace disease ?Ct stone: 1. Consolidative densities at the right lung base which may represent infiltrate and/or atelectasis, correlate clinically.  ?2. Single small focus of air in the urinary bladder which could be seen with infection, correlate with urinalysis. ?3. No nephrolithiasis or obstructive uropathy. ?4. Mild colonic diverticulosis. ?5. 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 Recommend annual imaging followup by CTA or MRA.  ? ?She was admitted for acute on chronic hypoxic respiratory failure, thought to be due to volume overload.  She received IV Lasix while in the hospital.  Respiratory viral panel was negative. ?There was a concern of pneumonia but there was no leukocytosis and she remained afebrile.  Not  much upper respiratory symptoms. ? ?She was also found to have UTI with urine culture growing Enterococcus, history of recurrent UTIs in the past few months.  Unable to explain much urinary symptoms due to underlying dementia. ?Patient received ceftriaxone initially, later transitioned to cefepime and vancomycin for concern of lower extremity cellulitis and discharged on Augmentin to complete the course. ? ?She was also noted to have bilateral lower extremity erythema and mild tenderness.  There was concern of cellulitis, patient did receive cefepime and vancomycin while in the hospital and discharged on Augmentin.  She can follow-up with PCP for further recommendations. ? ?Her creatinine remained stable during diuresis. ? ?Patient did developed some delirium which is very common in this age group and with underlying dementia. ? ? ? ? ?Assessment and Plan: ?* Respiratory failure with hypoxia ([place]) ?Now at baseline.  It was thought to be secondary to volume overload.  BNP slightly elevated on admission.  There was some concern of right lower lobe pneumonia on renal CT but patient did not had any leukocytosis or fever.  Procalcitonin was not checked but she received antibiotics and being switched to Augmentin to complete the course that should cover her cellulitis, if there is a pneumonia and UTI. ?She also received IV Lasix while in the hospital. ?-Switch IV Lasix with p.o. torsemide as she was taking at home ?-Echocardiogram done-pending results ?-Continue with incentive spirometry ? ?UTI (urinary tract infection) ?Patient with history of recurrent UTI in the past few months.  Unable to explain any symptoms due to underlying dementia.  Urine cultures with Enterococcus. ?She received ceftriaxone followed by cefepime and vancomycin while in  the hospital. ?-Switch antibiotics to Augmentin to complete the course-that will cover her UTI and a possible cellulitis. ? ?Cellulitis ?There was some concern of bilateral lower  extremity cellulitis, nonpurulent.  Might be just venous congestion but she received cefepime and vancomycin while in the hospital. ?Continue to have mild erythema with improvement in edema with diuresis. ?-Switch cefepime and vancomycin with Augmentin. ?-IV Lasix switched with p.o. torsemide ?-Echocardiogram done-pending results ? ?Hypothyroidism ?- Continue home dose of Synthroid ? ?Diabetes mellitus with stage 3 chronic kidney disease ([place]) ?CBG mildly elevated. ?-Continue with SSI ? ?CKD (chronic kidney disease), stage III ([place]) ?Creatinine appears to be at baseline. ?-Monitor renal function ?-Avoid nephrotoxins ? ?Hypokalemia ?Most likely secondary to diuretic use. ?Also has hypomagnesemia with magnesium of 1.6. ?-Replete potassium and magnesium and monitor ? ? ?Subjective: Patient was seen and examined today.  She was sitting on the side of bed and starting to work with PT.  She was oriented to self only.  Denies any pain.  Husband at bedside ? ?Physical Exam: ?Vitals:  ? 01/21/22 1929 01/22/22 0051 01/22/22 0800 01/22/22 1108  ?BP:  (!) 145/62 (!) 193/162 (!) 117/57  ?Pulse: 97 (!) 106 95 90  ?Resp: '16 20 18 20  '$ ?Temp: 98.8 ?F (37.1 ?C) 98.8 ?F (37.1 ?C) 98.2 ?F (36.8 ?C) 98 ?F (36.7 ?C)  ?TempSrc:   Oral Oral  ?SpO2: 93% 93% 93% 93%  ?Weight:      ?Height:      ? ?General.  Frail elderly lady, in no acute distress. ?Pulmonary.  Lungs clear bilaterally, normal respiratory effort. ?CV.  Regular rate and rhythm, no JVD, rub or murmur. ?Abdomen.  Soft, nontender, nondistended, BS positive. ?CNS.  Alert and oriented to self only.  No focal neurologic deficit. ?Extremities.  No edema, no cyanosis, pulses intact and symmetrical. ?Psychiatry.  Judgment and insight appears impaired. ? ?Data Reviewed: ?I personally reviewed prior notes, labs and images. ? ?Family Communication: Discussed with husband at bedside and daughter-in-law on phone. ? ?Disposition: ?Status is: Inpatient ?Remains inpatient appropriate because:  Severity of illness.  Most likely can be discharged tomorrow with home health services as family declined SNF. ? ? Planned Discharge Destination: Home with Home Health ? ?DVT prophylaxis.  Lovenox ? ?Time spent: 50 minutes ? ?This record has been created using Systems analyst. Errors have been sought and corrected,but may not always be located. Such creation errors do not reflect on the standard of care. ? ?Author: ?Lorella Nimrod, MD ?01/22/2022 1:57 PM ? ?For on call review [email or web address].  ?

## 2022-01-22 NOTE — Plan of Care (Signed)

## 2022-01-22 NOTE — Assessment & Plan Note (Addendum)
There was some concern of bilateral lower extremity cellulitis, nonpurulent.  Might be just venous congestion but she received cefepime and vancomycin while in the hospital. ?Continue to have mild erythema with improvement in edema with diuresis. ?-Switch cefepime and vancomycin with Augmentin. ?-IV Lasix switched with p.o. torsemide ?-Echocardiogram done-pending results ?

## 2022-01-22 NOTE — Assessment & Plan Note (Signed)
Patient with history of recurrent UTI in the past few months.  Unable to explain any symptoms due to underlying dementia.  Urine cultures with Enterococcus. ?She received ceftriaxone followed by cefepime and vancomycin while in the hospital. ?-Switch antibiotics to Augmentin to complete the course-that will cover her UTI and a possible cellulitis. ?

## 2022-01-22 NOTE — Assessment & Plan Note (Addendum)
Now at baseline.  It was thought to be secondary to volume overload.  BNP slightly elevated on admission.  There was some concern of right lower lobe pneumonia on renal CT but patient did not had any leukocytosis or fever.  Procalcitonin was not checked but she received antibiotics and being switched to Augmentin to complete the course that should cover her cellulitis, if there is a pneumonia and UTI. ?She also received IV Lasix while in the hospital. ?-Switch IV Lasix with p.o. torsemide as she was taking at home ?-Echocardiogram done-pending results ?-Continue with incentive spirometry ?

## 2022-01-22 NOTE — TOC Progression Note (Addendum)
Transition of Care (TOC) - Progression Note  ? ? ?Patient Details  ?Name: Crystal Delacruz ?MRN: [id number] ?Date of Birth: 1923-12-05 ? ?Transition of Care (TOC) CM/SW Contact  ?Pete Pelt, RN ?Phone Number: ?01/22/2022, 11:06 AM ? ?Clinical Narrative:   RNCM spoke to patient's son who states they still plan to take patient home. They have access to a hoyer but no hospital bed.  Son declines hospital bed at this time.  He states he will let us know if he needs one. ? ?[facility] confirms that they are able to accept patient's care at their agency as per [place].  ? ?RNCM will follow. ? ?Addendum 68 spouse and daughter in room.  [facility] contacted patient's son re: equipment.  Plan to DC tomorrow.  [facility] contacted they are able to take patient.  Family states they will discuss equipment and call [facility] back. ? ?Family would like private duty caregiver.   RNCM provided them with the number for [facility] will call. ? ?  ?Barriers to Discharge: Continued Medical Work up ? ?Expected Discharge Plan and Services ?  ?  ?  ?  ?  ?                ?  ?  ?  ?  ?  ?  ?[place] Agency: [facility] ([place]) ?Date HH Agency Contacted: 01/21/22 ?Time [place]: 8502 ?Representative spoke with at [place]: [facility] Flock ? ? ?Social Determinants of Health (SDOH) Interventions ?  ? ?Readmission Risk Interventions ?No flowsheet data found. ? ?

## 2022-01-22 NOTE — Progress Notes (Signed)
Physical Therapy Treatment ?Patient Details ?Name: Crystal Delacruz ?MRN: [id number] ?DOB: May 23, 1924 ?Today's Date: 01/22/2022 ? ? ?History of Present Illness presented to ER secondary to respiratory distress; admitted for management of acute hypoxic respiratory failure, bilat LE cellulitis, UTI. ? ?  ?PT Comments  ? ? Patient received in bed, she is alert, confused. Husband at bedside. She is more appropriate this session, cooperative, pleasant. Patient requires max cues for participation. She requires mod +2 assist for all mobility at this time. She was able to transfer bed to recliner and back to bed with RW. She will continue to benefit from skilled PT while here to improve mobility and safety to return home with family.  ?     ?Recommendations for follow up therapy are one component of a multi-disciplinary discharge planning process, led by the attending physician.  Recommendations may be updated based on patient status, additional functional criteria and insurance authorization. ? ?Follow Up Recommendations ? Home health PT ?  ?  ?Assistance Recommended at Discharge Frequent or constant Supervision/Assistance  ?Patient can return home with the following A lot of help with bathing/dressing/bathroom;Two people to help with walking and/or transfers;Help with stairs or ramp for entrance;Assist for transportation;Assistance with cooking/housework ?  ?Equipment Recommendations ? Hospital bed;Other (comment) (possibly transfer equipment)  ?  ?Recommendations for Other Services   ? ? ?  ?Precautions / Restrictions Precautions ?Precautions: Fall ?Restrictions ?Weight Bearing Restrictions: No  ?  ? ?Mobility ? Bed Mobility ?Overal bed mobility: Needs Assistance ?Bed Mobility: Supine to Sit, Sit to Supine ?  ?  ?Supine to sit: Mod assist, +2 for physical assistance ?Sit to supine: +2 for physical assistance, Mod assist ?  ?General bed mobility comments: Requires max cues and mod + 2 assist for bed mobility ?   ? ?Transfers ?Overall transfer level: Needs assistance ?Equipment used: Rolling walker (2 wheels) ?Transfers: Sit to/from Stand, Bed to chair/wheelchair/BSC ?Sit to Stand: Mod assist, +2 physical assistance, +2 safety/equipment ?Stand pivot transfers: Mod assist, +2 physical assistance, +2 safety/equipment ?  ?  ?  ?  ?General transfer comment: min/mod assist for sit to stand from bed and recliner. ?  ? ?Ambulation/Gait ?Ambulation/Gait assistance: Mod assist, +2 physical assistance, +2 safety/equipment ?Gait Distance (Feet): 4 Feet ?Assistive device: Rolling walker (2 wheels) ?Gait Pattern/deviations: Step-to pattern, Decreased step length - right, Decreased step length - left ?Gait velocity: decr ?  ?  ?General Gait Details: took steps from bed to recliner and back to bed. ? ? ?Stairs ?  ?  ?  ?  ?  ? ? ?Wheelchair Mobility ?  ? ?Modified Rankin (Stroke Patients Only) ?  ? ? ?  ?Balance Overall balance assessment: Needs assistance ?Sitting-balance support: Feet supported, Bilateral upper extremity supported ?Sitting balance-Leahy Scale: Poor ?Sitting balance - Comments: able to sit unsupported with close supervision ?  ?Standing balance support: Bilateral upper extremity supported, During functional activity, Reliant on assistive device for balance ?Standing balance-Leahy Scale: Fair ?Standing balance comment: requires +2 assist for safety with transfers ?  ?  ?  ?  ?  ?  ?  ?  ?  ?  ?  ?  ? ?  ?Cognition Arousal/Alertness: Awake/alert ?Behavior During Therapy: [facility] for tasks assessed/performed ?Overall Cognitive Status: History of cognitive impairments - at baseline ?Area of Impairment: Orientation, Safety/judgement, Awareness, Problem solving, Following commands, Memory ?  ?  ?  ?  ?  ?  ?  ?  ?Orientation Level: Disoriented to, Time, Situation ?Current  Attention Level: Focused ?Memory: Decreased recall of precautions, Decreased short-term memory ?Following Commands: Follows one step commands inconsistently,  Follows one step commands with increased time ?Safety/Judgement: Decreased awareness of safety, Decreased awareness of deficits ?Awareness: Intellectual ?Problem Solving: Slow processing, Decreased initiation, Difficulty sequencing, Requires verbal cues, Requires tactile cues ?General Comments: pleasantly confused this session. ?  ?  ? ?  ?Exercises   ? ?  ?General Comments   ?  ?  ? ?Pertinent Vitals/Pain Pain Assessment ?Pain Assessment: No/denies pain  ? ? ?Home Living   ?  ?  ?  ?  ?  ?  ?  ?  ?  ?   ?  ?Prior Function    ?  ?  ?   ? ?PT Goals (current goals can now be found in the care plan section) Acute Rehab PT Goals ?PT Goal Formulation: With patient/family ?Time For Goal Achievement: 02/03/22 ?Potential to Achieve Goals: Fair ?Progress towards PT goals: Progressing toward goals ? ?  ?Frequency ? ? ? Min 2X/week ? ? ? ?  ?PT Plan Discharge plan needs to be updated;Equipment recommendations need to be updated  ? ? ?Co-evaluation PT/OT/SLP Co-Evaluation/Treatment: Yes ?Reason for Co-Treatment: Necessary to address cognition/behavior during functional activity;To address functional/ADL transfers;For patient/therapist safety ?PT goals addressed during session: Mobility/safety with mobility;Balance ?  ?  ? ?  ?AM-PAC PT "6 Clicks" Mobility   ?Outcome Measure ? Help needed turning from your back to your side while in a flat bed without using bedrails?: A Lot ?Help needed moving from lying on your back to sitting on the side of a flat bed without using bedrails?: A Lot ?Help needed moving to and from a bed to a chair (including a wheelchair)?: A Lot ?Help needed standing up from a chair using your arms (e.g., wheelchair or bedside chair)?: A Lot ?Help needed to walk in hospital room?: Total ?Help needed climbing 3-5 steps with a railing? : Total ?6 Click Score: 10 ? ?  ?End of Session Equipment Utilized During Treatment: Gait belt;Oxygen ?Activity Tolerance: Other (comment) (confusion) ?Patient left: in bed;with  call bell/phone within reach;with bed alarm set;with family/visitor present ?Nurse Communication: Mobility status ?PT Visit Diagnosis: Muscle weakness (generalized) (M62.81);Difficulty in walking, not elsewhere classified (R26.2);Other abnormalities of gait and mobility (R26.89) ?  ? ? ?Time: 4982-6415 ?PT Time Calculation (min) (ACUTE ONLY): 24 min ? ?Charges:  $Therapeutic Activity: 8-22 mins          ?          ? ?[employer], PT, GCS ?01/22/22,1:01 PM ? ? ?

## 2022-01-22 NOTE — Progress Notes (Signed)
Occupational Therapy Treatment ?Patient Details ?Name: Crystal Delacruz ?MRN: [id number] ?DOB: 07-03-1924 ?Today's Date: 01/22/2022 ? ? ?History of present illness presented to ER secondary to respiratory distress; admitted for management of acute hypoxic respiratory failure, bilat LE cellulitis, UTI. ?  ?OT comments ? Pt seen for OT tx and co-tx with PT to work on ADL transfers. Pt alert, spouse present at start and end of session. Pt confused throughout, but able to follow simple commands with increased time and short simple repeated multimodal cues. Pt completed bed mobility with MOD A +2 and STS with RW with MOD A +2. Pt appears fearful of falling throughout, emotional support and active listening with gentle encouragement provided. Pt continues to benefit from skilled OT services. Continue to recommend STR at discharge. Per family/chart, family requesting to take pt home. Pt will benefit from a hospital bed, 2WW, w/c, BSC, and sit to stand lift if she is to return home with family, in addition to [facility] services.    ? ?Recommendations for follow up therapy are one component of a multi-disciplinary discharge planning process, led by the attending physician.  Recommendations may be updated based on patient status, additional functional criteria and insurance authorization. ?   ?Follow Up Recommendations ? Skilled nursing-short term rehab (<3 hours/day)  ?  ?Assistance Recommended at Discharge Frequent or constant Supervision/Assistance  ?Patient can return home with the following ? Two people to help with walking and/or transfers;Two people to help with bathing/dressing/bathroom;Assistance with cooking/housework;Direct supervision/assist for financial management;Help with stairs or ramp for entrance;Direct supervision/assist for medications management;Assist for transportation ?  ?Equipment Recommendations ? Hospital bed;BSC/3in1;Wheelchair cushion (measurements OT);Wheelchair (measurements OT);Other (comment) (sit to stand  lift)  ?  ?Recommendations for Other Services   ? ?  ?Precautions / Restrictions Precautions ?Precautions: Fall ?Restrictions ?Weight Bearing Restrictions: No  ? ? ?  ? ?Mobility Bed Mobility ?Overal bed mobility: Needs Assistance ?Bed Mobility: Supine to Sit, Sit to Supine ?  ?  ?Supine to sit: Mod assist, +2 for physical assistance ?Sit to supine: +2 for physical assistance, Mod assist ?  ?General bed mobility comments: Requires max cues and mod + 2 assist for bed mobility ?  ? ?Transfers ?Overall transfer level: Needs assistance ?Equipment used: Rolling walker (2 wheels) ?Transfers: Sit to/from Stand, Bed to chair/wheelchair/BSC ?Sit to Stand: Mod assist, +2 physical assistance, +2 safety/equipment ?Stand pivot transfers: Mod assist, +2 physical assistance, +2 safety/equipment ?  ?  ?  ?  ?General transfer comment: min/mod assist for sit to stand from bed and recliner. ?  ?  ?Balance Overall balance assessment: Needs assistance ?Sitting-balance support: Feet supported, Bilateral upper extremity supported ?Sitting balance-Leahy Scale: Poor ?Sitting balance - Comments: able to sit unsupported with close supervision ?  ?Standing balance support: Bilateral upper extremity supported, During functional activity, Reliant on assistive device for balance ?Standing balance-Leahy Scale: Fair ?Standing balance comment: requires +2 assist for safety with transfers ?  ?  ?  ?  ?  ?  ?  ?  ?  ?  ?  ?   ? ?ADL either performed or assessed with clinical judgement  ? ?ADL   ?  ?  ?  ?  ?  ?  ?  ?  ?  ?  ?  ?  ?  ?  ?  ?  ?  ?  ?  ?  ?  ? ?Extremity/Trunk Assessment   ?  ?  ?  ?  ?  ? ?Vision   ?  ?  ?  Perception   ?  ?Praxis   ?  ? ?Cognition Arousal/Alertness: Awake/alert ?Behavior During Therapy: [facility] for tasks assessed/performed, Anxious ?Overall Cognitive Status: History of cognitive impairments - at baseline ?  ?  ?  ?  ?  ?  ?  ?  ?  ?  ?  ?  ?  ?  ?  ?  ?General Comments: pleasantly confused this session, at times a little  anxious but calmed with encouragement from therapists ?  ?  ?   ?Exercises   ? ?  ?Shoulder Instructions   ? ? ?  ?General Comments    ? ? ?Pertinent Vitals/ Pain       Pain Assessment ?Pain Assessment: No/denies pain ? ?Home Living   ?  ?  ?  ?  ?  ?  ?  ?  ?  ?  ?  ?  ?  ?  ?  ?  ?  ?  ? ?  ?Prior Functioning/Environment    ?  ?  ?  ?   ? ?Frequency ? Min 2X/week  ? ? ? ? ?  ?Progress Toward Goals ? ?OT Goals(current goals can now be found in the care plan section) ? Progress towards OT goals: Progressing toward goals ? ?Acute Rehab OT Goals ?OT Goal Formulation: Patient unable to participate in goal setting  ?Plan Discharge plan remains appropriate;Frequency remains appropriate   ? ?Co-evaluation ? ? ? PT/OT/SLP Co-Evaluation/Treatment: Yes ?Reason for Co-Treatment: Necessary to address cognition/behavior during functional activity;For patient/therapist safety;To address functional/ADL transfers ?PT goals addressed during session: Mobility/safety with mobility;Balance ?OT goals addressed during session: ADL's and self-care ?  ? ?  ?AM-PAC OT "6 Clicks" Daily Activity     ?Outcome Measure ? ? Help from another person eating meals?: A Little ?Help from another person taking care of personal grooming?: A Lot ?Help from another person toileting, which includes using toliet, bedpan, or urinal?: Total ?Help from another person bathing (including washing, rinsing, drying)?: Total ?Help from another person to put on and taking off regular upper body clothing?: A Lot ?Help from another person to put on and taking off regular lower body clothing?: Total ?6 Click Score: 10 ? ?  ?End of Session   ? ?OT Visit Diagnosis: Unsteadiness on feet (R26.81);Muscle weakness (generalized) (M62.81) ?  ?Activity Tolerance Patient tolerated treatment well ?  ?Patient Left in bed;with call bell/phone within reach;with bed alarm set;with family/visitor present ?  ?Nurse Communication Other (comment) (TOC - AD/DME needs at discharge) ?  ? ?    ? ?Time: 1027-2536 ?OT Time Calculation (min): 26 min ? ?Charges: OT General Charges ?$OT Visit: 1 Visit ?OT Treatments ?$Self Care/Home Management : 8-22 mins ? ?Ardeth Perfect., MPH, MS, OTR/L ?ascom [phone] ?01/22/22, 2:12 PM ? ?

## 2022-01-22 NOTE — Progress Notes (Signed)
PHARMACIST - PHYSICIAN COMMUNICATION ? ?CONCERNING:  Enoxaparin (Lovenox) for DVT Prophylaxis  ? ? ?RECOMMENDATION: ?Patient was prescribed enoxaprin '40mg'$  q24 hours for VTE prophylaxis.  ? Danley Danker Weights  ? 01/19/22 1547  ?Weight: 82.8 kg (182 lb 8.7 oz)  ? ? ?Body mass index is 28.59 kg/m?. ? ?Estimated Creatinine Clearance: 35.5 mL/min (A) (by C-G formula based on SCr of 1.05 mg/dL (H)). ? ? ?Patient is candidate for enoxaparin '40mg'$  every 24 hours based on CrCl >1m/min  ? ?DESCRIPTION: ?[facility] ? ?Patient is now receiving enoxaparin 40 mg every 24 hours  ? ? ?ADarrick Penna PharmD ?Clinical Pharmacist  ?01/22/2022 ?9:39 AM ? ?

## 2022-01-22 NOTE — Assessment & Plan Note (Signed)
Creatinine appears to be at baseline. ?-Monitor renal function ?-Avoid nephrotoxins ?

## 2022-01-22 NOTE — Progress Notes (Signed)
Patient very confused all night. Continued to yell out "help me" and demanded to see a doctor. When asked what she needed help for she stated that she was dying and that "the baby bursted." Patient refused all PO meds throughout the night as she believed the nurse was drugging her and that she wanted to die happily and not "doped up" NP [place] made aware, IV Potassium ordered.  ?

## 2022-01-22 NOTE — Hospital Course (Addendum)
Taken from prior notes. ? ?Crystal Delacruz is a 88 y.o. female with Past medical history off HTN, HLD, IDDM T2, hypothyroid, dementia AO x1, bilateral lower extremity edema, wheelchair-bound, as reviewed from EMR, presented to [facility] ED with hypoxic respiratory failure.  Family called EMS, patient was found to have oxygen saturation in 70s, patient was briefly placed on nonrebreather, which was later transitioned to 3 L of oxygen, and currently weaned back to 2 L which is her baseline. ? ?Patient has underlying dementia and oriented to self only, which is her baseline. ? ?ED Course: Tachypneic, respiratory failure on 3 L oxygen currently, mild hypertension. ?BMP mild hyperglycemia glucose 156, creatinine 1.44 at baseline CKD stage III ?BNP 287, lactic acid 1.7 within normal range WBC count 8.9 within normal range ?UA positive, urine culture and blood culture collected. ?CXR No significant change in patchy pulmonary opacities at both lung bases, favored to reflect scarring based on relative stability. No new airspace disease ?Ct stone: 1. Consolidative densities at the right lung base which may represent infiltrate and/or atelectasis, correlate clinically.  ?2. Single small focus of air in the urinary bladder which could be seen with infection, correlate with urinalysis. ?3. No nephrolithiasis or obstructive uropathy. ?4. Mild colonic diverticulosis. ?5. 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 Recommend annual imaging followup by CTA or MRA.  ? ?She was admitted for acute on chronic hypoxic respiratory failure, thought to be due to volume overload.  She received IV Lasix while in the hospital.  Respiratory viral panel was negative. ?There was a concern of pneumonia but there was no leukocytosis and she remained afebrile.  Not much upper respiratory symptoms. ? ?She was also found to have UTI with urine culture growing Enterococcus, history of recurrent UTIs in the past few months.  Unable to  explain much urinary symptoms due to underlying dementia. ?Patient received ceftriaxone initially, later transitioned to cefepime and vancomycin for concern of lower extremity cellulitis and discharged on Augmentin to complete the course. ? ?She was also noted to have bilateral lower extremity erythema and mild tenderness.  There was concern of cellulitis, patient did receive cefepime and vancomycin while in the hospital and discharged on Augmentin.  She can follow-up with PCP for further recommendations. ? ?Her creatinine remained stable during diuresis. ? ?Patient did developed some delirium which is very common in this age group and with underlying dementia. ? ?3/21; patient appears more lethargic today.  Family still wants to take her back home and will provide the necessary care.  Initially decided to go home with palliative care, now thinking to go home with hospice services.  [facility] was notified and they will make arrangements for a potential discharge tomorrow. ? ?There was some overnight agitation, she was very pleasant and oriented to herself only when seen during morning rounds.  Pointing to her husband and saying that her dad is here? ?Otherwise medically stable to discharge home with hospice services. ? ?She is also being given antibiotics for a little longer duration for concern of frequent UTI.  She is also being discharged with Foley catheter which was replaced last night after failed voiding trial and urinary retention.  Unable to start Flomax due to sulfa allergies.  She needs to follow-up with urology as an outpatient for further recommendations. ? ?We also made her torsemide as needed as she appears dry and little worsening of creatinine. ?We restarted Seroquel at night for sundowning and overnight agitation. ?Amitriptyline was discontinued. ? ?  She will continue with rest of her medications except mentioned above and follow-up with her providers in hospice services. ? ?

## 2022-01-22 NOTE — Assessment & Plan Note (Signed)
CBG mildly elevated. ?-Continue with SSI ?

## 2022-01-22 NOTE — Care Management (Signed)
?  ?  Durable Medical Equipment  ?(From admission, onward)  ?  ? ? ?  ? ?  Start     Ordered  ? 01/22/22 1313  For home use only DME Hospital bed  Once       ?Question Answer Comment  ?Length of Need Lifetime   ?Patient has (list medical condition): Generalized   ?Bed type Semi-electric   ?Support Surface: Gel Overlay   ?  ? 01/22/22 1314  ? ?  ?  ? ?  ?  ?

## 2022-01-23 DIAGNOSIS — J9601 Acute respiratory failure with hypoxia: Secondary | ICD-10-CM | POA: Diagnosis not present

## 2022-01-23 DIAGNOSIS — Z7189 Other specified counseling: Secondary | ICD-10-CM

## 2022-01-23 DIAGNOSIS — L03119 Cellulitis of unspecified part of limb: Secondary | ICD-10-CM | POA: Diagnosis not present

## 2022-01-23 LAB — CBC
HCT: 39.1 % (ref 36.0–46.0)
Hemoglobin: 12.4 g/dL (ref 12.0–15.0)
MCH: 30.2 pg (ref 26.0–34.0)
MCHC: 31.7 g/dL (ref 30.0–36.0)
MCV: 95.1 fL (ref 80.0–100.0)
Platelets: 239 10*3/uL (ref 150–400)
RBC: 4.11 MIL/uL (ref 3.87–5.11)
RDW: 14.6 % (ref 11.5–15.5)
WBC: 10 10*3/uL (ref 4.0–10.5)
nRBC: 0 % (ref 0.0–0.2)

## 2022-01-23 LAB — BASIC METABOLIC PANEL
Anion gap: 10 (ref 5–15)
BUN: 16 mg/dL (ref 8–23)
CO2: 32 mmol/L (ref 22–32)
Calcium: 8 mg/dL — ABNORMAL LOW (ref 8.9–10.3)
Chloride: 95 mmol/L — ABNORMAL LOW (ref 98–111)
Creatinine, Ser: 1.15 mg/dL — ABNORMAL HIGH (ref 0.44–1.00)
GFR, Estimated: 44 mL/min — ABNORMAL LOW (ref >60)
Glucose, Bld: 172 mg/dL — ABNORMAL HIGH (ref 70–99)
Potassium: 3.1 mmol/L — ABNORMAL LOW (ref 3.5–5.1)
Sodium: 137 mmol/L (ref 135–145)

## 2022-01-23 LAB — GLUCOSE, CAPILLARY
Glucose-Capillary: 153 mg/dL — ABNORMAL HIGH (ref 70–99)
Glucose-Capillary: 138 mg/dL — ABNORMAL HIGH (ref 70–99)
Glucose-Capillary: 185 mg/dL — ABNORMAL HIGH (ref 70–99)
Glucose-Capillary: 171 mg/dL — ABNORMAL HIGH (ref 70–99)

## 2022-01-23 LAB — PHOSPHORUS: Phosphorus: 3 mg/dL (ref 2.5–4.6)

## 2022-01-23 LAB — MAGNESIUM: Magnesium: 2.2 mg/dL (ref 1.7–2.4)

## 2022-01-23 MED ORDER — POTASSIUM CHLORIDE CRYS ER 20 MEQ PO TBCR
40.0000 meq | EXTENDED_RELEASE_TABLET | Freq: Once | ORAL | Status: AC
  Administered 2022-01-23: 40 meq via ORAL
  Filled 2022-01-23: qty 2

## 2022-01-23 NOTE — Plan of Care (Signed)

## 2022-01-23 NOTE — TOC Progression Note (Signed)
Transition of Care (TOC) - Progression Note  ? ? ?Patient Details  ?Name: Crystal Delacruz ?MRN: [id number] ?Date of Birth: 1924/02/09 ? ?Transition of Care (TOC) CM/SW Contact  ?Pete Pelt, RN ?Phone Number: ?01/23/2022, 11:39 AM ? ?Clinical Narrative:   Patient will be cared for by [facility].  [facility] working to get hospital bed to house, in process as per Andee Poles ? ? ? ? ? ?  ?Barriers to Discharge: Continued Medical Work up ? ?Expected Discharge Plan and Services ?  ?  ?  ?  ?  ?                ?  ?  ?  ?  ?  ?  ?[place] Agency: [place] ([place]) ?Date HH Agency Contacted: 01/21/22 ?Time [place]: 1740 ?Representative spoke with at [place]: Floydene Flock ? ? ?Social Determinants of Health (SDOH) Interventions ?  ? ?Readmission Risk Interventions ?No flowsheet data found. ? ?

## 2022-01-23 NOTE — TOC Progression Note (Signed)
Transition of Care (TOC) - Progression Note  ? ? ?Patient Details  ?Name: Crystal Delacruz ?MRN: [id number] ?Date of Birth: 1924/01/07 ? ?Transition of Care (TOC) CM/SW Contact  ?Pete Pelt, RN ?Phone Number: ?01/23/2022, 3:29 PM ? ?Clinical Narrative:   Patient appeared lethargic in bed.  Daughter and patient's spouse were at bedside.  They state that they are still decided on taking patient home instead of going to a facility. ? ?Spouse, who is aged 86, states he cooks all meals and has been caring for patient to this point.  Daughter states that she is aware of this situation.  RNCM explained that family's understanding is that patient needed some assistance to walk into bathroom and her current needs have drastically changed.  RNCM reminded family that spouse will need a great deal of assistance, even with home health. ? ?RNCM explained that patient will be a great deal of care at home as she is a +2 assist and requires a hoyer as per therapy.  Family stated they have access to a hoyer and they are able to use it.  RNCM reviewed that it will take 2 people who are trained to use the lift to safely use the lift.  Family states they are able to have 2 people and they can access training, did not specify how or where.  Care team, including Physical Therapy made aware to consult with patient and family about hoyer lift. ? ?Family has spoken to palliative care and state that they did not find their services to be helpful.  They have requested [facility] to consult with them at this time.  Lorayne Bender from [employer] notified and will speak with family. ? ? ? ? ? ?  ?Barriers to Discharge: Continued Medical Work up ? ?Expected Discharge Plan and Services ?  ?  ?  ?  ?  ?                ?  ?  ?  ?  ?  ?  ?[place] Agency: [place] ([place]) ?Date HH Agency Contacted: 01/21/22 ?Time [place]: 8527 ?Representative spoke with at [place]: Floydene Flock ? ? ?Social Determinants of Health (SDOH) Interventions ?   ? ?Readmission Risk Interventions ?No flowsheet data found. ? ?

## 2022-01-23 NOTE — Progress Notes (Signed)
?Progress Note ? ? ?Patient: Crystal Delacruz RPR:[id number] DOB: 31-Jan-1924 DOA: 01/19/2022     4 ?DOS: the patient was seen and examined on 01/23/2022 ?  ?Brief hospital course: ?Taken from prior notes. ? ?Crystal Delacruz is a 88 y.o. female with Past medical history off HTN, HLD, IDDM T2, hypothyroid, dementia AO x1, bilateral lower extremity edema, wheelchair-bound, as reviewed from EMR, presented to [facility] ED with hypoxic respiratory failure.  Family called EMS, patient was found to have oxygen saturation in 70s, patient was briefly placed on nonrebreather, which was later transitioned to 3 L of oxygen, and currently weaned back to 2 L which is her baseline. ? ?Patient has underlying dementia and oriented to self only, which is her baseline. ? ?ED Course: Tachypneic, respiratory failure on 3 L oxygen currently, mild hypertension. ?BMP mild hyperglycemia glucose 156, creatinine 1.44 at baseline CKD stage III ?BNP 287, lactic acid 1.7 within normal range WBC count 8.9 within normal range ?UA positive, urine culture and blood culture collected. ?CXR No significant change in patchy pulmonary opacities at both lung bases, favored to reflect scarring based on relative stability. No new airspace disease ?Ct stone: 1. Consolidative densities at the right lung base which may represent infiltrate and/or atelectasis, correlate clinically.  ?2. Single small focus of air in the urinary bladder which could be seen with infection, correlate with urinalysis. ?3. No nephrolithiasis or obstructive uropathy. ?4. Mild colonic diverticulosis. ?5. 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 Recommend annual imaging followup by CTA or MRA.  ? ?She was admitted for acute on chronic hypoxic respiratory failure, thought to be due to volume overload.  She received IV Lasix while in the hospital.  Respiratory viral panel was negative. ?There was a concern of pneumonia but there was no leukocytosis and she remained afebrile.  Not  much upper respiratory symptoms. ? ?She was also found to have UTI with urine culture growing Enterococcus, history of recurrent UTIs in the past few months.  Unable to explain much urinary symptoms due to underlying dementia. ?Patient received ceftriaxone initially, later transitioned to cefepime and vancomycin for concern of lower extremity cellulitis and discharged on Augmentin to complete the course. ? ?She was also noted to have bilateral lower extremity erythema and mild tenderness.  There was concern of cellulitis, patient did receive cefepime and vancomycin while in the hospital and discharged on Augmentin.  She can follow-up with PCP for further recommendations. ? ?Her creatinine remained stable during diuresis. ? ?Patient did developed some delirium which is very common in this age group and with underlying dementia. ? ?3/21; patient appears more lethargic today.  Family still wants to take her back home and will provide the necessary care.  Initially decided to go home with palliative care, now thinking to go home with hospice services.  [facility] was notified and they will make arrangements for a potential discharge tomorrow. ? ? ? ? ?Assessment and Plan: ?* Respiratory failure with hypoxia ([place]) ?Now at baseline.  It was thought to be secondary to volume overload.  BNP slightly elevated on admission.  There was some concern of right lower lobe pneumonia on renal CT but patient did not had any leukocytosis or fever.  Procalcitonin was not checked but she received antibiotics and being switched to Augmentin to complete the course that should cover her cellulitis, if there is a pneumonia and UTI. ?She also received IV Lasix while in the hospital. ?-Switch IV Lasix with p.o. torsemide as  she was taking at home ?-Echocardiogram was within normal limits. ?-Continue with incentive spirometry ? ?UTI (urinary tract infection) ?Patient with history of recurrent UTI in the past few months.  Unable to explain any  symptoms due to underlying dementia.  Urine cultures with Enterococcus. ?She received ceftriaxone followed by cefepime and vancomycin while in the hospital. ?-Switch antibiotics to Augmentin to complete the course-that will cover her UTI and a possible cellulitis. ? ?Cellulitis ?There was some concern of bilateral lower extremity cellulitis, nonpurulent.  Might be just venous congestion but she received cefepime and vancomycin while in the hospital. ?Continue to have mild erythema with improvement in edema with diuresis. ?-Switch cefepime and vancomycin with Augmentin. ?-IV Lasix switched with p.o. torsemide ?-Echocardiogram done-pending results ? ?Hypothyroidism ?- Continue home dose of Synthroid ? ?Diabetes mellitus with stage 3 chronic kidney disease ([place]) ?CBG mildly elevated. ?-Continue with SSI ? ?CKD (chronic kidney disease), stage III ([place]) ?Creatinine appears to be at baseline. ?-Monitor renal function ?-Avoid nephrotoxins ? ?Hypokalemia ?Most likely secondary to diuretic use. ?Hypomagnesemia improved. ?-Replete potassium  and monitor ? ? ?Subjective: Patient appears little confused and more lethargic when seen today.  Husband at bedside.  Denies any pain. ? ?Physical Exam: ?Vitals:  ? 01/23/22 0452 01/23/22 0745 01/23/22 0819 01/23/22 1338  ?BP: (!) 110/58 (!) 87/53 94/60 102/62  ?Pulse: 96 (!) 102 98 99  ?Resp:  '16 16 16  '$ ?Temp:      ?TempSrc:      ?SpO2:  (!) 85% 94% 95%  ?Weight:      ?Height:      ? ?General.  Ill-appearing elderly lady, in no acute distress. ?Pulmonary.  Lungs clear bilaterally, normal respiratory effort. ?CV.  Regular rate and rhythm, no JVD, rub or murmur. ?Abdomen.  Soft, nontender, nondistended, BS positive. ?CNS.  Lethargic, no apparent focal deficit ?Extremities.  No edema, no cyanosis, pulses intact and symmetrical. ?Psychiatry.  Judgment and insight appears impaired ? ?Data Reviewed: ?Prior notes and labs reviewed ? ?Family Communication: Discussed with husband at  bedside ? ?Disposition: ?Status is: Inpatient ?Remains inpatient appropriate because: Severity of illness.  Most likely be going home with home hospice tomorrow. ? ? Planned Discharge Destination: Home with Home Health  ? ?DVT prophylaxis.  Lovenox ? ?Time spent: 45 minutes ? ?This record has been created using Systems analyst. Errors have been sought and corrected,but may not always be located. Such creation errors do not reflect on the standard of care. ? ?Author: ?Lorella Nimrod, MD ?01/23/2022 3:44 PM ? ?For on call review [email or web address].  ?

## 2022-01-23 NOTE — Assessment & Plan Note (Signed)
Now at baseline.  It was thought to be secondary to volume overload.  BNP slightly elevated on admission.  There was some concern of right lower lobe pneumonia on renal CT but patient did not had any leukocytosis or fever.  Procalcitonin was not checked but she received antibiotics and being switched to Augmentin to complete the course that should cover her cellulitis, if there is a pneumonia and UTI. ?She also received IV Lasix while in the hospital. ?-Switch IV Lasix with p.o. torsemide as she was taking at home ?-Echocardiogram was within normal limits. ?-Continue with incentive spirometry ?

## 2022-01-23 NOTE — Progress Notes (Signed)
[facility]) Hospital Liaison Note ?  ?Received request from Transitions of Care Manager, Jiles Garter, for hospice services at home after discharge. Chart and patient information under review by [facility] physician. Hospice eligibility pending. ?  ?Spoke with Kiki Bivens: [phone] & DIL/Kathie Tourangeau [phone] to initiate education related to hospice philosophy, services, and team approach to care. Lengthy discussion had at bedside with daughter-in-law and husband. MSW emphasized that hospice does not provide 24 hour caregivers but does provide 24 hour On Call services. MSW encouraged family to begin seeking additional support as family plans to have patient discharged tomorrow, 3/22. Family reports that in the event that they cannot find additional caregivers by discharge tomorrow that they are comfortable caring for a patient 24/7 until additional support can be found. Family reports that they have a Hoyer lift that was donated by a [employer] and are comfortable utilizing the lift independently. MSW emphasized the length of hospice support visits, emphasizing that visit frequency/length is dependent on patients needs but around the clock care will not be provided by [facility]. Family discussed at length alternate options in the event that they cannot care of her patient at home and MSW provided education of services provided in LTC setting/IPUfor patients reaching end of life. Family declined to pursue these options and preferred for patient to return home. Both verbalized understanding of information given. Per discussion, the plan is for patient to discharge home via [facility] once cleared to DC (anticipated DC 3.22).  ?  ?DME needs discussed. Patient has the following equipment in the home ?([employer]pt): ?Hospital Bed ?Hoyer Lift (donated) ?Patient requests the following equipment for delivery: ?Oxygen (2L) ?BSC (family requested the widest option that we have as they struggle to assist patient in  the bathroom after a BM)  ?bedside table ?Shower bench w/ back ?CHUX  ? ?Address verified and is correct in the chart. Janann Colonel is the family member to contact to arrange time of equipment delivery.  ?  ?Please send signed and completed DNR home with patient/family. Please provide prescriptions at discharge as needed to ensure ongoing symptom management.  ?  ?[facility] information and contact numbers given to family & above information shared with TOC. ?  ?Please call with any questions/concerns.  ?  ?Thank you for the opportunity to participate in this patient's care. ?  ?Daphene Calamity, MSW ?[place]  ?[phone] ?Daphene Calamity,  ?

## 2022-01-23 NOTE — Consult Note (Signed)
? ?                                                                                ?Consultation Note ?Date: 01/23/2022  ? ?Patient Name: Crystal Delacruz  ?DOB: [birth year]  MRN: [id number]  Age / Sex: 88 y.o., female  ?PCP: Kirk Ruths, MD ?Referring Physician: Lorella Nimrod, MD ? ?Reason for Consultation: Establishing goals of care ? ?HPI/Patient Profile: Crystal Delacruz is a 88 y.o. female with Past medical history off HTN, HLD, IDDM T2, hypothyroid, dementia AO x1, bilateral lower extremity edema, wheelchair-bound, as reviewed from EMR, presented to [facility] ED with hypoxic respiratory failure.  Family called EMS, patient was found to have oxygen saturation in 70s, patient was briefly placed on nonrebreather, which was later transitioned to 3 L of oxygen, and currently weaned back to 2 L which is her baseline. ?  ?Clinical Assessment and Goals of Care: ?Requested by care team to speak with patient and family about palliative vs hospice care; plans for discharge home today. Notes and labs reviewed. ? ?Patient is resting in bed. She is very confused. Her DIL is at bedside and states she spends most of her time in bed. Her baseline cognitive function she states is a bit better than it is now. She requests to know difference between hospice and palliative- and what they can offer.  Discussed a comfort path vs continued aggressive care. She does not believe they would want to return to the hospital but is unsure. Some questions were specific to the agency and to [facility], I was unable to answer. DIL would like to continue current [facility] plans and have palliative to follow at home with transition to hospice when they are ready.  ?  ? ?SUMMARY OF RECOMMENDATIONS   ?Recommend outpatient palliative to follow with [facility] that has been arranged.  ? ?Prognosis:  ?< 6 months ? ? ? ?  ? ?Primary Diagnoses: ?Present on Admission: ? Respiratory failure with hypoxia ([place]) ? UTI (urinary tract infection) ? Diabetes mellitus with stage 3 chronic  kidney disease ([place]) ? ? ?I have reviewed the medical record, interviewed the patient and family, and examined the patient. The following aspects are pertinent. ? ?Past Medical History:  ?Diagnosis Date  ? Arthritis   ? Asthma   ? Diabetes mellitus without complication ([place])   ? Hypertension   ? Hypothyroidism   ? Squamous cell carcinoma of skin 09/29/2018  ? L lower leg  ? Squamous cell carcinoma of skin 01/07/2020  ? R mid dorsum forearm  ? Squamous cell carcinoma of skin 12/19/2020  ? Left forearm (in situ), EDC  ? Squamous cell carcinoma of skin 06/21/2021  ? right bicep, EDC  ? Squamous cell carcinoma of skin 06/21/2021  ? right mid dorsum forearm, EDC  ? ?Social History  ? ?Socioeconomic History  ? Marital status: Married  ?  Spouse name: Not on file  ? Number of children: Not on file  ? Years of education: Not on file  ? Highest education level: Not on file  ?Occupational History  ? Occupation: retired  ?Tobacco Use  ? Smoking status: Never  ? Smokeless tobacco: Never  ?  Substance and Sexual Activity  ? Alcohol use: No  ? Drug use: Never  ? Sexual activity: Not Currently  ?Other Topics Concern  ? Not on file  ?Social History Narrative  ? Not on file  ? ?Social Determinants of Health  ? ?Financial Resource Strain: Not on file  ?Food Insecurity: Not on file  ?Transportation Needs: Not on file  ?Physical Activity: Not on file  ?Stress: Not on file  ?Social Connections: Not on file  ? ?History reviewed. No pertinent family history. ?Scheduled Meds: ? amoxicillin-clavulanate  1 tablet Oral Q12H  ? aspirin EC  81 mg Oral Daily  ? Chlorhexidine Gluconate Cloth  6 each Topical Daily  ? enoxaparin (LOVENOX) injection  40 mg Subcutaneous Q24H  ? feeding supplement (GLUCERNA SHAKE)  237 mL Oral TID BM  ? gabapentin  200 mg Oral TID  ? glimepiride  4 mg Oral Q breakfast  ? insulin aspart  0-15 Units Subcutaneous TID WC  ? levothyroxine  50 mcg Oral Daily  ? metoprolol succinate  25 mg Oral BID  ? sodium chloride flush  3  mL Intravenous Q12H  ? ?Continuous Infusions: ? sodium chloride 250 mL (01/19/22 1955)  ? ?PRN Meds:.sodium chloride, acetaminophen **OR** acetaminophen, bisacodyl, ondansetron **OR** ondansetron (ZOFRAN) IV, oxyCODONE, sodium chloride flush ?Medications Prior to Admission:  ?Prior to Admission medications   ?Medication Sig Start Date End Date Taking? Authorizing Provider  ?aspirin EC 81 MG tablet Take 81 mg by mouth daily.   Yes [provider]  ?gabapentin (NEURONTIN) 100 MG capsule Take 200 mg by mouth 3 (three) times daily. 07/31/18  Yes [provider]  ?glimepiride (AMARYL) 4 MG tablet Take 4 mg by mouth in the morning and at bedtime. 10/20/21  Yes [provider]  ?LANTUS SOLOSTAR 100 UNIT/ML Solostar Pen Inject 15 Units into the skin daily. 09/07/21  Yes [provider]  ?levothyroxine (SYNTHROID, LEVOTHROID) 50 MCG tablet Take 50 mcg by mouth daily. 08/08/18  Yes [provider]  ?metoprolol succinate (TOPROL-XL) 25 MG 24 hr tablet Take 25 mg by mouth 2 (two) times daily. 01/03/22  Yes [provider]  ?Multiple Vitamins-Minerals (PRESERVISION AREDS 2 PO) Take 1 tablet by mouth in the morning and at bedtime.   Yes [provider]  ?nortriptyline (PAMELOR) 25 MG capsule Take 25 mg by mouth 2 (two) times daily. 10/13/18  Yes [provider]  ?torsemide (DEMADEX) 20 MG tablet Take 40 mg by mouth 3 (three) times daily. 09/05/18  Yes [provider]  ?acetaminophen (TYLENOL) 500 MG tablet Take 500 mg by mouth every 6 (six) hours as needed.    [provider]  ?QUEtiapine (SEROQUEL) 25 MG tablet Take 1 tablet by mouth in the morning and at bedtime. ?Patient not taking: Reported on 01/19/2022 12/09/21   [provider]  ? ?Allergies  ?Allergen Reactions  ? Elemental Sulfur   ? Latex Swelling  ? Morphine And Related   ? ?Review of Systems  ?Unable to perform ROS ? ?Physical Exam ?Pulmonary:  ?   Effort: Pulmonary effort is  normal.  ?Neurological:  ?   Mental Status: She is alert.  ?   Comments: Confused.   ? ? ?Vital Signs: BP 102/62 (BP Location: Right Arm)   Pulse 99   Temp (!) 97.4 ?F (36.3 ?C) (Oral)   Resp 16   Ht '5\' 7"'$  (1.702 m)   Wt 82.8 kg   SpO2 95%   BMI 28.59 kg/m?  ?Pain  Scale: 0-10 ?POSS *See Group Information*: 1-Acceptable,Awake and alert ?Pain Score: 0-No pain ? ? ?SpO2: SpO2: 95 % ?O2 Device:SpO2: 95 % ?O2 Flow Rate: .O2 Flow Rate (L/min): 2 L/min ? ?IO: Intake/output summary:  ?Intake/Output Summary (Last 24 hours) at 01/23/2022 1441 ?Last data filed at 01/23/2022 0900 ?Gross per 24 hour  ?Intake 580 ml  ?Output 3200 ml  ?Net -2620 ml  ? ? ?LBM: Last BM Date : 01/22/22 ?Baseline Weight: Weight: 82.8 kg ?Most recent weight: Weight: 82.8 kg     ? ?40 min ? ?Signed by: ?Asencion Gowda, NP ?  ?Please contact Palliative Medicine Team phone at [phone] for questions and concerns.  ?For individual provider: See Amion ? ? ? ? ? ? ? ? ? ? ? ? ? ?

## 2022-01-23 NOTE — Assessment & Plan Note (Signed)
Most likely secondary to diuretic use. ?Hypomagnesemia improved. ?-Replete potassium  and monitor ?

## 2022-01-23 NOTE — Progress Notes (Signed)
Physical Therapy Treatment ?Patient Details ?Name: Crystal Delacruz ?MRN: [id number] ?DOB: 1924/03/05 ?Today's Date: 01/23/2022 ? ? ?History of Present Illness 88 y/o female who presented to ER secondary to respiratory distress; admitted for management of acute hypoxic respiratory failure, bilat LE cellulitis, UTI. ? ?  ?PT Comments  ? ? Pt pleasantly confused t/o the session, some anxiousness as well.  She was, however, able to participate with taking a few steps from bed to recliner but showed poor safety awareness and had multiple bouts of buckling/trying to sit (?) while transitioning from bed to recliner that required heavy assist to insure she maintained upright until square to the recliner.  Encouraged further standing/ambulation effort but pt too anxious and confused to participate and/or make this a safe proposition.  She showed limited ability to initiate movement and PT had considerable discussion with husband/caregiver about concerns for her ability to remain active even in her limited baseline state due to cognition and safety awareness.  Questions answered, gait belt issued.     ?Recommendations for follow up therapy are one component of a multi-disciplinary discharge planning process, led by the attending physician.  Recommendations may be updated based on patient status, additional functional criteria and insurance authorization. ? ?Follow Up Recommendations ?  (family determinted to take her home) ?  ?  ?Assistance Recommended at Discharge Frequent or constant Supervision/Assistance  ?Patient can return home with the following A lot of help with bathing/dressing/bathroom;Two people to help with walking and/or transfers;Help with stairs or ramp for entrance;Assist for transportation;Assistance with cooking/housework ?  ?Equipment Recommendations ? Hospital bed  ?  ?Recommendations for Other Services   ? ? ?  ?Precautions / Restrictions Precautions ?Precautions: Fall ?Restrictions ?Weight Bearing  Restrictions: No  ?  ? ?Mobility ? Bed Mobility ?Overal bed mobility: Needs Assistance ?Bed Mobility: Supine to Sit ?  ?  ?Supine to sit: Mod assist, Max assist, +2 for physical assistance ?  ?  ?General bed mobility comments: repeated multi modal cuing for trying to initiate anymovement toward sitting/EOB, pt smiling but unable to initate any movement, did not appear to assist once +2 assist started helping with transition ?  ? ?Transfers ?Overall transfer level: Needs assistance ?Equipment used: Rolling walker (2 wheels) ?Transfers: Sit to/from Stand ?Sit to Stand: Mod assist, +2 physical assistance ?  ?  ?  ?  ?  ?General transfer comment: standard height bed, plenty of cuing (verbal and tactile) for set up. Again pt did not initiate movement with verbal cuing but did start to assist once +2 assist given to start transition to standing into walker. ?  ? ?Ambulation/Gait ?Ambulation/Gait assistance: Max assist, +2 physical assistance ?Gait Distance (Feet): 3 Feet ?Assistive device: Rolling walker (2 wheels) ?  ?  ?  ?  ?General Gait Details: Pt very confused about taking a few steps to recliner despite repeated encouaragement and cuing.  I the brief turning transition she had 2 episodes of buckling/attempting to sit before she was to the recliner and needed heavy +2 assist to maintain upright.  Pt with very poor awareness, she appeared to have the phyiscal strength to do more and PT encouraged attempt to take a few forward steps once we turned 90* to get square to recliner; this did not translate and sit despite cuing to remain standing and try more she did not and rapidly sat ? ? ?Stairs ?  ?  ?  ?  ?  ? ? ?Wheelchair Mobility ?  ? ?Modified Rankin (  Stroke Patients Only) ?  ? ? ?  ?Balance Overall balance assessment: Needs assistance ?Sitting-balance support: Feet supported, Bilateral upper extremity supported ?Sitting balance-Leahy Scale: Fair ?  ?  ?Standing balance support: Bilateral upper extremity supported,  During functional activity, Reliant on assistive device for balance ?Standing balance-Leahy Scale: Poor ?Standing balance comment: mental status appears biggest balance/safety issue regarding functional standing ?  ?  ?  ?  ?  ?  ?  ?  ?  ?  ?  ?  ? ?  ?Cognition Arousal/Alertness: Awake/alert ?Behavior During Therapy: [facility] for tasks assessed/performed, Anxious ?Overall Cognitive Status: History of cognitive impairments - at baseline ?Area of Impairment: Orientation, Safety/judgement, Awareness, Problem solving, Following commands, Memory ?  ?  ?  ?  ?  ?  ?  ?  ?  ?  ?  ?  ?  ?  ?  ?General Comments: Pt plesanty confused, anxious t/o session ?  ?  ? ?  ?Exercises   ? ?  ?General Comments General comments (skin integrity, edema, etc.): Pt very limited with ability to follow cuing, poor awareness ?  ?  ? ?Pertinent Vitals/Pain Pain Assessment ?Pain Assessment: Faces ?Faces Pain Scale: No hurt (does not indicate pain)  ? ? ?Home Living   ?  ?  ?  ?  ?  ?  ?  ?  ?  ?   ?  ?Prior Function    ?  ?  ?   ? ?PT Goals (current goals can now be found in the care plan section) Progress towards PT goals: Not progressing toward goals - comment (mental status) ? ?  ?Frequency ? ? ? Min 2X/week ? ? ? ?  ?PT Plan Current plan remains appropriate  ? ? ?Co-evaluation   ?  ?  ?  ?  ? ?  ?AM-PAC PT "6 Clicks" Mobility   ?Outcome Measure ? Help needed turning from your back to your side while in a flat bed without using bedrails?: A Lot ?Help needed moving from lying on your back to sitting on the side of a flat bed without using bedrails?: A Lot ?Help needed moving to and from a bed to a chair (including a wheelchair)?: A Lot ?Help needed standing up from a chair using your arms (e.g., wheelchair or bedside chair)?: A Lot ?Help needed to walk in hospital room?: Total ?Help needed climbing 3-5 steps with a railing? : Total ?6 Click Score: 10 ? ?  ?End of Session   ?  ?  ?  ?PT Visit Diagnosis: Muscle weakness (generalized)  (M62.81);Difficulty in walking, not elsewhere classified (R26.2);Other abnormalities of gait and mobility (R26.89) ?  ? ? ?Time: 5277-8242 ?PT Time Calculation (min) (ACUTE ONLY): 28 min ? ?Charges:  $Therapeutic Activity: 23-37 mins          ?          ? ?Kreg Shropshire, DPT ?01/23/2022, 5:02 PM ? ?

## 2022-01-23 NOTE — Progress Notes (Signed)
Indwelling cath removed per protocol. 3 days post insertion for retention. Monitoring for output. Output expected prior to 0800 a.m. ?

## 2022-01-24 DIAGNOSIS — L03119 Cellulitis of unspecified part of limb: Secondary | ICD-10-CM | POA: Diagnosis not present

## 2022-01-24 DIAGNOSIS — J9601 Acute respiratory failure with hypoxia: Secondary | ICD-10-CM | POA: Diagnosis not present

## 2022-01-24 DIAGNOSIS — R339 Retention of urine, unspecified: Secondary | ICD-10-CM

## 2022-01-24 DIAGNOSIS — N3090 Cystitis, unspecified without hematuria: Secondary | ICD-10-CM

## 2022-01-24 DIAGNOSIS — E1122 Type 2 diabetes mellitus with diabetic chronic kidney disease: Secondary | ICD-10-CM | POA: Diagnosis not present

## 2022-01-24 DIAGNOSIS — N3000 Acute cystitis without hematuria: Secondary | ICD-10-CM

## 2022-01-24 DIAGNOSIS — N183 Chronic kidney disease, stage 3 unspecified: Secondary | ICD-10-CM

## 2022-01-24 DIAGNOSIS — E876 Hypokalemia: Secondary | ICD-10-CM

## 2022-01-24 LAB — BASIC METABOLIC PANEL
Anion gap: 10 (ref 5–15)
BUN: 20 mg/dL (ref 8–23)
CO2: 31 mmol/L (ref 22–32)
Calcium: 8.4 mg/dL — ABNORMAL LOW (ref 8.9–10.3)
Chloride: 98 mmol/L (ref 98–111)
Creatinine, Ser: 1.41 mg/dL — ABNORMAL HIGH (ref 0.44–1.00)
GFR, Estimated: 34 mL/min — ABNORMAL LOW (ref >60)
Glucose, Bld: 188 mg/dL — ABNORMAL HIGH (ref 70–99)
Potassium: 3.9 mmol/L (ref 3.5–5.1)
Sodium: 139 mmol/L (ref 135–145)

## 2022-01-24 LAB — CBC
HCT: 39.9 % (ref 36.0–46.0)
Hemoglobin: 12.7 g/dL (ref 12.0–15.0)
MCH: 29.8 pg (ref 26.0–34.0)
MCHC: 31.8 g/dL (ref 30.0–36.0)
MCV: 93.7 fL (ref 80.0–100.0)
Platelets: 282 10*3/uL (ref 150–400)
RBC: 4.26 MIL/uL (ref 3.87–5.11)
RDW: 14.8 % (ref 11.5–15.5)
WBC: 8.9 10*3/uL (ref 4.0–10.5)
nRBC: 0 % (ref 0.0–0.2)

## 2022-01-24 LAB — CULTURE, BLOOD (ROUTINE X 2)
Culture: NO GROWTH
Culture: NO GROWTH
Special Requests: ADEQUATE

## 2022-01-24 LAB — GLUCOSE, CAPILLARY
Glucose-Capillary: 166 mg/dL — ABNORMAL HIGH (ref 70–99)
Glucose-Capillary: 165 mg/dL — ABNORMAL HIGH (ref 70–99)
Glucose-Capillary: 171 mg/dL — ABNORMAL HIGH (ref 70–99)

## 2022-01-24 MED ORDER — AMOXICILLIN-POT CLAVULANATE 500-125 MG PO TABS
1.0000 | ORAL_TABLET | Freq: Two times a day (BID) | ORAL | Status: DC
  Administered 2022-01-24: 500 mg via ORAL
  Filled 2022-01-24 (×2): qty 1

## 2022-01-24 MED ORDER — OXYCODONE HCL 5 MG PO TABS: 5.0000 mg | ORAL_TABLET | ORAL | 0 refills | Status: AC | PRN

## 2022-01-24 MED ORDER — TORSEMIDE 20 MG PO TABS: 40.0000 mg | ORAL_TABLET | Freq: Every day | ORAL | 2 refills | Status: DC | PRN

## 2022-01-24 MED ORDER — GLUCERNA SHAKE PO LIQD: 237.0000 mL | Freq: Three times a day (TID) | ORAL | 12 refills | Status: AC

## 2022-01-24 MED ORDER — ONDANSETRON HCL 4 MG PO TABS: 4.0000 mg | ORAL_TABLET | Freq: Four times a day (QID) | ORAL | 0 refills | Status: AC | PRN

## 2022-01-24 MED ORDER — AMOXICILLIN-POT CLAVULANATE 500-125 MG PO TABS
1.0000 | ORAL_TABLET | Freq: Two times a day (BID) | ORAL | 0 refills | Status: AC
Start: 2022-01-24 — End: 2022-01-29

## 2022-01-24 MED ORDER — BISACODYL 10 MG RE SUPP: 10.0000 mg | Freq: Every day | RECTAL | 0 refills | Status: AC | PRN

## 2022-01-24 MED ORDER — QUETIAPINE FUMARATE 25 MG PO TABS: 25.0000 mg | ORAL_TABLET | Freq: Every day | ORAL | 1 refills | Status: AC

## 2022-01-24 MED ORDER — ENOXAPARIN SODIUM 30 MG/0.3ML IJ SOSY: 30.0000 mg | PREFILLED_SYRINGE | INTRAMUSCULAR | Status: DC

## 2022-01-24 NOTE — Discharge Summary (Signed)
?Physician Discharge Summary ?  ?Patient: Crystal Delacruz MRN: [id number] DOB: [birth year]  ?Admit date:     01/19/2022  ?Discharge date: 01/24/22  ?Discharge Physician: Lorella Nimrod  ? ?PCP: Kirk Ruths, MD  ? ?Recommendations at discharge:  ?Follow-up with urology to discuss further need of Foley catheter and concern of frequent UTI ?Follow-up with primary care provider ? ?Discharge Diagnoses: ?Principal Problem: ?  Respiratory failure with hypoxia ([place]) ?Active Problems: ?  UTI (urinary tract infection) ?  Cellulitis ?  Hypothyroidism ?  Diabetes mellitus with stage 3 chronic kidney disease ([place]) ?  CKD (chronic kidney disease), stage III ([place]) ?  Hypokalemia ?  Cystitis ?  Urinary retention ? ? ?Hospital Course: ?Taken from prior notes. ? ?Crystal Delacruz is a 88 y.o. female with Past medical history off HTN, HLD, IDDM T2, hypothyroid, dementia AO x1, bilateral lower extremity edema, wheelchair-bound, as reviewed from EMR, presented to [facility] ED with hypoxic respiratory failure.  Family called EMS, patient was found to have oxygen saturation in 70s, patient was briefly placed on nonrebreather, which was later transitioned to 3 L of oxygen, and currently weaned back to 2 L which is her baseline. ? ?Patient has underlying dementia and oriented to self only, which is her baseline. ? ?ED Course: Tachypneic, respiratory failure on 3 L oxygen currently, mild hypertension. ?BMP mild hyperglycemia glucose 156, creatinine 1.44 at baseline CKD stage III ?BNP 287, lactic acid 1.7 within normal range WBC count 8.9 within normal range ?UA positive, urine culture and blood culture collected. ?CXR No significant change in patchy pulmonary opacities at both lung bases, favored to reflect scarring based on relative stability. No new airspace disease ?Ct stone: 1. Consolidative densities at the right lung base which may represent infiltrate and/or atelectasis, correlate clinically.  ?2. Single small focus of air in the urinary  bladder which could be seen with infection, correlate with urinalysis. ?3. No nephrolithiasis or obstructive uropathy. ?4. Mild colonic diverticulosis. ?5. 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 Recommend annual imaging followup by CTA or MRA.  ? ?She was admitted for acute on chronic hypoxic respiratory failure, thought to be due to volume overload.  She received IV Lasix while in the hospital.  Respiratory viral panel was negative. ?There was a concern of pneumonia but there was no leukocytosis and she remained afebrile.  Not much upper respiratory symptoms. ? ?She was also found to have UTI with urine culture growing Enterococcus, history of recurrent UTIs in the past few months.  Unable to explain much urinary symptoms due to underlying dementia. ?Patient received ceftriaxone initially, later transitioned to cefepime and vancomycin for concern of lower extremity cellulitis and discharged on Augmentin to complete the course. ? ?She was also noted to have bilateral lower extremity erythema and mild tenderness.  There was concern of cellulitis, patient did receive cefepime and vancomycin while in the hospital and discharged on Augmentin.  She can follow-up with PCP for further recommendations. ? ?Her creatinine remained stable during diuresis. ? ?Patient did developed some delirium which is very common in this age group and with underlying dementia. ? ?3/21; patient appears more lethargic today.  Family still wants to take her back home and will provide the necessary care.  Initially decided to go home with palliative care, now thinking to go home with hospice services.  [facility] was notified and they will make arrangements for a potential discharge tomorrow. ? ?There was some overnight agitation, she was very  pleasant and oriented to herself only when seen during morning rounds.  Pointing to her husband and saying that her dad is here? ?Otherwise medically stable to discharge home  with hospice services. ? ?She is also being given antibiotics for a little longer duration for concern of frequent UTI.  She is also being discharged with Foley catheter which was replaced last night after failed voiding trial and urinary retention.  Unable to start Flomax due to sulfa allergies.  She needs to follow-up with urology as an outpatient for further recommendations. ? ?We also made her torsemide as needed as she appears dry and little worsening of creatinine. ?We restarted Seroquel at night for sundowning and overnight agitation. ?Amitriptyline was discontinued. ? ?She will continue with rest of her medications except mentioned above and follow-up with her providers in hospice services. ? ? ?Assessment and Plan: ?* Respiratory failure with hypoxia ([place]) ?Now at baseline.  It was thought to be secondary to volume overload.  BNP slightly elevated on admission.  There was some concern of right lower lobe pneumonia on renal CT but patient did not had any leukocytosis or fever.  Procalcitonin was not checked but she received antibiotics and being switched to Augmentin to complete the course that should cover her cellulitis, if there is a pneumonia and UTI. ?She also received IV Lasix while in the hospital. ?-Switch IV Lasix with p.o. torsemide as she was taking at home ?-Echocardiogram was within normal limits. ?-Continue with incentive spirometry ? ?UTI (urinary tract infection) ?Patient with history of recurrent UTI in the past few months.  Unable to explain any symptoms due to underlying dementia.  Urine cultures with Enterococcus. ?She received ceftriaxone followed by cefepime and vancomycin while in the hospital. ?-Switch antibiotics to Augmentin to complete the course-that will cover her UTI and a possible cellulitis. ? ?Cellulitis ?There was some concern of bilateral lower extremity cellulitis, nonpurulent.  Might be just venous congestion but she received cefepime and vancomycin while in the  hospital. ?Continue to have mild erythema with improvement in edema with diuresis. ?-Switch cefepime and vancomycin with Augmentin. ?-IV Lasix switched with p.o. torsemide ?-Echocardiogram done-pending results ? ?Hypothyroidism ?- Continue home dose of Synthroid ? ?Diabetes mellitus with stage 3 chronic kidney disease ([place]) ?CBG mildly elevated. ?-Continue with SSI ? ?CKD (chronic kidney disease), stage III ([place]) ?Creatinine appears to be at baseline. ?-Monitor renal function ?-Avoid nephrotoxins ? ?Hypokalemia ?Most likely secondary to diuretic use. ?Hypomagnesemia improved. ?-Replete potassium  and monitor ? ? ? ? ?  ? ?Pain control - [employer] Controlled Substance Reporting System database was reviewed. and patient was instructed, not to drive, operate heavy machinery, perform activities at heights, swimming or participation in water activities or provide baby-sitting services while on Pain, Sleep and Anxiety Medications; until their outpatient Physician has advised to do so again. Also recommended to not to take more than prescribed Pain, Sleep and Anxiety Medications.  ? ?Consultants: Palliative care ?Procedures performed: None ?Disposition: Hospice care ?Diet recommendation:  ?Discharge Diet Orders (From admission, onward)  ? ?  Start     Ordered  ? 01/24/22 0000  Diet - low sodium heart healthy       ? 01/24/22 1314  ? ?  ?  ? ?  ? ?Cardiac and Carb modified diet ?DISCHARGE MEDICATION: ?Allergies as of 01/24/2022   ? ?   Reactions  ? Elemental Sulfur   ? Latex Swelling  ? Morphine And Related   ? ?  ? ?  ?  Medication List  ?  ? ?STOP taking these medications   ? ?nortriptyline 25 MG capsule ?Commonly known as: PAMELOR ?  ? ?  ? ?TAKE these medications   ? ?acetaminophen 500 MG tablet ?Commonly known as: TYLENOL ?Take 500 mg by mouth every 6 (six) hours as needed. ?  ?amoxicillin-clavulanate 500-125 MG tablet ?Commonly known as: AUGMENTIN ?Take 1 tablet (500 mg total) by mouth every 12 (twelve) hours for 5  days. ?  ?aspirin EC 81 MG tablet ?Take 81 mg by mouth daily. ?  ?bisacodyl 10 MG suppository ?Commonly known as: DULCOLAX ?Place 1 suppository (10 mg total) rectally daily as needed for moderate constipation. ?  ?feed

## 2022-01-24 NOTE — Progress Notes (Signed)
PHARMACY NOTE:  ANTIMICROBIAL RENAL DOSAGE ADJUSTMENT ? ?Current antimicrobial regimen includes a mismatch between antimicrobial dosage and estimated renal function.  As per policy approved by the Pharmacy & Therapeutics and Medical Executive Committees, the antimicrobial dosage will be adjusted accordingly. ? ?Current antimicrobial dosage:  Augmentin 875-'125mg'$  (Amox-Clav) q12h ? ?Indication: UTI and possible Cellulitis ? ?Renal Function: ? ?Estimated Creatinine Clearance: 26.4 mL/min (A) (by C-G formula based on SCr of 1.41 mg/dL (H)). ?   ?Antimicrobial dosage has been changed to:  Augmentin 500-'125mg'$  (Amox-Clav) q12h ? ?Additional comments: ?- Scr: 1>1.05>1.15>1.41 ?- UOP 0.4>1.9>0.4 ml/k/h ? ?Thank you for allowing pharmacy to be a part of this patient's care. ? ?Lorna Dibble, RPH ?01/24/2022 7:41 AM ?

## 2022-01-24 NOTE — Progress Notes (Signed)
[facility]  ? ?DME is anticipated to arrive between 2-4p per family report. MSW notified family that transport will be arranged for 5:30p to allot time for DME arrival/assembly and family receptive. [facility] scheduled for 5:30p. ?  ?Please send signed and completed DNR with patient/family upon discharge. Please provide prescriptions at discharge as needed to ensure ongoing symptom management and a transport packet. ?  ?[facility] information and contact numbers given to family and above information shared with TOC.  ?  ?Please call with any questions/concerns.  ?  ?Thank you for the opportunity to participate in this patient's care ?  ?Daphene Calamity, MSW ?[facility] Liaison  ?[phone] ?  ? ?

## 2022-01-24 NOTE — TOC Progression Note (Signed)
Transition of Care (TOC) - Progression Note  ? ? ?Patient Details  ?Name: Crystal Delacruz ?MRN: [id number] ?Date of Birth: 1924-11-04 ? ?Transition of Care (TOC) CM/SW Contact  ?Pete Pelt, RN ?Phone Number: ?01/24/2022, 1:16 PM ? ?Clinical Narrative:   As per care team and [facility], patient is expected to discharge with Hospice to home today.  Shania at [facility] is facilitating discharge. ? ? ? ?  ?Barriers to Discharge: Continued Medical Work up ? ?Expected Discharge Plan and Services ?  ?  ?  ?  ?  ?Expected Discharge Date: 01/24/22               ?  ?  ?  ?  ?  ?  ?[place] Agency: [place] ([place]) ?Date HH Agency Contacted: 01/21/22 ?Time [place]hea: 2426 ?Representative spoke with at [place]: Floydene Flock ? ? ?Social Determinants of Health (SDOH) Interventions ?  ? ?Readmission Risk Interventions ?   ? View : No data to display.  ?  ?  ?  ? ? ?

## 2022-01-24 NOTE — Progress Notes (Signed)
Rounded on patient to find telephone cord and oxygen cord tangled tightly around patient left arm. Pt grew fearful and combative with attempt to untangle cord from her arm. Oxygen cord was cut to assist removal and telephone card unhooked from wall and receiver. Oxygen extension removed and oxygen placed back on patient. Telephone cord remains unattached for patient safety and patient is being allowed to hold on to receiver without cord.  With removal of cords from patients arm skin in appropriate coloration and tempeture.   No changes from shift assessment on left arm. NP, K. Foust notified. Continuing to monitor with increased Intentional rounding. Assessing potential need to trial telesitter. No new orders at this time.  ?

## 2022-01-24 NOTE — Progress Notes (Signed)
PHARMACIST - PHYSICIAN COMMUNICATION ? ?CONCERNING:  Enoxaparin (Lovenox) for DVT Prophylaxis  ? ? ?RECOMMENDATION: ?Patient was prescribed enoxaprin '40mg'$  q24 hours for VTE prophylaxis.  ? Danley Danker Weights  ? 01/19/22 1547  ?Weight: 82.8 kg (182 lb 8.7 oz)  ? ? ?Body mass index is 28.59 kg/m?. ? ?Estimated Creatinine Clearance: 26.4 mL/min (A) (by C-G formula based on SCr of 1.41 mg/dL (H)). ? ? ?Based on [place] patient is candidate for enoxaparin '30mg'$  every 24 hours based on CrCl <66m/min ? ?DESCRIPTION: ?Pharm[facility] ? ?Patient is now receiving enoxaparin 30 mg every 24hours  ? ?BLorna Dibble PharmD ?Clinical Pharmacist  ?01/24/2022 ?7:40 AM ? ?

## 2022-03-22 ENCOUNTER — Ambulatory Visit: Payer: PPO | Admitting: Dermatology

## 2022-03-22 DIAGNOSIS — D489 Neoplasm of uncertain behavior, unspecified: Secondary | ICD-10-CM

## 2022-03-22 DIAGNOSIS — L821 Other seborrheic keratosis: Secondary | ICD-10-CM

## 2022-03-22 DIAGNOSIS — C44729 Squamous cell carcinoma of skin of left lower limb, including hip: Secondary | ICD-10-CM

## 2022-03-22 DIAGNOSIS — I872 Venous insufficiency (chronic) (peripheral): Secondary | ICD-10-CM | POA: Diagnosis not present

## 2022-03-22 DIAGNOSIS — L578 Other skin changes due to chronic exposure to nonionizing radiation: Secondary | ICD-10-CM

## 2022-03-22 DIAGNOSIS — L820 Inflamed seborrheic keratosis: Secondary | ICD-10-CM | POA: Diagnosis not present

## 2022-03-22 DIAGNOSIS — L570 Actinic keratosis: Secondary | ICD-10-CM | POA: Diagnosis not present

## 2022-03-22 NOTE — Progress Notes (Signed)
Follow-Up Visit   Subjective  Crystal Delacruz is a 88 y.o. female who presents for the following: Actinic Keratosis (Patient here with family for ak follow up. Hx of aks at arms. ), inflamed seborrheic keratotis (Hx of isk at arms. ), stasis dermatitis (Hx of stasis dermatitis at b/l legs. Using mometasone cream as needed when flared. ), and Other (Patient reports spot at left lower leg, spots at arms, spot at back, and spot at right foot and right leg she would like checked. ). The patient has spots, moles and lesions to be evaluated, some may be new or changing and the patient has concerns that these could be cancer.  The following portions of the chart were reviewed this encounter and updated as appropriate:  Tobacco  Allergies  Meds  Problems  Med Hx  Surg Hx  Fam Hx     Review of Systems: No other skin or systemic complaints except as noted in HPI or Assessment and Plan.  Objective  Well appearing patient in no apparent distress; mood and affect are within normal limits.  A focused examination was performed including back, arms, face, neck, legs, hands. Relevant physical exam findings are noted in the Assessment and Plan.  left upper arm x 2, right upper arm x 1 (3) Erythematous thin papules/macules with gritty scale.   left cheek x 1, right forearm x 2, right upper arm x 1 (4) Erythematous stuck-on, waxy papule or plaque  left leg mid lateral pretibial 1.0 cm hyperkeratotic papule         Assessment & Plan  Actinic keratosis (3) left upper arm x 2, right upper arm x 1 Actinic keratoses are precancerous spots that appear secondary to cumulative UV radiation exposure/sun exposure over time. They are chronic with expected duration over 1 year. A portion of actinic keratoses will progress to squamous cell carcinoma of the skin. It is not possible to reliably predict which spots will progress to skin cancer and so treatment is recommended to prevent development of skin  cancer. Recommend daily broad spectrum sunscreen SPF 30+ to sun-exposed areas, reapply every 2 hours as needed.  Recommend staying in the shade or wearing long sleeves, sun glasses (UVA+UVB protection) and wide brim hats (4-inch brim around the entire circumference of the hat). Call for new or changing lesions. Destruction of lesion - left upper arm x 2, right upper arm x 1 Complexity: simple   Destruction method: cryotherapy   Informed consent: discussed and consent obtained   Timeout:  patient name, date of birth, surgical site, and procedure verified Lesion destroyed using liquid nitrogen: Yes   Region frozen until ice ball extended beyond lesion: Yes   Outcome: patient tolerated procedure well with no complications   Post-procedure details: wound care instructions given    Venous stasis dermatitis of left lower extremity b/l lower legs Stasis dermatitis with dermatitis  Stasis in the legs causes chronic leg swelling, which may result in itchy or painful rashes, skin discoloration, skin texture changes, and sometimes ulceration.  Recommend daily compression hose/stockings- easiest to put on first thing in morning, remove at bedtime.  Elevate legs as much as possible. Avoid salt/sodium rich foods.    Continue Mometasone cream apply to legs at bedtime, Mon-Fri   Inflamed seborrheic keratosis (4) left cheek x 1, right forearm x 2, right upper arm x 1 Destruction of lesion - left cheek x 1, right forearm x 2, right upper arm x 1 Complexity: simple   Destruction  method: cryotherapy   Informed consent: discussed and consent obtained   Timeout:  patient name, date of birth, surgical site, and procedure verified Lesion destroyed using liquid nitrogen: Yes   Region frozen until ice ball extended beyond lesion: Yes   Outcome: patient tolerated procedure well with no complications   Post-procedure details: wound care instructions given   Additional details:  Prior to procedure, discussed  risks of blister formation, small wound, skin dyspigmentation, or rare scar following cryotherapy. Recommend Vaseline ointment to treated areas while healing.  Neoplasm of uncertain behavior left leg mid lateral pretibial Epidermal / dermal shaving  Lesion diameter (cm):  1 Informed consent: discussed and consent obtained   Timeout: patient name, date of birth, surgical site, and procedure verified   Procedure prep:  Patient was prepped and draped in usual sterile fashion Prep type:  Isopropyl alcohol Anesthesia: the lesion was anesthetized in a standard fashion   Anesthetic:  1% lidocaine w/ epinephrine 1-100,000 buffered w/ 8.4% NaHCO3 Instrument used: flexible razor blade   Hemostasis achieved with: pressure, aluminum chloride and electrodesiccation   Outcome: patient tolerated procedure well   Post-procedure details: sterile dressing applied and wound care instructions given   Dressing type: bandage and petrolatum    Destruction of lesion Complexity: extensive   Destruction method: electrodesiccation and curettage   Informed consent: discussed and consent obtained   Timeout:  patient name, date of birth, surgical site, and procedure verified Procedure prep:  Patient was prepped and draped in usual sterile fashion Prep type:  Isopropyl alcohol Anesthesia: the lesion was anesthetized in a standard fashion   Anesthetic:  1% lidocaine w/ epinephrine 1-100,000 buffered w/ 8.4% NaHCO3 Curettage performed in three different directions: Yes   Electrodesiccation performed over the curetted area: Yes   Lesion length (cm):  1 Lesion width (cm):  1 Margin per side (cm):  0.2 Final wound size (cm):  1.4 Hemostasis achieved with:  pressure, aluminum chloride and electrodesiccation Outcome: patient tolerated procedure well with no complications   Post-procedure details: sterile dressing applied and wound care instructions given   Dressing type: bandage and petrolatum    Specimen 1 -  Surgical pathology Differential Diagnosis: r/o scc  Check Margins: No R/o scc   Seborrheic Keratoses - Stuck-on, waxy, tan-brown papules and/or plaques  at right foot and right leg - Benign-appearing - Discussed benign etiology and prognosis. - Observe - Call for any changes  Actinic Damage - chronic, secondary to cumulative UV radiation exposure/sun exposure over time - diffuse scaly erythematous macules with underlying dyspigmentation - Recommend daily broad spectrum sunscreen SPF 30+ to sun-exposed areas, reapply every 2 hours as needed.  - Recommend staying in the shade or wearing long sleeves, sun glasses (UVA+UVB protection) and wide brim hats (4-inch brim around the entire circumference of the hat). - Call for new or changing lesions.  Return in about 6 months (around 09/22/2022) for ak followup. IRuthell Rummage, CMA, am acting as scribe for Sarina Ser, MD. Documentation: I have reviewed the above documentation for accuracy and completeness, and I agree with the above.  Sarina Ser, MD

## 2022-03-22 NOTE — Patient Instructions (Addendum)
Electrodesiccation and Curettage (?Scrape and Burn?) Wound Care Instructions ? ?Leave the original bandage on for 24 hours if possible.  If the bandage becomes soaked or soiled before that time, it is OK to remove it and examine the wound.  A small amount of post-operative bleeding is normal.  If excessive bleeding occurs, remove the bandage, place gauze over the site and apply continuous pressure (no peeking) over the area for 30 minutes. If this does not work, please call our clinic as soon as possible or page your doctor if it is after hours.  ? ?Once a day, cleanse the wound with soap and water. It is fine to shower. If a thick crust develops you may use a Q-tip dipped into dilute hydrogen peroxide (mix 1:1 with water) to dissolve it.  Hydrogen peroxide can slow the healing process, so use it only as needed.   ? ?After washing, apply petroleum jelly (Vaseline) or an antibiotic ointment if your doctor prescribed one for you, followed by a bandage.   ? ?For best healing, the wound should be covered with a layer of ointment at all times. If you are not able to keep the area covered with a bandage to hold the ointment in place, this may mean re-applying the ointment several times a day.  Continue this wound care until the wound has healed and is no longer open. It may take several weeks for the wound to heal and close. ? ?Itching and mild discomfort is normal during the healing process. ? ?If you have any discomfort, you can take Tylenol (acetaminophen) or ibuprofen as directed on the bottle. (Please do not take these if you have an allergy to them or cannot take them for another reason). ? ?Some redness, tenderness and white or yellow material in the wound is normal healing.  If the area becomes very sore and red, or develops a thick yellow-green material (pus), it may be infected; please notify us.   ? ?Wound healing continues for up to one year following surgery. It is not unusual to experience pain in the scar  from time to time during the interval.  If the pain becomes severe or the scar thickens, you should notify the office.   ? ?A slight amount of redness in a scar is expected for the first six months.  After six months, the redness will fade and the scar will soften and fade.  The color difference becomes less noticeable with time.  If there are any problems, return for a post-op surgery check at your earliest convenience. ? ?To improve the appearance of the scar, you can use silicone scar gel, cream, or sheets (such as Mederma or Serica) every night for up to one year. These are available over the counter (without a prescription). ? ?Please call our office at [phone] for any questions or concerns. ? ? ? ?Biopsy Wound Care Instructions ? ?Leave the original bandage on for 24 hours if possible.  If the bandage becomes soaked or soiled before that time, it is OK to remove it and examine the wound.  A small amount of post-operative bleeding is normal.  If excessive bleeding occurs, remove the bandage, place gauze over the site and apply continuous pressure (no peeking) over the area for 30 minutes. If this does not work, please call our clinic as soon as possible or page your doctor if it is after hours.  ? ?Once a day, cleanse the wound with soap and water. It is fine to  shower. If a thick crust develops you may use a Q-tip dipped into dilute hydrogen peroxide (mix 1:1 with water) to dissolve it.  Hydrogen peroxide can slow the healing process, so use it only as needed.   ? ?After washing, apply petroleum jelly (Vaseline) or an antibiotic ointment if your doctor prescribed one for you, followed by a bandage.   ? ?For best healing, the wound should be covered with a layer of ointment at all times. If you are not able to keep the area covered with a bandage to hold the ointment in place, this may mean re-applying the ointment several times a day.  Continue this wound care until the wound has healed and is no longer  open.  ? ?Itching and mild discomfort is normal during the healing process. However, if you develop pain or severe itching, please call our office.  ? ?If you have any discomfort, you can take Tylenol (acetaminophen) or ibuprofen as directed on the bottle. (Please do not take these if you have an allergy to them or cannot take them for another reason). ? ?Some redness, tenderness and white or yellow material in the wound is normal healing.  If the area becomes very sore and red, or develops a thick yellow-green material (pus), it may be infected; please notify us.   ? ?If you have stitches, return to clinic as directed to have the stitches removed. You will continue wound care for 2-3 days after the stitches are removed.  ? ?Wound healing continues for up to one year following surgery. It is not unusual to experience pain in the scar from time to time during the interval.  If the pain becomes severe or the scar thickens, you should notify the office.   ? ?A slight amount of redness in a scar is expected for the first six months.  After six months, the redness will fade and the scar will soften and fade.  The color difference becomes less noticeable with time.  If there are any problems, return for a post-op surgery check at your earliest convenience. ? ?To improve the appearance of the scar, you can use silicone scar gel, cream, or sheets (such as Mederma or Serica) every night for up to one year. These are available over the counter (without a prescription). ? ?Please call our office at [phone] for any questions or concerns. ? ? ? ? ?Actinic keratoses are precancerous spots that appear secondary to cumulative UV radiation exposure/sun exposure over time. They are chronic with expected duration over 1 year. A portion of actinic keratoses will progress to squamous cell carcinoma of the skin. It is not possible to reliably predict which spots will progress to skin cancer and so treatment is recommended to  prevent development of skin cancer. ? ?Recommend daily broad spectrum sunscreen SPF 30+ to sun-exposed areas, reapply every 2 hours as needed.  ?Recommend staying in the shade or wearing long sleeves, sun glasses (UVA+UVB protection) and wide brim hats (4-inch brim around the entire circumference of the hat). ?Call for new or changing lesions.  ? ?Cryotherapy Aftercare ? ?Wash gently with soap and water everyday.   ?Apply Vaseline and Band-Aid daily until healed.  ? ? ?Seborrheic Keratosis ? ?What causes seborrheic keratoses? ?Seborrheic keratoses are harmless, common skin growths that first appear during adult life.  As time goes by, more growths appear.  Some people may develop a large number of them.  Seborrheic keratoses appear on both covered and uncovered body parts.  They are not caused by sunlight.  The tendency to develop seborrheic keratoses can be inherited.  They vary in color from skin-colored to gray, brown, or even black.  They can be either smooth or have a rough, warty surface.   ?Seborrheic keratoses are superficial and look as if they were stuck on the skin.  Under the microscope this type of keratosis looks like layers upon layers of skin.  That is why at times the top layer may seem to fall off, but the rest of the growth remains and re-grows.   ? ?Treatment ?Seborrheic keratoses do not need to be treated, but can easily be removed in the office.  Seborrheic keratoses often cause symptoms when they rub on clothing or jewelry.  Lesions can be in the way of shaving.  If they become inflamed, they can cause itching, soreness, or burning.  Removal of a seborrheic keratosis can be accomplished by freezing, burning, or surgery. ?If any spot bleeds, scabs, or grows rapidly, please return to have it checked, as these can be an indication of a skin cancer. ? ?If You Need Anything After Your Visit ? ?If you have any questions or concerns for your doctor, please call our main line at [phone] and press  option 4 to reach your doctor's medical assistant. If no one answers, please leave a voicemail as directed and we will return your call as soon as possible. Messages left after 4 pm will be answered the fol

## 2022-03-27 ENCOUNTER — Telehealth: Payer: Self-pay

## 2022-03-27 NOTE — Telephone Encounter (Signed)
-----   Message from Ralene Bathe, MD sent at 03/23/2022  6:25 PM EDT ----- Diagnosis Skin (M), left leg mid lateral pretibial MODERATELY DIFFERENTIATED SQUAMOUS CELL CARCINOMA, BASE INVOLVED  Cancer - Scc Already treated Recheck next visit

## 2022-03-27 NOTE — Telephone Encounter (Signed)
Spouse advised of BX results. aw

## 2022-03-27 NOTE — Telephone Encounter (Signed)
Spoke to pts daughter in law and advised to have pt's husband call for bx results./sh

## 2022-04-01 ENCOUNTER — Encounter: Payer: Self-pay | Admitting: Dermatology

## 2022-04-17 DIAGNOSIS — E039 Hypothyroidism, unspecified: Secondary | ICD-10-CM | POA: Diagnosis not present

## 2022-04-17 DIAGNOSIS — I700 Atherosclerosis of aorta: Secondary | ICD-10-CM | POA: Diagnosis not present

## 2022-04-17 DIAGNOSIS — I10 Essential (primary) hypertension: Secondary | ICD-10-CM | POA: Diagnosis not present

## 2022-04-17 DIAGNOSIS — N183 Chronic kidney disease, stage 3 unspecified: Secondary | ICD-10-CM | POA: Diagnosis not present

## 2022-04-17 DIAGNOSIS — E1122 Type 2 diabetes mellitus with diabetic chronic kidney disease: Secondary | ICD-10-CM | POA: Diagnosis not present

## 2022-04-24 DIAGNOSIS — E039 Hypothyroidism, unspecified: Secondary | ICD-10-CM | POA: Diagnosis not present

## 2022-04-24 DIAGNOSIS — E1122 Type 2 diabetes mellitus with diabetic chronic kidney disease: Secondary | ICD-10-CM | POA: Diagnosis not present

## 2022-04-24 DIAGNOSIS — N3000 Acute cystitis without hematuria: Secondary | ICD-10-CM | POA: Diagnosis not present

## 2022-04-24 DIAGNOSIS — M791 Myalgia, unspecified site: Secondary | ICD-10-CM | POA: Diagnosis not present

## 2022-04-24 DIAGNOSIS — T466X5A Adverse effect of antihyperlipidemic and antiarteriosclerotic drugs, initial encounter: Secondary | ICD-10-CM | POA: Diagnosis not present

## 2022-04-24 DIAGNOSIS — N183 Chronic kidney disease, stage 3 unspecified: Secondary | ICD-10-CM | POA: Diagnosis not present

## 2022-04-24 DIAGNOSIS — I10 Essential (primary) hypertension: Secondary | ICD-10-CM | POA: Diagnosis not present

## 2022-04-24 DIAGNOSIS — F03918 Unspecified dementia, unspecified severity, with other behavioral disturbance: Secondary | ICD-10-CM | POA: Diagnosis not present

## 2022-04-24 DIAGNOSIS — I700 Atherosclerosis of aorta: Secondary | ICD-10-CM | POA: Diagnosis not present

## 2022-04-24 DIAGNOSIS — E782 Mixed hyperlipidemia: Secondary | ICD-10-CM | POA: Diagnosis not present

## 2022-04-25 DIAGNOSIS — F0390 Unspecified dementia without behavioral disturbance: Secondary | ICD-10-CM | POA: Diagnosis not present

## 2022-04-25 DIAGNOSIS — T466X5A Adverse effect of antihyperlipidemic and antiarteriosclerotic drugs, initial encounter: Secondary | ICD-10-CM | POA: Diagnosis not present

## 2022-04-25 DIAGNOSIS — M791 Myalgia, unspecified site: Secondary | ICD-10-CM | POA: Diagnosis not present

## 2022-04-25 DIAGNOSIS — N183 Chronic kidney disease, stage 3 unspecified: Secondary | ICD-10-CM | POA: Diagnosis not present

## 2022-04-25 DIAGNOSIS — L0390 Cellulitis, unspecified: Secondary | ICD-10-CM | POA: Diagnosis not present

## 2022-04-25 DIAGNOSIS — E785 Hyperlipidemia, unspecified: Secondary | ICD-10-CM | POA: Diagnosis not present

## 2022-04-25 DIAGNOSIS — N3000 Acute cystitis without hematuria: Secondary | ICD-10-CM | POA: Diagnosis not present

## 2022-04-25 DIAGNOSIS — E039 Hypothyroidism, unspecified: Secondary | ICD-10-CM | POA: Diagnosis not present

## 2022-04-25 DIAGNOSIS — N1832 Chronic kidney disease, stage 3b: Secondary | ICD-10-CM | POA: Diagnosis not present

## 2022-04-25 DIAGNOSIS — E782 Mixed hyperlipidemia: Secondary | ICD-10-CM | POA: Diagnosis not present

## 2022-04-25 DIAGNOSIS — E1122 Type 2 diabetes mellitus with diabetic chronic kidney disease: Secondary | ICD-10-CM | POA: Diagnosis not present

## 2022-04-25 DIAGNOSIS — I129 Hypertensive chronic kidney disease with stage 1 through stage 4 chronic kidney disease, or unspecified chronic kidney disease: Secondary | ICD-10-CM | POA: Diagnosis not present

## 2022-04-25 DIAGNOSIS — I700 Atherosclerosis of aorta: Secondary | ICD-10-CM | POA: Diagnosis not present

## 2022-04-25 DIAGNOSIS — I10 Essential (primary) hypertension: Secondary | ICD-10-CM | POA: Diagnosis not present

## 2022-04-25 DIAGNOSIS — E1151 Type 2 diabetes mellitus with diabetic peripheral angiopathy without gangrene: Secondary | ICD-10-CM | POA: Diagnosis not present

## 2022-04-25 DIAGNOSIS — F03918 Unspecified dementia, unspecified severity, with other behavioral disturbance: Secondary | ICD-10-CM | POA: Diagnosis not present

## 2022-05-14 DIAGNOSIS — F0390 Unspecified dementia without behavioral disturbance: Secondary | ICD-10-CM | POA: Diagnosis not present

## 2022-07-13 ENCOUNTER — Ambulatory Visit (INDEPENDENT_AMBULATORY_CARE_PROVIDER_SITE_OTHER): Payer: PPO | Admitting: Nurse Practitioner

## 2022-07-13 ENCOUNTER — Encounter (INDEPENDENT_AMBULATORY_CARE_PROVIDER_SITE_OTHER): Payer: PPO

## 2022-07-31 DIAGNOSIS — E1151 Type 2 diabetes mellitus with diabetic peripheral angiopathy without gangrene: Secondary | ICD-10-CM | POA: Diagnosis not present

## 2022-07-31 DIAGNOSIS — I872 Venous insufficiency (chronic) (peripheral): Secondary | ICD-10-CM | POA: Diagnosis not present

## 2022-07-31 DIAGNOSIS — E785 Hyperlipidemia, unspecified: Secondary | ICD-10-CM | POA: Diagnosis not present

## 2022-07-31 DIAGNOSIS — N1832 Chronic kidney disease, stage 3b: Secondary | ICD-10-CM | POA: Diagnosis not present

## 2022-07-31 DIAGNOSIS — E1122 Type 2 diabetes mellitus with diabetic chronic kidney disease: Secondary | ICD-10-CM | POA: Diagnosis not present

## 2022-07-31 DIAGNOSIS — I129 Hypertensive chronic kidney disease with stage 1 through stage 4 chronic kidney disease, or unspecified chronic kidney disease: Secondary | ICD-10-CM | POA: Diagnosis not present

## 2022-07-31 DIAGNOSIS — L0390 Cellulitis, unspecified: Secondary | ICD-10-CM | POA: Diagnosis not present

## 2022-07-31 DIAGNOSIS — F0390 Unspecified dementia without behavioral disturbance: Secondary | ICD-10-CM | POA: Diagnosis not present

## 2022-09-24 ENCOUNTER — Ambulatory Visit: Payer: PPO | Admitting: Dermatology

## 2022-09-24 DIAGNOSIS — E1151 Type 2 diabetes mellitus with diabetic peripheral angiopathy without gangrene: Secondary | ICD-10-CM | POA: Diagnosis not present

## 2022-09-24 DIAGNOSIS — E1122 Type 2 diabetes mellitus with diabetic chronic kidney disease: Secondary | ICD-10-CM | POA: Diagnosis not present

## 2022-09-24 DIAGNOSIS — L0390 Cellulitis, unspecified: Secondary | ICD-10-CM | POA: Diagnosis not present

## 2022-09-24 DIAGNOSIS — E785 Hyperlipidemia, unspecified: Secondary | ICD-10-CM | POA: Diagnosis not present

## 2022-09-24 DIAGNOSIS — N1832 Chronic kidney disease, stage 3b: Secondary | ICD-10-CM | POA: Diagnosis not present

## 2022-09-24 DIAGNOSIS — I129 Hypertensive chronic kidney disease with stage 1 through stage 4 chronic kidney disease, or unspecified chronic kidney disease: Secondary | ICD-10-CM | POA: Diagnosis not present

## 2022-09-24 DIAGNOSIS — F0390 Unspecified dementia without behavioral disturbance: Secondary | ICD-10-CM | POA: Diagnosis not present

## 2022-11-26 DIAGNOSIS — L0390 Cellulitis, unspecified: Secondary | ICD-10-CM | POA: Diagnosis not present

## 2022-11-26 DIAGNOSIS — I129 Hypertensive chronic kidney disease with stage 1 through stage 4 chronic kidney disease, or unspecified chronic kidney disease: Secondary | ICD-10-CM | POA: Diagnosis not present

## 2022-11-26 DIAGNOSIS — E1122 Type 2 diabetes mellitus with diabetic chronic kidney disease: Secondary | ICD-10-CM | POA: Diagnosis not present

## 2022-11-26 DIAGNOSIS — E785 Hyperlipidemia, unspecified: Secondary | ICD-10-CM | POA: Diagnosis not present

## 2022-11-26 DIAGNOSIS — E1151 Type 2 diabetes mellitus with diabetic peripheral angiopathy without gangrene: Secondary | ICD-10-CM | POA: Diagnosis not present

## 2022-11-26 DIAGNOSIS — N1832 Chronic kidney disease, stage 3b: Secondary | ICD-10-CM | POA: Diagnosis not present

## 2022-11-26 DIAGNOSIS — F03911 Unspecified dementia, unspecified severity, with agitation: Secondary | ICD-10-CM | POA: Diagnosis not present

## 2023-01-07 ENCOUNTER — Encounter: Payer: Self-pay | Admitting: Dermatology

## 2023-01-07 ENCOUNTER — Ambulatory Visit: Payer: PPO | Admitting: Dermatology

## 2023-01-07 VITALS — BP 117/78 | HR 106

## 2023-01-07 DIAGNOSIS — L578 Other skin changes due to chronic exposure to nonionizing radiation: Secondary | ICD-10-CM | POA: Diagnosis not present

## 2023-01-07 DIAGNOSIS — Z85828 Personal history of other malignant neoplasm of skin: Secondary | ICD-10-CM | POA: Diagnosis not present

## 2023-01-07 DIAGNOSIS — L820 Inflamed seborrheic keratosis: Secondary | ICD-10-CM

## 2023-01-07 DIAGNOSIS — Z8589 Personal history of malignant neoplasm of other organs and systems: Secondary | ICD-10-CM

## 2023-01-07 NOTE — Progress Notes (Signed)
   Follow-Up Visit   Subjective  Crystal Delacruz is a 89 y.o. female who presents for the following: Skin Problem (Patient here with family today concerning some spots at back, left elbow, left lower leg, right thigh, and right lower leg. ). The patient has spots, moles and lesions to be evaluated, some may be new or changing and the patient has concerns that these could be cancer.  The following portions of the chart were reviewed this encounter and updated as appropriate:  Tobacco  Allergies  Meds  Problems  Med Hx  Surg Hx  Fam Hx     Review of Systems: No other skin or systemic complaints except as noted in HPI or Assessment and Plan.  Objective  Well appearing patient in no apparent distress; mood and affect are within normal limits.  A focused examination was performed including b/l legs, right thigh, back . Relevant physical exam findings are noted in the Assessment and Plan.  left mid pretibial x 1, right distal pretibial x 1, right thigh x 1, right upper back x 4, left elbow x 1, left popliteal area  x 1 (9) Erythematous stuck-on, waxy papule or plaque   Assessment & Plan  Inflamed seborrheic keratosis (9) left mid pretibial x 1, right distal pretibial x 1, right thigh x 1, right upper back x 4, left elbow x 1, left popliteal area  x 1  Symptomatic, irritating, patient would like treated.  Destruction of lesion - left mid pretibial x 1, right distal pretibial x 1, right thigh x 1, right upper back x 4, left elbow x 1, left popliteal area  x 1 Complexity: simple   Destruction method: cryotherapy   Informed consent: discussed and consent obtained   Timeout:  patient name, date of birth, surgical site, and procedure verified Lesion destroyed using liquid nitrogen: Yes   Region frozen until ice ball extended beyond lesion: Yes   Outcome: patient tolerated procedure well with no complications   Post-procedure details: wound care instructions given    Actinic Damage -  chronic, secondary to cumulative UV radiation exposure/sun exposure over time - diffuse scaly erythematous macules with underlying dyspigmentation - Recommend daily broad spectrum sunscreen SPF 30+ to sun-exposed areas, reapply every 2 hours as needed.  - Recommend staying in the shade or wearing long sleeves, sun glasses (UVA+UVB protection) and wide brim hats (4-inch brim around the entire circumference of the hat). - Call for new or changing lesions.  History of Squamous Cell Carcinoma of the Skin Left mid pretibial 2023 EDC  - No evidence of recurrence today - No lymphadenopathy - Recommend regular full body skin exams - Recommend daily broad spectrum sunscreen SPF 30+ to sun-exposed areas, reapply every 2 hours as needed.  - Call if any new or changing lesions are noted between office visits  Return if symptoms worsen or fail to improve.  IRuthell Rummage, CMA, am acting as scribe for Sarina Ser, MD. Documentation: I have reviewed the above documentation for accuracy and completeness, and I agree with the above.  Sarina Ser, MD

## 2023-01-07 NOTE — Patient Instructions (Addendum)

## 2023-01-12 ENCOUNTER — Encounter: Payer: Self-pay | Admitting: Dermatology

## 2023-01-21 DIAGNOSIS — N1832 Chronic kidney disease, stage 3b: Secondary | ICD-10-CM | POA: Diagnosis not present

## 2023-01-21 DIAGNOSIS — E785 Hyperlipidemia, unspecified: Secondary | ICD-10-CM | POA: Diagnosis not present

## 2023-01-21 DIAGNOSIS — E1151 Type 2 diabetes mellitus with diabetic peripheral angiopathy without gangrene: Secondary | ICD-10-CM | POA: Diagnosis not present

## 2023-01-21 DIAGNOSIS — E1122 Type 2 diabetes mellitus with diabetic chronic kidney disease: Secondary | ICD-10-CM | POA: Diagnosis not present

## 2023-01-21 DIAGNOSIS — F03911 Unspecified dementia, unspecified severity, with agitation: Secondary | ICD-10-CM | POA: Diagnosis not present

## 2023-01-21 DIAGNOSIS — L0390 Cellulitis, unspecified: Secondary | ICD-10-CM | POA: Diagnosis not present

## 2023-01-21 DIAGNOSIS — I129 Hypertensive chronic kidney disease with stage 1 through stage 4 chronic kidney disease, or unspecified chronic kidney disease: Secondary | ICD-10-CM | POA: Diagnosis not present

## 2023-03-18 ENCOUNTER — Encounter: Payer: Self-pay | Admitting: Cardiovascular Disease

## 2023-03-18 ENCOUNTER — Ambulatory Visit: Payer: PPO | Attending: Cardiovascular Disease | Admitting: Cardiovascular Disease

## 2023-03-18 VITALS — BP 110/60 | HR 72 | Ht 67.0 in | Wt 175.0 lb

## 2023-03-18 DIAGNOSIS — I77810 Thoracic aortic ectasia: Secondary | ICD-10-CM | POA: Diagnosis not present

## 2023-03-18 DIAGNOSIS — E782 Mixed hyperlipidemia: Secondary | ICD-10-CM

## 2023-03-18 DIAGNOSIS — T466X5A Adverse effect of antihyperlipidemic and antiarteriosclerotic drugs, initial encounter: Secondary | ICD-10-CM

## 2023-03-18 DIAGNOSIS — E118 Type 2 diabetes mellitus with unspecified complications: Secondary | ICD-10-CM

## 2023-03-18 DIAGNOSIS — I700 Atherosclerosis of aorta: Secondary | ICD-10-CM

## 2023-03-18 DIAGNOSIS — I2510 Atherosclerotic heart disease of native coronary artery without angina pectoris: Secondary | ICD-10-CM | POA: Diagnosis not present

## 2023-03-18 DIAGNOSIS — I2584 Coronary atherosclerosis due to calcified coronary lesion: Secondary | ICD-10-CM | POA: Diagnosis not present

## 2023-03-18 DIAGNOSIS — I10 Essential (primary) hypertension: Secondary | ICD-10-CM

## 2023-03-18 DIAGNOSIS — M791 Myalgia, unspecified site: Secondary | ICD-10-CM

## 2023-03-18 NOTE — Progress Notes (Signed)
Cardiology Office Note  Date:  03/18/2023   ID:  Crystal Delacruz, DOB 1924/02/18, MRN [id number]  PCP:  Lauro Regulus, MD   Chief Complaint  Patient presents with   New Patient (Initial Visit)    Establish care for Coronary atherosclerosis of native coronary artery. Patient has bilateral LE edema with hospice care for dementia. Medications reviewed by the patient verbally.     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, unchanged since 2019 Who presents to establish care in the [facility] office for her cardiac  disease/hypertension, three-vessel coronary calcification, mild diffuse aortic atherosclerosis  Presents today in a wheelchair with husband and other family  Husband reports that she is under care of hospice, for the past year  less appetite, weight seems relatively stable No walking apart from very short distances in the house With assistance able to stand and pivot or walk a few steps Sleeps much of the day, 18 hrs a day  Chronic mild lower extremity edema Takes torsemide 60 mg daily No longer on metoprolol  Lab work reviewed A1c 6.9 Total cholesterol 175 LDL 95  echocardiogram March 2023  revealed normal LV function with no significant valvular structural abnormalities  Mildly elevated right heart pressures  CT scan chest December 05, 2021 Images pulled up and reviewed,  mild diffuse aortic atherosclerosis, mild three-vessel coronary calcification  EKG personally reviewed by myself on todays visit Normal sinus rhythm rate 72 bpm no significant ST-T wave  changes   PMH:   has a past medical history of Arthritis, Asthma, Diabetes mellitus without complication (HCC), Hypertension, Hypothyroidism, Squamous cell carcinoma of skin (09/29/2018), Squamous cell carcinoma of skin (06/21/2021), Squamous cell carcinoma of skin (12/19/2020), Squamous cell carcinoma of skin (06/21/2021), Squamous cell carcinoma of skin (06/21/2021), and Squamous cell carcinoma of skin (03/22/2022).  PSH:    Past Surgical History:  Procedure Laterality Date   none      Current Outpatient Medications  Medication Sig Dispense Refill   acetaminophen (TYLENOL) 500 MG tablet Take 500 mg by mouth every 6 (six) hours as needed.     albuterol (VENTOLIN HFA) 108 (90 Base) MCG/ACT inhaler INHALE 2 INHALATIONS BY MOUTH INTO THE LUNGS EVERY 4 HOURS AS NEEDED FOR WHEEZING OR SHORTNESS OF BREATH     aspirin EC 81 MG tablet Take 81 mg by mouth daily.     bisacodyl (DULCOLAX) 10 MG suppository Place 1 suppository (10 mg total) rectally daily as needed for moderate constipation. 12 suppository 0   gabapentin (NEURONTIN) 100 MG capsule Take 200 mg by mouth 3 (three) times daily.     glimepiride (AMARYL) 4 MG tablet Take 4 mg by mouth in the morning and at bedtime.     levothyroxine (SYNTHROID, LEVOTHROID) 50 MCG tablet Take 50 mcg by mouth daily.     QUEtiapine (SEROQUEL) 25 MG tablet Take 25 mg by mouth 3 (three) times daily.     torsemide (DEMADEX) 20 MG tablet Take 60 mg by mouth daily.     feeding supplement, GLUCERNA SHAKE, (GLUCERNA SHAKE) LIQD Take 237 mLs by mouth 3 (three) times daily between meals. (Patient not taking:  Reported on 03/18/2023) 1000 mL 12   LANTUS SOLOSTAR 100 UNIT/ML Solostar Pen Inject 15 Units into the skin daily. (Patient not taking: Reported on 03/18/2023)     ondansetron (ZOFRAN) 4 MG tablet Take 1 tablet (4 mg total) by mouth every 6 (six) hours as needed for nausea. (Patient not taking: Reported on 03/18/2023) 20 tablet 0   oxyCODONE (OXY IR/ROXICODONE) 5 MG  immediate release tablet Take 1 tablet (5 mg total) by mouth every 4 (four) hours as needed for moderate pain. (Patient not taking: Reported on 03/18/2023) 30 tablet 0   QUEtiapine (SEROQUEL) 25 MG tablet Take 1 tablet (25 mg total) by mouth at bedtime. (Patient not taking: Reported on 03/18/2023) 30 tablet 1   No current facility-administered medications for this visit.     Allergies:   Elemental sulfur, Latex, and Morphine and related   Social History:  The patient  reports that she has never smoked. She has never used smokeless tobacco. She reports that she does not drink alcohol and does not use drugs.   Family History:   family history is not on file.    Review of Systems: Review of Systems  Constitutional: Negative.   HENT: Negative.    Respiratory: Negative.    Cardiovascular: Negative.   Gastrointestinal: Negative.   Musculoskeletal: Negative.   Neurological: Negative.   Psychiatric/Behavioral: Negative.    All other systems reviewed and are negative.    PHYSICAL EXAM: VS:  BP 110/60 (BP Location: Right Arm, Patient Position: Sitting, Cuff Size: Normal)   Pulse 72   Ht 5\' 7"  (1.702 m)   Wt 175 lb (79.4 kg)   SpO2 91%   BMI 27.41 kg/m  , BMI Body mass index is 27.41 kg/m. GEN: Well nourished, well developed, in no acute distress, in a wheelchair HEENT: normal Neck: no JVD, carotid bruits, or masses Cardiac: RRR; no murmurs, rubs, or gallops,no edema  Respiratory:  clear to auscultation bilaterally, normal work of breathing GI: soft, nontender, nondistended, + BS MS: no deformity or atrophy Skin: warm and dry, no rash Neuro:  Strength and sensation are intact Psych: euthymic mood, full affect  Recent Labs: No results found for requested labs within last 365 days.    Lipid Panel No results found for: "CHOL", "HDL", "LDLCALC", "TRIG"    Wt Readings from Last 3 Encounters:  03/18/23 175 lb (79.4 kg)  01/19/22 182 lb 8.7 oz (82.8 kg)  10/18/18 178 lb (80.7  kg)       ASSESSMENT AND PLAN:  Problem List Items Addressed This Visit       Cardiology Problems   Hyperlipidemia   Relevant Medications   torsemide (DEMADEX) 20 MG tablet   Other Visit Diagnoses     Coronary artery calcification    -  Primary   Relevant Medications   torsemide (DEMADEX) 20 MG tablet   Aortic atherosclerosis (HCC)       Relevant Medications   torsemide (DEMADEX) 20 MG tablet   Myalgia due to statin       Diabetes mellitus type 2 with complications (HCC)       Essential hypertension       Relevant Medications   torsemide (DEMADEX) 20 MG tablet   Mild dilation of ascending aorta (HCC)       Relevant Medications   torsemide (DEMADEX) 20 MG tablet      Coronary artery calcification/aortic atherosclerosis Asymptomatic, no further workup needed at this time Currently not on a statin, notes indicating  statin myalgia  Mild dilation of the aorta Previously noted on CT scan, stable 4.2 cm No further workup needed given age, comorbidities  Diabetes type 2 with complications A1c 6.9, currently not on diabetes medications apart from glimepiride  Essential hypertension Blood pressure is well controlled on today's visit. No changes made to the medications.     Total encounter time more than 60 minutes  Greater than 50% was spent in counseling and coordination of care with the patient    Signed, Dossie Arbour, M.D., Ph.D. [facility] [place], [place] [phone]

## 2023-03-18 NOTE — Patient Instructions (Signed)
Medication Instructions:  No changes  If you need a refill on your cardiac medications before your next appointment, please call your pharmacy.    Lab work: No new labs needed   Testing/Procedures: No new testing needed   Follow-Up: At [facility], you and your health needs are our priority.  As part of our continuing mission to provide you with exceptional heart care, we have created designated Provider Care Teams.  These Care Teams include your primary Cardiologist (physician) and Advanced Practice Providers (APPs -  Physician Assistants and Nurse Practitioners) who all work together to provide you with the care you need, when you need it.  You will need a follow up appointment in 6 months  Providers on your designated Care Team:   Christopher Berge, NP Ryan Dunn, PA-C Cadence Furth, PA-C  COVID-19 Vaccine Information can be found at: [email or web address] For questions related to vaccine distribution or appointments, please email [email or web address] or call [phone].   

## 2023-03-21 IMAGING — CR DG CHEST 2V
1 series · 2 of 2 positions shown · non-contrast
Comparison: 10/21/2018

CLINICAL DATA: Shortness of breath

EXAM:
CHEST - 2 VIEW

[Series 1: dg chest 2 view · 0.14mm/px · 2 of 2 slices shown]
[im 1/2]
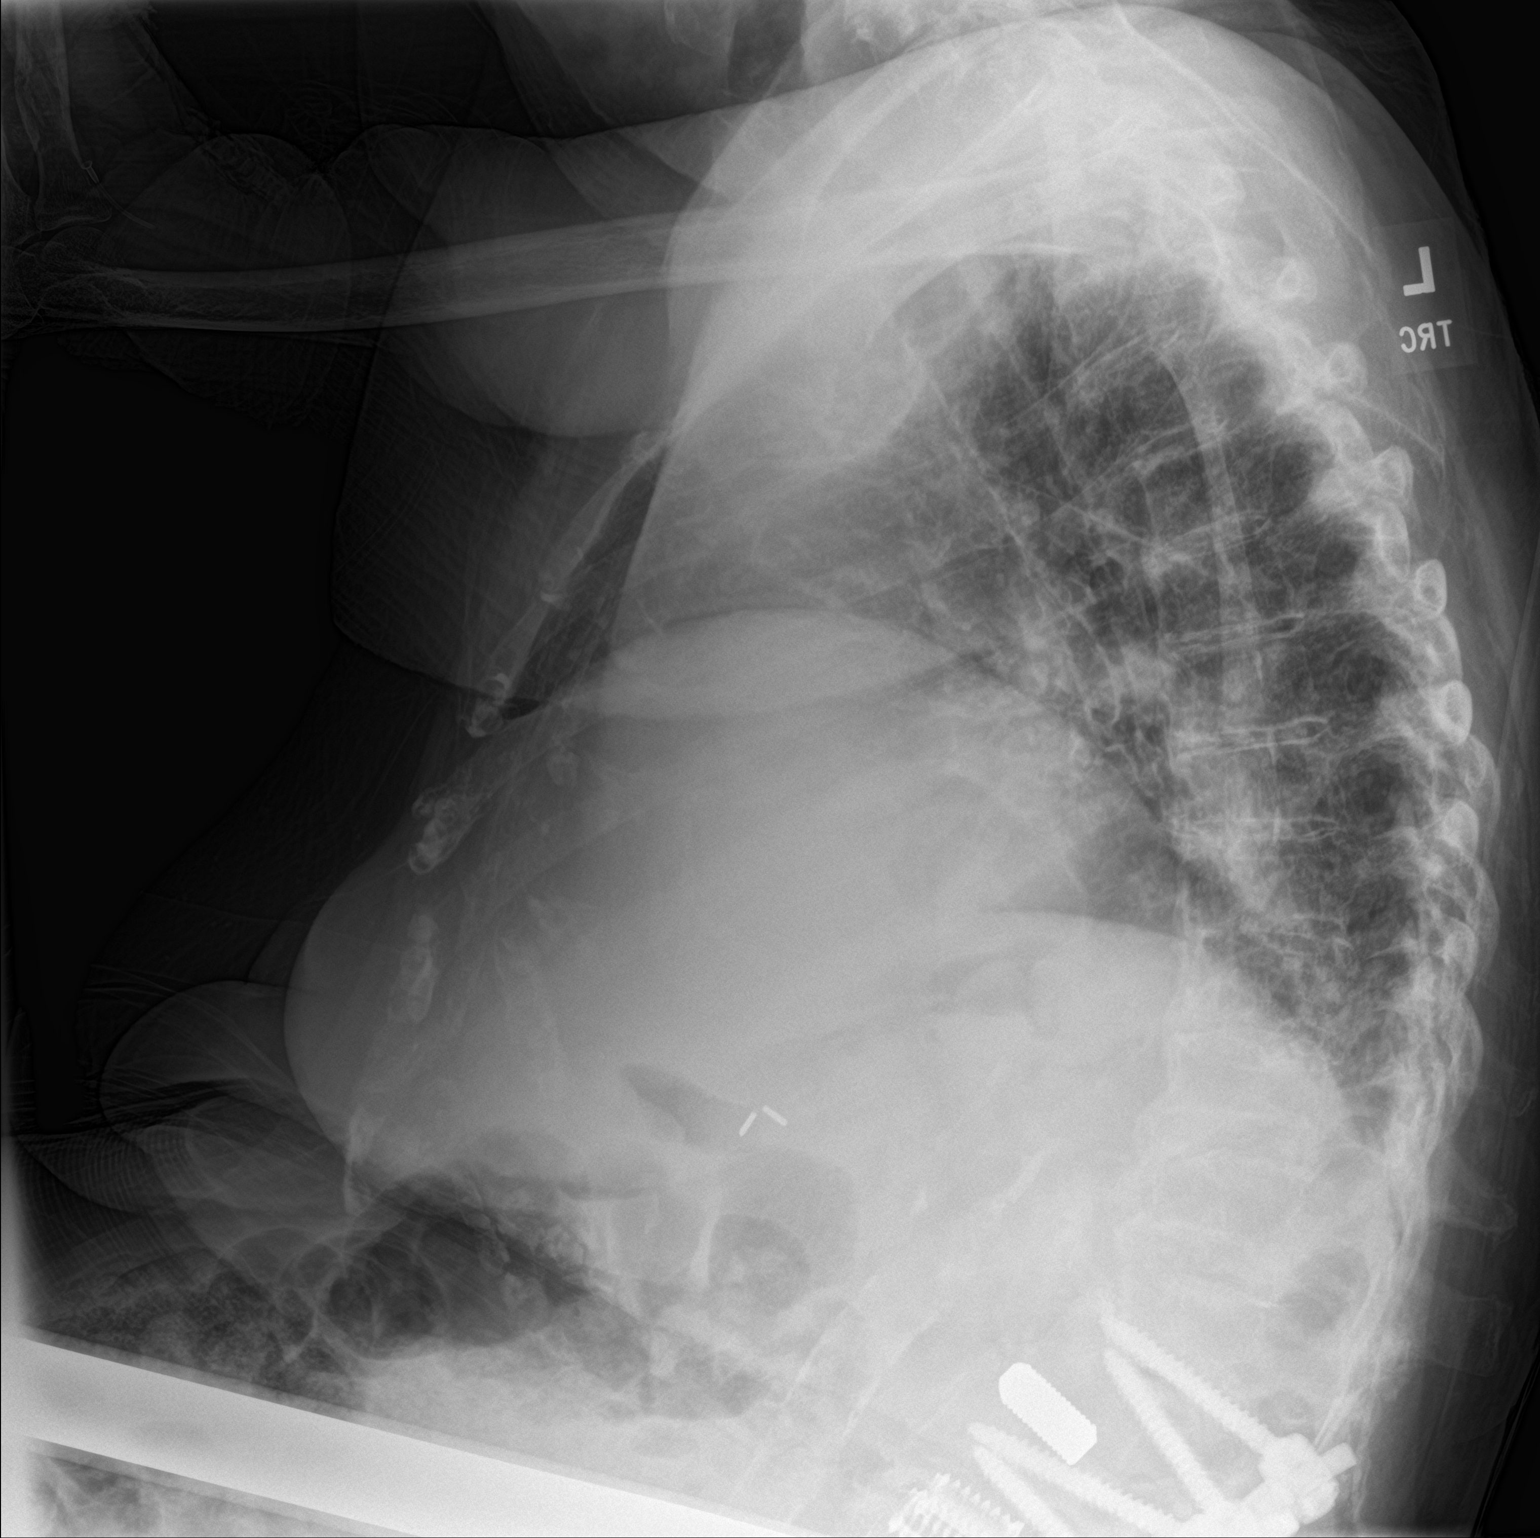
[im 2/2]
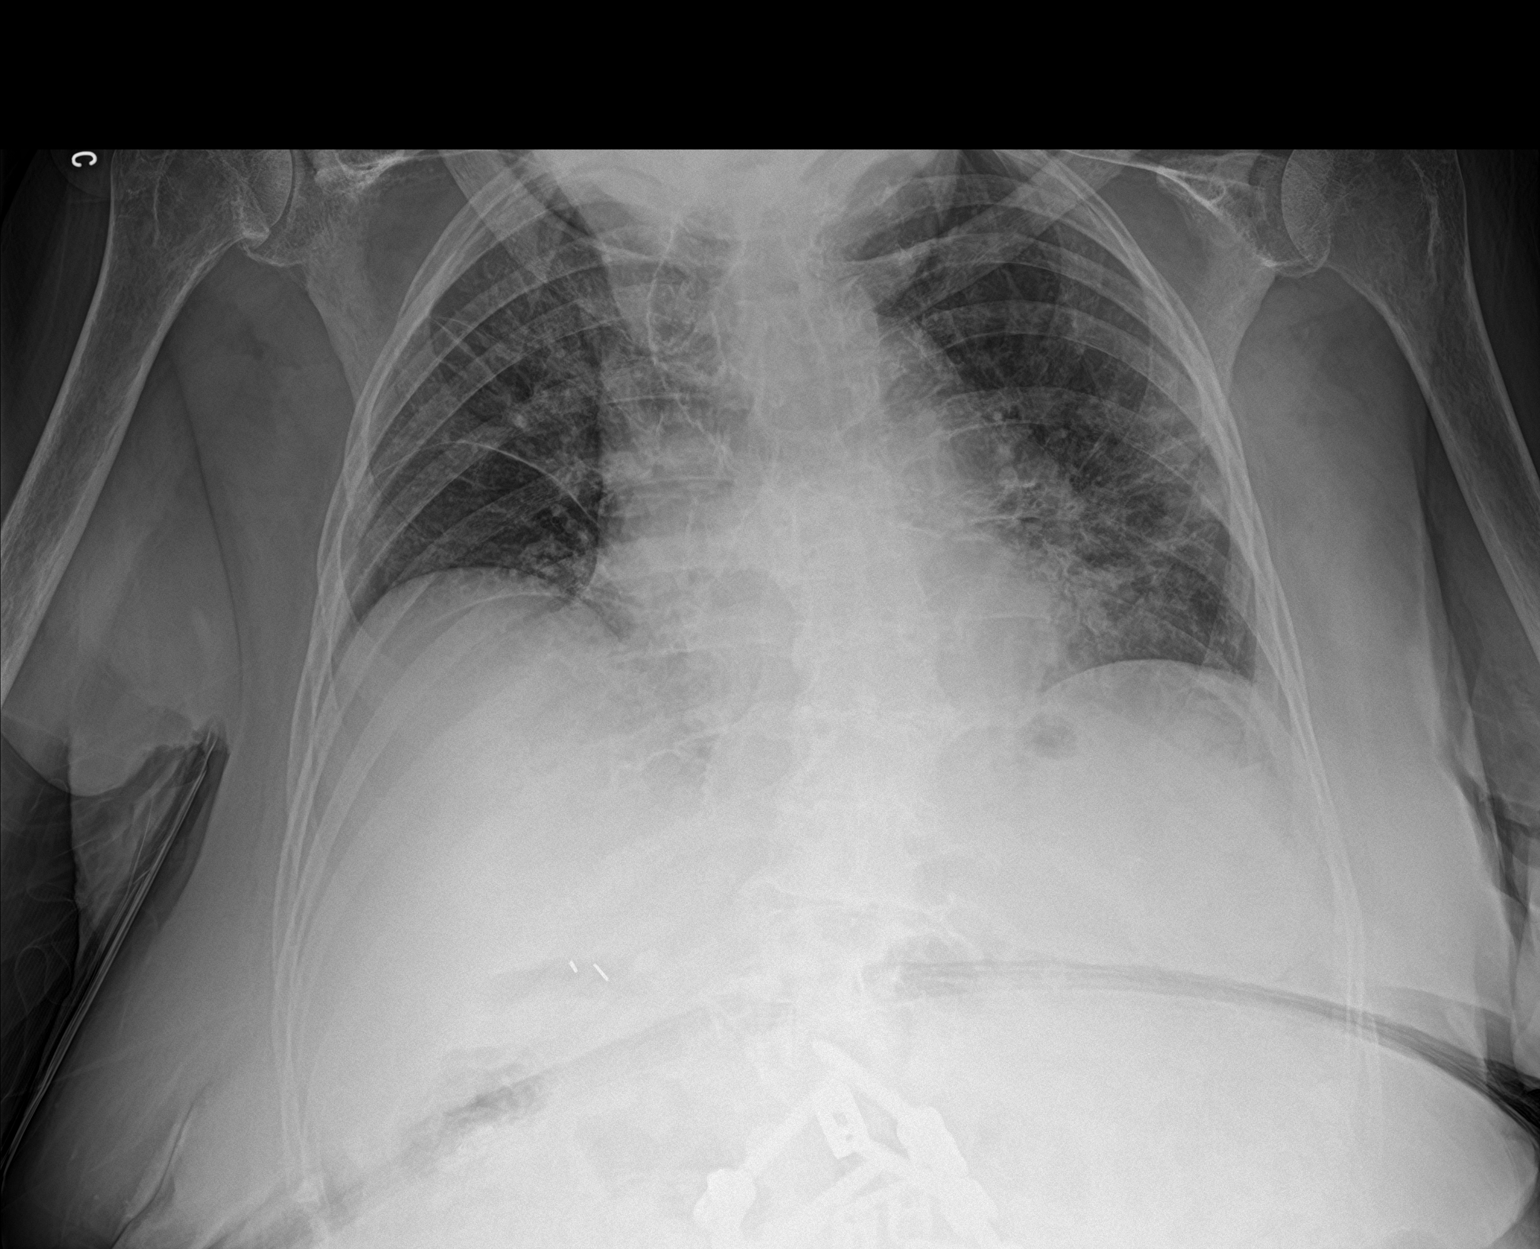

[2 of 2 positions shown; findings below may reference images not displayed]

FINDINGS: Multifocal patchy opacities, left upper lobe predominant, favoring
multifocal pneumonia. No definite pleural effusions. No
pneumothorax.

The heart is normal in size.

Degenerative changes of the visualized thoracolumbar spine. Lumbar
spine fixation hardware.

Cholecystectomy clips.
IMPRESSION: Multifocal patchy opacities, left upper lobe predominant, favoring
multifocal pneumonia.

## 2023-03-26 DIAGNOSIS — L0390 Cellulitis, unspecified: Secondary | ICD-10-CM | POA: Diagnosis not present

## 2023-03-26 DIAGNOSIS — E1122 Type 2 diabetes mellitus with diabetic chronic kidney disease: Secondary | ICD-10-CM | POA: Diagnosis not present

## 2023-03-26 DIAGNOSIS — N1832 Chronic kidney disease, stage 3b: Secondary | ICD-10-CM | POA: Diagnosis not present

## 2023-03-26 DIAGNOSIS — I129 Hypertensive chronic kidney disease with stage 1 through stage 4 chronic kidney disease, or unspecified chronic kidney disease: Secondary | ICD-10-CM | POA: Diagnosis not present

## 2023-03-26 DIAGNOSIS — F03911 Unspecified dementia, unspecified severity, with agitation: Secondary | ICD-10-CM | POA: Diagnosis not present

## 2023-03-26 DIAGNOSIS — E785 Hyperlipidemia, unspecified: Secondary | ICD-10-CM | POA: Diagnosis not present

## 2023-03-26 DIAGNOSIS — F0392 Unspecified dementia, unspecified severity, with psychotic disturbance: Secondary | ICD-10-CM | POA: Diagnosis not present

## 2023-05-27 DIAGNOSIS — F0392 Unspecified dementia, unspecified severity, with psychotic disturbance: Secondary | ICD-10-CM | POA: Diagnosis not present

## 2023-05-27 DIAGNOSIS — F03911 Unspecified dementia, unspecified severity, with agitation: Secondary | ICD-10-CM | POA: Diagnosis not present

## 2023-05-27 DIAGNOSIS — E1122 Type 2 diabetes mellitus with diabetic chronic kidney disease: Secondary | ICD-10-CM | POA: Diagnosis not present

## 2023-05-27 DIAGNOSIS — I129 Hypertensive chronic kidney disease with stage 1 through stage 4 chronic kidney disease, or unspecified chronic kidney disease: Secondary | ICD-10-CM | POA: Diagnosis not present

## 2023-05-27 DIAGNOSIS — N1832 Chronic kidney disease, stage 3b: Secondary | ICD-10-CM | POA: Diagnosis not present

## 2023-05-27 DIAGNOSIS — E785 Hyperlipidemia, unspecified: Secondary | ICD-10-CM | POA: Diagnosis not present

## 2023-05-27 DIAGNOSIS — L0390 Cellulitis, unspecified: Secondary | ICD-10-CM | POA: Diagnosis not present

## 2023-07-01 DIAGNOSIS — L0390 Cellulitis, unspecified: Secondary | ICD-10-CM | POA: Diagnosis not present

## 2023-07-01 DIAGNOSIS — N1832 Chronic kidney disease, stage 3b: Secondary | ICD-10-CM | POA: Diagnosis not present

## 2023-07-01 DIAGNOSIS — I129 Hypertensive chronic kidney disease with stage 1 through stage 4 chronic kidney disease, or unspecified chronic kidney disease: Secondary | ICD-10-CM | POA: Diagnosis not present

## 2023-07-01 DIAGNOSIS — E1122 Type 2 diabetes mellitus with diabetic chronic kidney disease: Secondary | ICD-10-CM | POA: Diagnosis not present

## 2023-07-01 DIAGNOSIS — F0392 Unspecified dementia, unspecified severity, with psychotic disturbance: Secondary | ICD-10-CM | POA: Diagnosis not present

## 2023-07-01 DIAGNOSIS — F03911 Unspecified dementia, unspecified severity, with agitation: Secondary | ICD-10-CM | POA: Diagnosis not present

## 2023-07-01 DIAGNOSIS — E785 Hyperlipidemia, unspecified: Secondary | ICD-10-CM | POA: Diagnosis not present

## 2023-07-10 DIAGNOSIS — E1122 Type 2 diabetes mellitus with diabetic chronic kidney disease: Secondary | ICD-10-CM | POA: Diagnosis not present

## 2023-07-10 DIAGNOSIS — F0390 Unspecified dementia without behavioral disturbance: Secondary | ICD-10-CM | POA: Diagnosis not present

## 2023-07-10 DIAGNOSIS — F03911 Unspecified dementia, unspecified severity, with agitation: Secondary | ICD-10-CM | POA: Diagnosis not present

## 2023-07-10 DIAGNOSIS — N1832 Chronic kidney disease, stage 3b: Secondary | ICD-10-CM | POA: Diagnosis not present

## 2023-07-10 DIAGNOSIS — F0392 Unspecified dementia, unspecified severity, with psychotic disturbance: Secondary | ICD-10-CM | POA: Diagnosis not present

## 2023-07-10 DIAGNOSIS — E785 Hyperlipidemia, unspecified: Secondary | ICD-10-CM | POA: Diagnosis not present

## 2023-07-10 DIAGNOSIS — I129 Hypertensive chronic kidney disease with stage 1 through stage 4 chronic kidney disease, or unspecified chronic kidney disease: Secondary | ICD-10-CM | POA: Diagnosis not present

## 2023-08-29 ENCOUNTER — Encounter: Payer: Self-pay | Admitting: Dermatology

## 2023-08-29 ENCOUNTER — Ambulatory Visit: Payer: PPO | Admitting: Dermatology

## 2023-08-29 DIAGNOSIS — Z7189 Other specified counseling: Secondary | ICD-10-CM | POA: Diagnosis not present

## 2023-08-29 DIAGNOSIS — I872 Venous insufficiency (chronic) (peripheral): Secondary | ICD-10-CM

## 2023-08-29 DIAGNOSIS — L929 Granulomatous disorder of the skin and subcutaneous tissue, unspecified: Secondary | ICD-10-CM | POA: Diagnosis not present

## 2023-08-29 DIAGNOSIS — L820 Inflamed seborrheic keratosis: Secondary | ICD-10-CM

## 2023-08-29 DIAGNOSIS — Z85828 Personal history of other malignant neoplasm of skin: Secondary | ICD-10-CM

## 2023-08-29 MED ORDER — TRIAMCINOLONE ACETONIDE 0.1 % EX OINT: TOPICAL_OINTMENT | CUTANEOUS | 2 refills | Status: AC

## 2023-08-29 NOTE — Progress Notes (Signed)
Follow-Up Visit   Subjective  Crystal Delacruz is a 89 y.o. female who presents for the following: Spots on right cheek, right forearm, and right lower leg. Raised, rough. States these areas are not tender, denies itching. Patient picks at and does not like how they look.  Areas on legs take a long time to heal.   The patient has spots, moles and lesions to be evaluated, some may be new or changing and the patient may have concern these could be cancer.  Husband is with patient and contributes to history.  This patient is accompanied in the office by her  son, Jorja Loa .   The following portions of the chart were reviewed this encounter and updated as appropriate: medications, allergies, medical history  Review of Systems:  No other skin or systemic complaints except as noted in HPI or Assessment and Plan.  Objective  Well appearing patient in no apparent distress; mood and affect are within normal limits.  A focused examination was performed of the following areas: Face, arms, legs  Relevant physical exam findings are noted in the Assessment and Plan.  Right Cheek x1, R forearm x3 (4) Erythematous keratotic or waxy stuck-on papule or plaque.    Assessment & Plan   Inflamed seborrheic keratosis (4) Right Cheek x1, R forearm x3  Symptomatic, irritating, patient would like treated. Extensively discussed that lesions are benign and treatment is not required. Do not want to cause undue suffering with the pain of cryotherapy. Patient and family request cryotherapy.  Destruction of lesion - Right Cheek x1, R forearm x3 (4) Complexity: simple   Destruction method: cryotherapy   Informed consent: discussed and consent obtained   Timeout:  patient name, date of birth, surgical site, and procedure verified Lesion destroyed using liquid nitrogen: Yes   Region frozen until ice ball extended beyond lesion: Yes   Cryo cycles: 1 or 2. Outcome: patient tolerated procedure well with no  complications   Post-procedure details: wound care instructions given   Additional details:  Prior to procedure, discussed risks of blister formation, small wound, skin dyspigmentation, or rare scar following cryotherapy. Recommend Vaseline ointment to treated areas while healing.   Stasis dermatitis of both legs  Granulation tissue  Counseling and coordination of care  HISTORY OF SQUAMOUS CELL CARCINOMA OF THE SKIN - No evidence of recurrence today - No lymphadenopathy - Recommend regular full body skin exams - Recommend daily broad spectrum sunscreen SPF 30+ to sun-exposed areas, reapply every 2 hours as needed.  - Call if any new or changing lesions are noted between office visits  VENOUS STASIS DERMATITIS with hypertrophic granulation tissue Exam: Erythematous, confluent scaly edematous plaques of bilateral and distal lower legs. Left lower leg has papule of hypertrophic granulation tissue  Chronic and persistent condition with duration or expected duration over one year. Condition is bothersome/symptomatic for patient. Currently flared.   Stasis in the legs causes chronic leg swelling, which may result in itchy or painful rashes, skin discoloration, skin texture changes, and sometimes ulceration.  Recommend daily graduated compression hose/stockings- easiest to put on first thing in morning, remove at bedtime.  Elevate legs as much as possible. Avoid salt/sodium rich foods.  Treatment Plan: Start Triamcinolone 0.1% ointment twice daily to lower legs until improved.  Compression socks stockings or wraps daily as tolerated  Avoid applying to face, groin, and axilla. Use as directed. Long-term use can cause thinning of the skin.   Topical steroids (such as triamcinolone, fluocinolone, fluocinonide,  mometasone, clobetasol, halobetasol, betamethasone, hydrocortisone) can cause thinning and lightening of the skin if they are used for too long in the same area. Your physician has  selected the right strength medicine for your problem and area affected on the body. Please use your medication only as directed by your physician to prevent side effects.       Return if symptoms worsen or fail to improve.  I, Lawson Radar, CMA, am acting as scribe for Elie Goody, MD.   Documentation: I have reviewed the above documentation for accuracy and completeness, and I agree with the above.  Elie Goody, MD

## 2023-08-29 NOTE — Patient Instructions (Addendum)
Cryotherapy Aftercare  Wash gently with soap and water everyday.   Apply Vaseline Jelly daily until healed.     Start Triamcinolone 0.1% ointment twice daily to lower legs until improved.   Avoid applying to face, groin, and axilla. Use as directed. Long-term use can cause thinning of the skin  Topical steroids (such as triamcinolone, fluocinolone, fluocinonide, mometasone, clobetasol, halobetasol, betamethasone, hydrocortisone) can cause thinning and lightening of the skin if they are used for too long in the same area. Your physician has selected the right strength medicine for your problem and area affected on the body. Please use your medication only as directed by your physician to prevent side effects.    Recommend daily broad spectrum sunscreen SPF 30+ to sun-exposed areas, reapply every 2 hours as needed. Call for new or changing lesions.  Staying in the shade or wearing long sleeves, sun glasses (UVA+UVB protection) and wide brim hats (4-inch brim around the entire circumference of the hat) are also recommended for sun protection.     Due to recent changes in healthcare laws, you may see results of your pathology and/or laboratory studies on MyChart before the doctors have had a chance to review them. We understand that in some cases there may be results that are confusing or concerning to you. Please understand that not all results are received at the same time and often the doctors may need to interpret multiple results in order to provide you with the best plan of care or course of treatment. Therefore, we ask that you please give [place] 2 business days to thoroughly review all your results before contacting the office for clarification. Should we see a critical lab result, you will be contacted sooner.   If You Need Anything After Your Visit  If you have any questions or concerns for your doctor, please call our main line at [phone] and press option 4 to reach your doctor's  medical assistant. If no one answers, please leave a voicemail as directed and we will return your call as soon as possible. Messages left after 4 pm will be answered the following business day.   You may also send [place] a message via MyChart. We typically respond to MyChart messages within 1-2 business days.  For prescription refills, please ask your pharmacy to contact our office. Our fax number is [phone].  If you have an urgent issue when the clinic is closed that cannot wait until the next business day, you can page your doctor at the number below.    Please note that while we do our best to be available for urgent issues outside of office hours, we are not available 24/7.   If you have an urgent issue and are unable to reach [place], you may choose to seek medical care at your doctor's office, retail clinic, urgent care center, or emergency room.  If you have a medical emergency, please immediately call 911 or go to the emergency department.  Pager Numbers  - Dr. Gwen Pounds: [phone]  - Dr. Roseanne Reno: [phone]  - Dr. Katrinka Blazing: [phone]   In the event of inclement weather, please call our main line at [phone] for an update on the status of any delays or closures.  Dermatology Medication Tips: Please keep the boxes that topical medications come in in order to help keep track of the instructions about where and how to use these. Pharmacies typically print the medication instructions only on the boxes and not directly on the medication tubes.  If your medication is too expensive, please contact our office at [phone] option 4 or send [place] a message through MyChart.   We are unable to tell what your co-pay for medications will be in advance as this is different depending on your insurance coverage. However, we may be able to find a substitute medication at lower cost or fill out paperwork to get insurance to cover a needed medication.   If a prior authorization is required  to get your medication covered by your insurance company, please allow [place] 1-2 business days to complete this process.  Drug prices often vary depending on where the prescription is filled and some pharmacies may offer cheaper prices.  The website www.goodrx.com contains coupons for medications through different pharmacies. The prices here do not account for what the cost may be with help from insurance (it may be cheaper with your insurance), but the website can give you the price if you did not use any insurance.  - You can print the associated coupon and take it with your prescription to the pharmacy.  - You may also stop by our office during regular business hours and pick up a GoodRx coupon card.  - If you need your prescription sent electronically to a different pharmacy, notify our office through [facility] or by phone at [phone] option 4.     Si Usted Necesita Algo Despus de Su Visita  Tambin puede enviarnos un mensaje a travs de Clinical cytogeneticist. Por lo general respondemos a los mensajes de MyChart en el transcurso de 1 a 2 das hbiles.  Para renovar recetas, por favor pida a su farmacia que se ponga en contacto con nuestra oficina. Annie Sable de fax es [place] [phone].  Si tiene un asunto urgente cuando la clnica est cerrada y que no puede esperar hasta el siguiente da hbil, puede llamar/localizar a su doctor(a) al nmero que aparece a continuacin.   Por favor, tenga en cuenta que aunque hacemos todo lo posible para estar disponibles para asuntos urgentes fuera del horario de [place], no estamos disponibles las 24 horas del da, los 7 [address]  [address] de la [place].   Si tiene un problema urgente y no puede comunicarse con nosotros, puede optar por buscar atencin mdica  en el consultorio de su doctor(a), en una clnica privada, en un centro de atencin urgente o en una sala de emergencias.  Si tiene Engineer, drilling, por favor llame inmediatamente al 911 o vaya a la sala  de emergencias.  Nmeros de bper  - Dr. Gwen Pounds: [phone]  - Dra. Roseanne Reno: [phone]  - Dr. Katrinka Blazing: [phone]   En caso de inclemencias del tiempo, por favor llame a Lacy Duverney principal al [phone] para una actualizacin sobre el [place] de cualquier retraso o cierre.  Consejos para la medicacin en dermatologa: Por favor, guarde las cajas en las que vienen los medicamentos de uso tpico para ayudarle a seguir las instrucciones sobre dnde y cmo usarlos. Las farmacias generalmente imprimen las instrucciones del medicamento slo en las cajas y no directamente en los tubos del [place].   Si su medicamento es muy caro, por favor, pngase en contacto con Rolm Gala llamando al [phone] y presione la opcin 4 o envenos un mensaje a travs de Clinical cytogeneticist.   No podemos decirle cul ser su copago por los medicamentos por adelantado ya que esto es diferente dependiendo de la cobertura de su seguro. Sin embargo, es posible que podamos encontrar un medicamento sustituto a Audiological scientist  un formulario para que el seguro cubra el medicamento que se considera necesario.   Si se requiere una autorizacin previa para que su compaa de seguros [place] su medicamento, por favor permtanos de 1 a 2 das hbiles para completar [address]  Los precios de los medicamentos varan con frecuencia dependiendo del Environmental consultant de dnde se surte la receta y alguna farmacias pueden ofrecer precios ms baratos.  El sitio web www.goodrx.com tiene cupones para medicamentos de Health and safety inspector. Los precios aqu no tienen en cuenta lo que podra costar con la ayuda del seguro (puede ser ms barato con su seguro), pero el sitio web puede darle el precio si no utiliz Tourist information centre manager.  - Puede imprimir el cupn correspondiente y llevarlo con su receta a la farmacia.  - Tambin puede pasar por nuestra oficina durante el horario de atencin regular y Education officer, museum una tarjeta de cupones de GoodRx.  -  Si necesita que su receta se enve electrnicamente a una farmacia diferente, informe a nuestra oficina a travs de MyChart de [place] o por telfono llamando al [phone] y presione la opcin 4.

## 2023-09-22 NOTE — Progress Notes (Unsigned)
Cardiology Office Note  Date:  09/23/2023   ID:  Crystal Delacruz, DOB 05/30/1924, MRN [id number]  PCP:  Lauro Regulus, MD   Chief Complaint  Patient presents with   6 month follow up     Patient c/o bilateral LE edema & shortness of breath at times. Medications reviewed by the patient verbally.     HPI:  Ms. 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 office for her cardiac  disease/hypertension, three-vessel coronary calcification, mild diffuse aortic atherosclerosis, atrial fibrillation  Last seen by myself in clinic May 2024  Presents today in a wheelchair with husband and other family  Husband reports that she is under care of hospice, for >1 year Minimal walking in the house Requires assistance for ambulation  able to stand and pivot or walk a few steps Sleeps much of the day, 18 hrs a day  No Recent lab work available  Chronic mild lower extremity edema Takes torsemide 60 mg daily No longer on metoprolol  Lab work reviewed A1c 6.9 Total cholesterol 175 LDL 95  EKG personally reviewed by myself on todays visit EKG Interpretation Date/Time:  Monday September 23 2023 14:08:56 EST Ventricular Rate:  77 PR Interval:    QRS Duration:  70 QT Interval:  406 QTC Calculation: 459 R Axis:   30  Text Interpretation: Atrial fibrillation Low voltage QRS Cannot rule out Anterior infarct (cited on or before 19-Jan-2022) When compared with ECG of 19-Jan-2022 11:49, rhyhm has changed Confirmed by Julien Nordmann  [phone]) on 09/23/2023 2:33:16 PM    echocardiogram March 2023  revealed normal LV function with no significant valvular structural abnormalities  Mildly elevated right heart pressures  CT scan chest December 05, 2021 Images pulled up and reviewed,  mild diffuse aortic atherosclerosis, mild three-vessel coronary calcification  EKG personally reviewed by myself on todays visit  PMH:   has a past medical history of Arthritis, Asthma, Diabetes mellitus without complication (HCC), Hypertension, Hypothyroidism, Squamous cell carcinoma of skin (09/29/2018), Squamous cell carcinoma of skin (06/21/2021), Squamous cell carcinoma of skin (12/19/2020), Squamous cell carcinoma of skin (06/21/2021), Squamous cell carcinoma of skin (06/21/2021), and Squamous cell carcinoma of skin (03/22/2022).  PSH:    Past Surgical History:  Procedure Laterality Date   none      Current Outpatient Medications  Medication Sig Dispense Refill   acetaminophen (TYLENOL) 500 MG tablet Take 500 mg by mouth every 6 (six) hours as needed.     albuterol (VENTOLIN HFA) 108 (90 Base) MCG/ACT inhaler INHALE 2 INHALATIONS BY MOUTH INTO THE LUNGS EVERY 4 HOURS AS NEEDED FOR WHEEZING OR SHORTNESS OF BREATH     aspirin EC 81 MG tablet Take 81 mg by mouth daily.     bisacodyl (DULCOLAX) 10 MG suppository Place 1 suppository (10 mg total) rectally daily as needed for moderate constipation. 12 suppository 0   CIPRO 250 MG tablet Take 250 mg by mouth 2 (two) times daily.     feeding supplement, GLUCERNA SHAKE, (GLUCERNA SHAKE) LIQD Take 237 mLs by mouth 3 (three) times daily between meals. 1000 mL  12   gabapentin (NEURONTIN) 100 MG capsule Take 200 mg by mouth 2 (two) times daily.     glimepiride (AMARYL) 4 MG tablet Take 4 mg by mouth in the morning and at bedtime.     LANTUS SOLOSTAR 100 UNIT/ML Solostar Pen Inject 15 Units into the skin daily.     levothyroxine (SYNTHROID, LEVOTHROID) 50 MCG tablet Take 50 mcg by mouth daily.      loratadine (CLARITIN) 10 MG tablet Take 10 mg by mouth daily.     ondansetron (ZOFRAN) 4 MG tablet Take 1 tablet (4 mg total) by mouth every 6 (six) hours as needed for nausea. 20 tablet 0   QUEtiapine (SEROQUEL) 25 MG tablet Take 25 mg by mouth 3 (three) times daily.     torsemide (DEMADEX) 20 MG tablet Take 60 mg by mouth daily.     triamcinolone ointment (KENALOG) 0.1 % Apply twice daily to lower legs until improved as needed. Avoid applying to face, groin, and axilla 240 g 2   oxyCODONE (OXY IR/ROXICODONE) 5 MG immediate release tablet Take 1 tablet (5 mg total) by mouth every 4 (four) hours as needed for moderate pain. (Patient not taking: Reported on 09/23/2023) 30 tablet 0   QUEtiapine (SEROQUEL) 25 MG tablet Take 1 tablet (25 mg total) by mouth at bedtime. (Patient not taking: Reported on 09/23/2023) 30 tablet 1   No current facility-administered medications for this visit.    Allergies:   Celecoxib, Ciprofloxacin, Elemental sulfur, Latex, Morphine and codeine, and Raloxifene   Social History:  The patient  reports that she has never smoked. She has never used smokeless tobacco. She reports that she does not drink alcohol and does not use drugs.   Family History:   family history is not on file.    Review of Systems: Review of Systems  Constitutional: Negative.   HENT: Negative.    Respiratory: Negative.    Cardiovascular: Negative.   Gastrointestinal: Negative.   Musculoskeletal: Negative.   Neurological: Negative.   Psychiatric/Behavioral: Negative.    All other systems reviewed and are negative.  PHYSICAL EXAM: VS:  Ht 5\' 9"  (1.753 m)   Wt 173 lb 4 oz (78.6 kg)   SpO2 90%   BMI 25.58 kg/m  , BMI Body mass index is 25.58 kg/m. GEN: Well nourished, well developed, in no acute distress, in a wheelchair HEENT: normal Neck: no JVD, carotid bruits, or masses Cardiac: RRR; no murmurs, rubs, or gallops,no edema  Respiratory:  clear to auscultation bilaterally, normal work  of breathing GI: soft, nontender, nondistended, + BS MS: no deformity or atrophy Skin: warm and dry, no rash Neuro:  Strength and sensation are intact Psych: euthymic mood, full affect  Recent Labs: No results found for requested labs within last 365 days.    Lipid Panel No results found for: "CHOL", "HDL", "LDLCALC", "TRIG"    Wt Readings from Last 3 Encounters:  09/23/23 173 lb 4 oz (78.6 kg)  03/18/23 175 lb (79.4 kg)  01/19/22 182 lb 8.7 oz (82.8 kg)     ASSESSMENT AND PLAN:  Problem List Items Addressed This Visit       Cardiology Problems   Hyperlipidemia     Other   CKD (chronic kidney disease), stage III (HCC)   Other Visit Diagnoses     Coronary artery calcification    -  Primary   Relevant Orders   EKG 12-Lead   Aortic atherosclerosis (HCC)       Relevant Orders  EKG 12-Lead   Myalgia due to statin       Diabetes mellitus type 2 with complications [facility])       Essential hypertension       Relevant Orders   EKG 12-Lead   Mild dilation of ascending aorta (HCC)       Relevant Orders   EKG 12-Lead       Coronary artery calcification/aortic atherosclerosis Currently with no symptoms of angina. No further workup at this time. Continue current medication regimen.  Age and dementia would preclude additional ischemic workup Currently not on a statin, notes indicating statin myalgia  Mild dilation of the aorta Previously noted on CT scan, stable 4.2 cm No further workup needed given age, comorbidities Not a surgical candidate  Diabetes type 2 with complications No recent lab work available Prior A1c 6.9,  on glimepiride Weight continues to trend downward  Essential hypertension Blood pressure is well controlled on today's visit. No changes made to the medications.  Chronic lower extremity swelling Likely component of venous insufficiency, dependent edema On torsemide 60 daily Managed by hospice  Atrial fibrillation Significant baseline  artifact on EKG, unable to exclude normal sinus rhythm with frequent PACs Rhythm suggestive for atrial fibrillation rate controlled Not a good candidate for anticoagulation given fall risk, frail state, age No further workup needed, asymptomatic  Signed, Dossie Arbour, M.D., Ph.D. [facility] [place], [place] [phone]

## 2023-09-23 ENCOUNTER — Ambulatory Visit: Payer: PPO | Attending: Cardiovascular Disease | Admitting: Cardiovascular Disease

## 2023-09-23 ENCOUNTER — Encounter: Payer: Self-pay | Admitting: Cardiovascular Disease

## 2023-09-23 VITALS — BP 120/80 | HR 77 | Ht 69.0 in | Wt 173.2 lb

## 2023-09-23 DIAGNOSIS — E782 Mixed hyperlipidemia: Secondary | ICD-10-CM

## 2023-09-23 DIAGNOSIS — I2510 Atherosclerotic heart disease of native coronary artery without angina pectoris: Secondary | ICD-10-CM

## 2023-09-23 DIAGNOSIS — I77810 Thoracic aortic ectasia: Secondary | ICD-10-CM

## 2023-09-23 DIAGNOSIS — E118 Type 2 diabetes mellitus with unspecified complications: Secondary | ICD-10-CM | POA: Diagnosis not present

## 2023-09-23 DIAGNOSIS — I700 Atherosclerosis of aorta: Secondary | ICD-10-CM

## 2023-09-23 DIAGNOSIS — I10 Essential (primary) hypertension: Secondary | ICD-10-CM

## 2023-09-23 DIAGNOSIS — M791 Myalgia, unspecified site: Secondary | ICD-10-CM | POA: Diagnosis not present

## 2023-09-23 DIAGNOSIS — T466X5D Adverse effect of antihyperlipidemic and antiarteriosclerotic drugs, subsequent encounter: Secondary | ICD-10-CM | POA: Diagnosis not present

## 2023-09-23 DIAGNOSIS — N183 Chronic kidney disease, stage 3 unspecified: Secondary | ICD-10-CM

## 2023-09-23 MED ORDER — TORSEMIDE 20 MG PO TABS: 60.0000 mg | ORAL_TABLET | Freq: Every day | ORAL | 3 refills | Status: DC

## 2023-09-23 NOTE — Patient Instructions (Signed)
Medication Instructions:  Your Physician recommend you continue on your current medication as directed.     *If you need a refill on your cardiac medications before your next appointment, please call your pharmacy*   Lab Work: None ordered.   If you have labs (blood work) drawn today and your tests are completely normal, you will receive your results only by: MyChart Message (if you have MyChart) OR A paper copy in the mail If you have any lab test that is abnormal or we need to change your treatment, we will call you to review the results.   Testing/Procedures: None ordered.    Follow-Up: At [facility], you and your health needs are our priority.  As part of our continuing mission to provide you with exceptional heart care, we have created designated Provider Care Teams.  These Care Teams include your primary Cardiologist (physician) and Advanced Practice Providers (APPs -  Physician Assistants and Nurse Practitioners) who all work together to provide you with the care you need, when you need it.  We recommend signing up for the patient portal called "MyChart".  Sign up information is provided on this After Visit Summary.  MyChart is used to connect with patients for Virtual Visits (Telemedicine).  Patients are able to view lab/test results, encounter notes, upcoming appointments, etc.  Non-urgent messages can be sent to your provider as well.   To learn more about what you can do with MyChart, go to [email or web address].    Your next appointment:   12 month(s)  Provider:   You may see Dr. Mariah Milling or one of the following Advanced Practice Providers on your designated Care Team:   Nicolasa Ducking, NP Eula Listen, PA-C Cadence Fransico Michael, PA-C Charlsie Quest, NP Carlos Levering, NP

## 2024-01-21 ENCOUNTER — Other Ambulatory Visit: Payer: Self-pay | Admitting: Cardiovascular Disease

## 2024-05-22 ENCOUNTER — Other Ambulatory Visit: Payer: Self-pay | Admitting: Cardiovascular Disease
